# Patient Record
Sex: Female | Born: 1950 | Race: Black or African American | Hispanic: No | Marital: Single | Smoking: Former smoker
Health system: Southern US, Community
[De-identification: ages and names within clinical notes are randomized; demographics above are authoritative.]

## PROBLEM LIST (undated history)

## (undated) DIAGNOSIS — I10 Essential (primary) hypertension: Secondary | ICD-10-CM

## (undated) DIAGNOSIS — Z9221 Personal history of antineoplastic chemotherapy: Secondary | ICD-10-CM

## (undated) DIAGNOSIS — E7800 Pure hypercholesterolemia, unspecified: Secondary | ICD-10-CM

## (undated) DIAGNOSIS — F32A Depression, unspecified: Secondary | ICD-10-CM

## (undated) DIAGNOSIS — I509 Heart failure, unspecified: Secondary | ICD-10-CM

## (undated) DIAGNOSIS — I2510 Atherosclerotic heart disease of native coronary artery without angina pectoris: Secondary | ICD-10-CM

## (undated) DIAGNOSIS — E669 Obesity, unspecified: Secondary | ICD-10-CM

## (undated) DIAGNOSIS — C189 Malignant neoplasm of colon, unspecified: Secondary | ICD-10-CM

## (undated) DIAGNOSIS — F329 Major depressive disorder, single episode, unspecified: Secondary | ICD-10-CM

## (undated) DIAGNOSIS — E119 Type 2 diabetes mellitus without complications: Secondary | ICD-10-CM

## (undated) HISTORY — DX: Essential (primary) hypertension: I10

## (undated) HISTORY — DX: Malignant neoplasm of colon, unspecified: C18.9

## (undated) HISTORY — PX: ABDOMINAL HYSTERECTOMY: SHX81

## (undated) HISTORY — DX: Pure hypercholesterolemia, unspecified: E78.00

## (undated) HISTORY — DX: Heart failure, unspecified: I50.9

## (undated) HISTORY — PX: COLECTOMY: SHX59

## (undated) HISTORY — PX: UPPER GI ENDOSCOPY: SHX6162

## (undated) HISTORY — DX: Type 2 diabetes mellitus without complications: E11.9

---

## 2001-04-02 ENCOUNTER — Ambulatory Visit (HOSPITAL_COMMUNITY): Admission: RE | Admit: 2001-04-02 | Discharge: 2001-04-02 | Payer: Self-pay | Admitting: Family Medicine

## 2007-05-30 DIAGNOSIS — Z9221 Personal history of antineoplastic chemotherapy: Secondary | ICD-10-CM

## 2007-05-30 DIAGNOSIS — C189 Malignant neoplasm of colon, unspecified: Secondary | ICD-10-CM

## 2007-05-30 HISTORY — DX: Personal history of antineoplastic chemotherapy: Z92.21

## 2007-05-30 HISTORY — DX: Malignant neoplasm of colon, unspecified: C18.9

## 2007-09-27 ENCOUNTER — Ambulatory Visit: Payer: Self-pay | Admitting: Internal Medicine

## 2007-10-15 ENCOUNTER — Other Ambulatory Visit: Payer: Self-pay

## 2007-10-15 ENCOUNTER — Inpatient Hospital Stay: Payer: Self-pay | Admitting: Vascular Surgery

## 2007-10-28 ENCOUNTER — Ambulatory Visit: Payer: Self-pay | Admitting: Internal Medicine

## 2007-11-06 ENCOUNTER — Ambulatory Visit: Payer: Self-pay | Admitting: Internal Medicine

## 2007-11-18 ENCOUNTER — Ambulatory Visit: Payer: Self-pay | Admitting: Vascular Surgery

## 2007-11-27 ENCOUNTER — Ambulatory Visit: Payer: Self-pay | Admitting: Internal Medicine

## 2007-12-28 ENCOUNTER — Ambulatory Visit: Payer: Self-pay | Admitting: Internal Medicine

## 2008-01-08 ENCOUNTER — Emergency Department: Payer: Self-pay | Admitting: Emergency Medicine

## 2008-01-28 ENCOUNTER — Ambulatory Visit: Payer: Self-pay | Admitting: Internal Medicine

## 2008-02-27 ENCOUNTER — Ambulatory Visit: Payer: Self-pay | Admitting: Internal Medicine

## 2008-03-29 ENCOUNTER — Ambulatory Visit: Payer: Self-pay | Admitting: Internal Medicine

## 2008-04-28 ENCOUNTER — Ambulatory Visit: Payer: Self-pay | Admitting: Internal Medicine

## 2008-05-29 ENCOUNTER — Ambulatory Visit: Payer: Self-pay | Admitting: Internal Medicine

## 2008-06-29 ENCOUNTER — Ambulatory Visit: Payer: Self-pay | Admitting: Internal Medicine

## 2008-07-27 ENCOUNTER — Ambulatory Visit: Payer: Self-pay | Admitting: Internal Medicine

## 2008-07-28 ENCOUNTER — Ambulatory Visit: Payer: Self-pay | Admitting: Vascular Surgery

## 2008-08-04 ENCOUNTER — Inpatient Hospital Stay: Payer: Self-pay | Admitting: Vascular Surgery

## 2008-08-27 ENCOUNTER — Ambulatory Visit: Payer: Self-pay | Admitting: Internal Medicine

## 2008-09-09 ENCOUNTER — Ambulatory Visit: Payer: Self-pay | Admitting: Family Medicine

## 2008-09-21 ENCOUNTER — Ambulatory Visit: Payer: Self-pay | Admitting: Internal Medicine

## 2008-09-26 ENCOUNTER — Ambulatory Visit: Payer: Self-pay | Admitting: Family Medicine

## 2008-09-26 ENCOUNTER — Ambulatory Visit: Payer: Self-pay | Admitting: Internal Medicine

## 2008-10-27 ENCOUNTER — Ambulatory Visit: Payer: Self-pay | Admitting: Internal Medicine

## 2008-11-26 ENCOUNTER — Ambulatory Visit: Payer: Self-pay | Admitting: Internal Medicine

## 2008-12-03 ENCOUNTER — Ambulatory Visit: Payer: Self-pay | Admitting: Internal Medicine

## 2008-12-27 ENCOUNTER — Ambulatory Visit: Payer: Self-pay | Admitting: Internal Medicine

## 2009-01-27 ENCOUNTER — Ambulatory Visit: Payer: Self-pay | Admitting: Internal Medicine

## 2009-02-26 ENCOUNTER — Emergency Department: Payer: Self-pay

## 2009-02-26 ENCOUNTER — Ambulatory Visit: Payer: Self-pay | Admitting: Internal Medicine

## 2009-03-15 ENCOUNTER — Emergency Department: Payer: Self-pay | Admitting: Emergency Medicine

## 2009-03-29 ENCOUNTER — Ambulatory Visit: Payer: Self-pay | Admitting: Internal Medicine

## 2009-04-28 ENCOUNTER — Ambulatory Visit: Payer: Self-pay | Admitting: Internal Medicine

## 2009-06-01 ENCOUNTER — Ambulatory Visit: Payer: Self-pay | Admitting: Internal Medicine

## 2009-06-23 ENCOUNTER — Ambulatory Visit: Payer: Self-pay | Admitting: Gastroenterology

## 2009-06-29 ENCOUNTER — Ambulatory Visit: Payer: Self-pay | Admitting: Internal Medicine

## 2009-07-13 ENCOUNTER — Ambulatory Visit: Payer: Self-pay

## 2009-07-27 ENCOUNTER — Ambulatory Visit: Payer: Self-pay | Admitting: Internal Medicine

## 2009-08-27 ENCOUNTER — Ambulatory Visit: Payer: Self-pay | Admitting: Internal Medicine

## 2009-09-26 ENCOUNTER — Ambulatory Visit: Payer: Self-pay | Admitting: Internal Medicine

## 2009-10-27 ENCOUNTER — Ambulatory Visit: Payer: Self-pay | Admitting: Internal Medicine

## 2009-11-26 ENCOUNTER — Ambulatory Visit: Payer: Self-pay | Admitting: Internal Medicine

## 2009-12-27 ENCOUNTER — Ambulatory Visit: Payer: Self-pay | Admitting: Internal Medicine

## 2010-01-19 LAB — CEA: CEA: 6.8 ng/mL — ABNORMAL HIGH (ref 0.0–4.7)

## 2010-01-27 ENCOUNTER — Ambulatory Visit: Payer: Self-pay | Admitting: Internal Medicine

## 2010-02-21 ENCOUNTER — Ambulatory Visit: Payer: Self-pay | Admitting: Internal Medicine

## 2010-02-23 LAB — CEA: CEA: 6.3 ng/mL — ABNORMAL HIGH (ref 0.0–4.7)

## 2010-02-26 ENCOUNTER — Ambulatory Visit: Payer: Self-pay | Admitting: Internal Medicine

## 2010-03-23 LAB — CEA: CEA: 7.2 ng/mL — ABNORMAL HIGH (ref 0.0–4.7)

## 2010-03-29 ENCOUNTER — Ambulatory Visit: Payer: Self-pay | Admitting: Internal Medicine

## 2010-04-28 ENCOUNTER — Ambulatory Visit: Payer: Self-pay | Admitting: Internal Medicine

## 2010-05-15 LAB — CEA: CEA: 7.2 ng/mL — ABNORMAL HIGH (ref 0.0–4.7)

## 2010-05-29 ENCOUNTER — Ambulatory Visit: Payer: Self-pay | Admitting: Internal Medicine

## 2010-06-29 ENCOUNTER — Ambulatory Visit: Payer: Self-pay | Admitting: Internal Medicine

## 2010-07-15 ENCOUNTER — Ambulatory Visit: Payer: Self-pay | Admitting: Internal Medicine

## 2010-07-16 LAB — CEA: CEA: 6.6 ng/mL — ABNORMAL HIGH (ref 0.0–4.7)

## 2010-07-18 ENCOUNTER — Ambulatory Visit: Payer: Self-pay | Admitting: Family Medicine

## 2010-07-28 ENCOUNTER — Ambulatory Visit: Payer: Self-pay | Admitting: Internal Medicine

## 2010-08-28 ENCOUNTER — Ambulatory Visit: Payer: Self-pay | Admitting: Internal Medicine

## 2010-09-11 LAB — CEA: CEA: 7.8 ng/mL — ABNORMAL HIGH (ref 0.0–4.7)

## 2010-09-27 ENCOUNTER — Ambulatory Visit: Payer: Self-pay | Admitting: Internal Medicine

## 2010-10-28 ENCOUNTER — Ambulatory Visit: Payer: Self-pay | Admitting: Internal Medicine

## 2010-10-31 ENCOUNTER — Ambulatory Visit: Payer: Self-pay | Admitting: Gastroenterology

## 2010-11-01 ENCOUNTER — Ambulatory Visit: Payer: Self-pay | Admitting: Internal Medicine

## 2010-11-27 ENCOUNTER — Ambulatory Visit: Payer: Self-pay | Admitting: Internal Medicine

## 2010-12-14 ENCOUNTER — Inpatient Hospital Stay: Payer: Self-pay | Admitting: Internal Medicine

## 2010-12-17 ENCOUNTER — Ambulatory Visit: Payer: Self-pay | Admitting: Internal Medicine

## 2010-12-28 ENCOUNTER — Ambulatory Visit: Payer: Self-pay | Admitting: Internal Medicine

## 2010-12-29 LAB — CEA: CEA: 7.9 ng/mL — ABNORMAL HIGH (ref 0.0–4.7)

## 2011-01-28 ENCOUNTER — Ambulatory Visit: Payer: Self-pay | Admitting: Internal Medicine

## 2011-02-09 LAB — CEA: CEA: 7.2 ng/mL — ABNORMAL HIGH (ref 0.0–4.7)

## 2011-02-27 ENCOUNTER — Ambulatory Visit: Payer: Self-pay | Admitting: Internal Medicine

## 2011-03-30 ENCOUNTER — Ambulatory Visit: Payer: Self-pay | Admitting: Internal Medicine

## 2011-04-14 LAB — CEA: CEA: 5.7 ng/mL — ABNORMAL HIGH (ref 0.0–4.7)

## 2011-04-29 ENCOUNTER — Ambulatory Visit: Payer: Self-pay | Admitting: Internal Medicine

## 2011-05-30 ENCOUNTER — Ambulatory Visit: Payer: Self-pay | Admitting: Internal Medicine

## 2011-06-12 LAB — POTASSIUM: Potassium: 3.5 mmol/L (ref 3.5–5.1)

## 2011-06-13 LAB — CEA: CEA: 5.8 ng/mL — ABNORMAL HIGH (ref 0.0–4.7)

## 2011-06-30 ENCOUNTER — Ambulatory Visit: Payer: Self-pay | Admitting: Internal Medicine

## 2011-07-17 LAB — CREATININE, SERUM
EGFR (African American): >60
EGFR (Non-African Amer.): >60

## 2011-07-17 LAB — HEPATIC FUNCTION PANEL A (ARMC)
Albumin: 3.6 g/dL (ref 3.4–5.0)
Bilirubin, Direct: <0.1 mg/dL (ref 0.00–0.20)
Bilirubin,Total: 0.4 mg/dL (ref 0.2–1.0)
SGOT(AST): 43 U/L — ABNORMAL HIGH (ref 15–37)
SGPT (ALT): 68 U/L

## 2011-07-28 ENCOUNTER — Ambulatory Visit: Payer: Self-pay | Admitting: Internal Medicine

## 2011-08-14 LAB — POTASSIUM: Potassium: 3.5 mmol/L (ref 3.5–5.1)

## 2011-08-25 ENCOUNTER — Inpatient Hospital Stay: Payer: Self-pay | Admitting: Specialist

## 2011-08-25 LAB — CBC
HCT: 33.1 % — ABNORMAL LOW (ref 35.0–47.0)
HGB: 11 g/dL — ABNORMAL LOW (ref 12.0–16.0)
MCH: 25.2 pg — ABNORMAL LOW (ref 26.0–34.0)
MCHC: 33.7 g/dL (ref 32.0–36.0)
MCHC: 33.2 g/dL (ref 32.0–36.0)
Platelet: 187 10*3/uL (ref 150–440)
RBC: 4.95 10*6/uL (ref 3.80–5.20)
RDW: 15.3 % — ABNORMAL HIGH (ref 11.5–14.5)
WBC: 5.4 10*3/uL (ref 3.6–11.0)

## 2011-08-25 LAB — COMPREHENSIVE METABOLIC PANEL
Albumin: 3.9 g/dL (ref 3.4–5.0)
Alkaline Phosphatase: 69 U/L (ref 50–136)
Anion Gap: 13 (ref 7–16)
Chloride: 105 mmol/L (ref 98–107)
Creatinine: 0.98 mg/dL (ref 0.60–1.30)
EGFR (African American): >60
EGFR (Non-African Amer.): >60
Glucose: 224 mg/dL — ABNORMAL HIGH (ref 65–99)
SGOT(AST): 50 U/L — ABNORMAL HIGH (ref 15–37)
Sodium: 143 mmol/L (ref 136–145)

## 2011-08-25 LAB — PROTIME-INR
INR: 0.8
Prothrombin Time: 11.6 secs (ref 11.5–14.7)

## 2011-08-26 LAB — CBC WITH DIFFERENTIAL/PLATELET
Eosinophil %: 0.4 %
HCT: 33.1 % — ABNORMAL LOW (ref 35.0–47.0)
Lymphocyte %: 27 %
MCH: 25.8 pg — ABNORMAL LOW (ref 26.0–34.0)
MCV: 79 fL — ABNORMAL LOW (ref 80–100)
Monocyte %: 6 %
Neutrophil #: 4.2 10*3/uL (ref 1.4–6.5)
Platelet: 158 10*3/uL (ref 150–440)
RBC: 4.2 10*6/uL (ref 3.80–5.20)
RDW: 16.8 % — ABNORMAL HIGH (ref 11.5–14.5)

## 2011-08-26 LAB — COMPREHENSIVE METABOLIC PANEL
Albumin: 3 g/dL — ABNORMAL LOW (ref 3.4–5.0)
Chloride: 108 mmol/L — ABNORMAL HIGH (ref 98–107)
Creatinine: 0.94 mg/dL (ref 0.60–1.30)
Glucose: 192 mg/dL — ABNORMAL HIGH (ref 65–99)
Osmolality: 294 (ref 275–301)
Potassium: 3.5 mmol/L (ref 3.5–5.1)
SGPT (ALT): 60 U/L
Sodium: 145 mmol/L (ref 136–145)

## 2011-08-26 LAB — CK TOTAL AND CKMB (NOT AT ARMC)
CK, Total: 85 U/L (ref 21–215)
CK-MB: 0.8 ng/mL (ref 0.5–3.6)

## 2011-08-26 LAB — MAGNESIUM: Magnesium: 1.5 mg/dL — ABNORMAL LOW

## 2011-08-26 LAB — TROPONIN I
Troponin-I: 0.02 ng/mL
Troponin-I: 0.02 ng/mL

## 2011-08-26 LAB — HEMOGLOBIN
HGB: 10.3 g/dL — ABNORMAL LOW (ref 12.0–16.0)
HGB: 10.6 g/dL — ABNORMAL LOW (ref 12.0–16.0)

## 2011-08-27 LAB — CBC WITH DIFFERENTIAL/PLATELET
Basophil %: 0.4 %
HCT: 34.3 % — ABNORMAL LOW (ref 35.0–47.0)
HGB: 11.4 g/dL — ABNORMAL LOW (ref 12.0–16.0)
MCV: 78 fL — ABNORMAL LOW (ref 80–100)
RBC: 4.38 10*6/uL (ref 3.80–5.20)
WBC: 6.6 10*3/uL (ref 3.6–11.0)

## 2011-08-28 ENCOUNTER — Ambulatory Visit: Payer: Self-pay | Admitting: Internal Medicine

## 2011-09-11 LAB — POTASSIUM: Potassium: 3.4 mmol/L — ABNORMAL LOW (ref 3.5–5.1)

## 2011-09-27 ENCOUNTER — Ambulatory Visit: Payer: Self-pay | Admitting: Internal Medicine

## 2011-09-29 LAB — IRON AND TIBC
Iron Bind.Cap.(Total): 381 ug/dL (ref 250–450)
Iron Saturation: 14 %
Iron: 52 ug/dL (ref 50–170)
Unbound Iron-Bind.Cap.: 329 ug/dL

## 2011-09-29 LAB — FERRITIN: Ferritin (ARMC): 73 ng/mL (ref 8–388)

## 2011-09-29 LAB — CREATININE, SERUM
EGFR (African American): >60
EGFR (Non-African Amer.): >60

## 2011-10-04 ENCOUNTER — Ambulatory Visit: Payer: Self-pay | Admitting: Family Medicine

## 2011-10-28 ENCOUNTER — Ambulatory Visit: Payer: Self-pay | Admitting: Internal Medicine

## 2011-11-09 ENCOUNTER — Ambulatory Visit: Payer: Self-pay | Admitting: Internal Medicine

## 2011-11-11 LAB — CEA: CEA: 10.6 ng/mL — ABNORMAL HIGH

## 2011-11-23 ENCOUNTER — Ambulatory Visit: Payer: Self-pay | Admitting: Internal Medicine

## 2011-11-27 ENCOUNTER — Ambulatory Visit: Payer: Self-pay | Admitting: Internal Medicine

## 2011-12-08 LAB — POTASSIUM: Potassium: 3.3 mmol/L — ABNORMAL LOW (ref 3.5–5.1)

## 2011-12-08 LAB — IRON AND TIBC
Iron Bind.Cap.(Total): 410 ug/dL (ref 250–450)
Iron Saturation: 11 %
Iron: 45 ug/dL — ABNORMAL LOW (ref 50–170)
Unbound Iron-Bind.Cap.: 365 ug/dL

## 2011-12-08 LAB — FERRITIN: Ferritin (ARMC): 57 ng/mL (ref 8–388)

## 2011-12-08 LAB — CANCER CENTER HEMOGLOBIN: HGB: 12.2 g/dL (ref 12.0–16.0)

## 2011-12-08 LAB — MAGNESIUM: Magnesium: 1.7 mg/dL — ABNORMAL LOW

## 2011-12-08 LAB — CREATININE, SERUM: EGFR (African American): >60

## 2011-12-13 LAB — OCCULT BLOOD X 1 CARD TO LAB, STOOL
Occult Blood, Feces: NEGATIVE
Occult Blood, Feces: NEGATIVE

## 2011-12-28 ENCOUNTER — Ambulatory Visit: Payer: Self-pay | Admitting: Internal Medicine

## 2012-01-28 ENCOUNTER — Ambulatory Visit: Payer: Self-pay | Admitting: Internal Medicine

## 2012-02-22 LAB — CBC CANCER CENTER
Basophil %: 0.7 %
Eosinophil #: 0 x10 3/mm (ref 0.0–0.7)
Eosinophil %: 0.9 %
HCT: 36.6 % (ref 35.0–47.0)
HGB: 11.5 g/dL — ABNORMAL LOW (ref 12.0–16.0)
Lymphocyte %: 31.6 %
MCHC: 31.5 g/dL — ABNORMAL LOW (ref 32.0–36.0)
MCV: 75 fL — ABNORMAL LOW (ref 80–100)
Monocyte #: 0.3 x10 3/mm (ref 0.2–0.9)
Monocyte %: 6.9 %
Neutrophil #: 2.3 x10 3/mm (ref 1.4–6.5)
Neutrophil %: 59.9 %
Platelet: 151 x10 3/mm (ref 150–440)
RBC: 4.89 10*6/uL (ref 3.80–5.20)

## 2012-02-23 LAB — CEA: CEA: 5.9 ng/mL — ABNORMAL HIGH (ref 0.0–4.7)

## 2012-02-27 ENCOUNTER — Ambulatory Visit: Payer: Self-pay | Admitting: Internal Medicine

## 2012-03-21 LAB — CANCER CENTER HEMOGLOBIN: HGB: 11.8 g/dL — ABNORMAL LOW (ref 12.0–16.0)

## 2012-03-21 LAB — OCCULT BLOOD X 1 CARD TO LAB, STOOL
Occult Blood, Feces: NEGATIVE
Occult Blood, Feces: NEGATIVE
Occult Blood, Feces: NEGATIVE

## 2012-03-29 ENCOUNTER — Ambulatory Visit: Payer: Self-pay | Admitting: Internal Medicine

## 2012-04-18 LAB — BASIC METABOLIC PANEL
Anion Gap: 15 (ref 7–16)
Calcium, Total: 8.9 mg/dL (ref 8.5–10.1)
EGFR (Non-African Amer.): 49 — ABNORMAL LOW
Osmolality: 294 (ref 275–301)
Sodium: 144 mmol/L (ref 136–145)

## 2012-04-18 LAB — CANCER CENTER HEMOGLOBIN: HGB: 12 g/dL (ref 12.0–16.0)

## 2012-04-18 LAB — MAGNESIUM: Magnesium: 1.9 mg/dL

## 2012-04-19 LAB — CEA: CEA: 6.2 ng/mL — ABNORMAL HIGH (ref 0.0–4.7)

## 2012-04-22 LAB — CA 125: CA 125: 5.6 U/mL (ref 0.0–34.0)

## 2012-04-28 ENCOUNTER — Ambulatory Visit: Payer: Self-pay | Admitting: Internal Medicine

## 2012-05-29 ENCOUNTER — Ambulatory Visit: Payer: Self-pay | Admitting: Internal Medicine

## 2012-06-14 LAB — CEA: CEA: 7 ng/mL — ABNORMAL HIGH (ref 0.0–4.7)

## 2012-06-17 ENCOUNTER — Ambulatory Visit: Payer: Self-pay | Admitting: Internal Medicine

## 2012-06-29 ENCOUNTER — Ambulatory Visit: Payer: Self-pay | Admitting: Internal Medicine

## 2012-07-25 LAB — CBC CANCER CENTER
Basophil #: 0 x10 3/mm (ref 0.0–0.1)
Basophil %: 0.9 %
Eosinophil #: 0.1 x10 3/mm (ref 0.0–0.7)
Lymphocyte %: 28.4 %
MCV: 76 fL — ABNORMAL LOW (ref 80–100)
Neutrophil #: 3.4 x10 3/mm (ref 1.4–6.5)
Neutrophil %: 63.3 %
Platelet: 155 x10 3/mm (ref 150–440)

## 2012-07-25 LAB — CREATININE, SERUM
Creatinine: 1.36 mg/dL — ABNORMAL HIGH (ref 0.60–1.30)
EGFR (African American): 49 — ABNORMAL LOW
EGFR (Non-African Amer.): 42 — ABNORMAL LOW

## 2012-07-25 LAB — FERRITIN: Ferritin (ARMC): 67 ng/mL (ref 8–388)

## 2012-07-25 LAB — IRON AND TIBC: Iron Saturation: 12 %

## 2012-07-26 LAB — CA 125: CA 125: 5.5 U/mL (ref 0.0–34.0)

## 2012-07-27 ENCOUNTER — Ambulatory Visit: Payer: Self-pay | Admitting: Internal Medicine

## 2012-08-22 ENCOUNTER — Ambulatory Visit: Payer: Self-pay | Admitting: Gastroenterology

## 2012-08-23 LAB — PATHOLOGY REPORT

## 2012-08-27 ENCOUNTER — Ambulatory Visit: Payer: Self-pay | Admitting: Internal Medicine

## 2012-09-26 ENCOUNTER — Ambulatory Visit: Payer: Self-pay | Admitting: Internal Medicine

## 2012-10-04 ENCOUNTER — Ambulatory Visit: Payer: Self-pay | Admitting: Internal Medicine

## 2012-10-27 ENCOUNTER — Ambulatory Visit: Payer: Self-pay | Admitting: Internal Medicine

## 2012-11-25 LAB — CA 125: CA 125: 4.9 U/mL (ref 0.0–34.0)

## 2012-11-26 ENCOUNTER — Ambulatory Visit: Payer: Self-pay | Admitting: Internal Medicine

## 2013-01-03 ENCOUNTER — Ambulatory Visit: Payer: Self-pay | Admitting: Internal Medicine

## 2013-01-06 LAB — CANCER CENTER HEMOGLOBIN: HGB: 11.7 g/dL — ABNORMAL LOW (ref 12.0–16.0)

## 2013-01-07 LAB — CEA: CEA: 5.3 ng/mL — ABNORMAL HIGH (ref 0.0–4.7)

## 2013-01-27 ENCOUNTER — Ambulatory Visit: Payer: Self-pay | Admitting: Internal Medicine

## 2013-02-04 LAB — CBC
HCT: 38.4 % (ref 35.0–47.0)
HGB: 12.5 g/dL (ref 12.0–16.0)
MCH: 24.1 pg — ABNORMAL LOW (ref 26.0–34.0)
MCHC: 32.5 g/dL (ref 32.0–36.0)
MCV: 74 fL — ABNORMAL LOW (ref 80–100)
Platelet: 176 10*3/uL (ref 150–440)
WBC: 6.1 10*3/uL (ref 3.6–11.0)

## 2013-02-04 LAB — COMPREHENSIVE METABOLIC PANEL
Albumin: 3.5 g/dL (ref 3.4–5.0)
Alkaline Phosphatase: 73 U/L (ref 50–136)
Anion Gap: 8 (ref 7–16)
BUN: 15 mg/dL (ref 7–18)
Calcium, Total: 9.3 mg/dL (ref 8.5–10.1)
Chloride: 106 mmol/L (ref 98–107)
Co2: 26 mmol/L (ref 21–32)
Creatinine: 0.95 mg/dL (ref 0.60–1.30)
Glucose: 157 mg/dL — ABNORMAL HIGH (ref 65–99)
Osmolality: 283 (ref 275–301)
SGOT(AST): 63 U/L — ABNORMAL HIGH (ref 15–37)
SGPT (ALT): 89 U/L — ABNORMAL HIGH (ref 12–78)
Total Protein: 7.4 g/dL (ref 6.4–8.2)

## 2013-02-05 ENCOUNTER — Inpatient Hospital Stay: Payer: Self-pay | Admitting: Internal Medicine

## 2013-02-05 LAB — CK-MB
CK-MB: 1.1 ng/mL (ref 0.5–3.6)
CK-MB: 1.2 ng/mL (ref 0.5–3.6)
CK-MB: 1.2 ng/mL (ref 0.5–3.6)

## 2013-02-05 LAB — TROPONIN I: Troponin-I: <0.02 ng/mL

## 2013-02-06 LAB — COMPREHENSIVE METABOLIC PANEL
Albumin: 3.2 g/dL — ABNORMAL LOW (ref 3.4–5.0)
Alkaline Phosphatase: 65 U/L (ref 50–136)
Anion Gap: 8 (ref 7–16)
Bilirubin,Total: 0.4 mg/dL (ref 0.2–1.0)
Calcium, Total: 8.8 mg/dL (ref 8.5–10.1)
Co2: 29 mmol/L (ref 21–32)
Creatinine: 1.12 mg/dL (ref 0.60–1.30)
EGFR (African American): >60
EGFR (Non-African Amer.): 53 — ABNORMAL LOW
Glucose: 151 mg/dL — ABNORMAL HIGH (ref 65–99)
Osmolality: 284 (ref 275–301)
Potassium: 3.2 mmol/L — ABNORMAL LOW (ref 3.5–5.1)
SGOT(AST): 56 U/L — ABNORMAL HIGH (ref 15–37)
SGPT (ALT): 82 U/L — ABNORMAL HIGH (ref 12–78)
Sodium: 140 mmol/L (ref 136–145)
Total Protein: 7.3 g/dL (ref 6.4–8.2)

## 2013-02-06 LAB — PROTIME-INR
INR: 1
Prothrombin Time: 12.9 secs (ref 11.5–14.7)

## 2013-02-06 LAB — CBC WITH DIFFERENTIAL/PLATELET
Basophil %: 0.4 %
Eosinophil #: 0 10*3/uL (ref 0.0–0.7)
Eosinophil %: 1.2 %
HGB: 11.9 g/dL — ABNORMAL LOW (ref 12.0–16.0)
Lymphocyte %: 30.5 %
MCH: 24.2 pg — ABNORMAL LOW (ref 26.0–34.0)
MCHC: 32.9 g/dL (ref 32.0–36.0)
MCV: 74 fL — ABNORMAL LOW (ref 80–100)
Monocyte #: 0.3 x10 3/mm (ref 0.2–0.9)
Neutrophil #: 2.3 10*3/uL (ref 1.4–6.5)
Neutrophil %: 60.8 %
Platelet: 161 10*3/uL (ref 150–440)
RBC: 4.94 10*6/uL (ref 3.80–5.20)
WBC: 3.9 10*3/uL (ref 3.6–11.0)

## 2013-02-06 LAB — LIPID PANEL
Cholesterol: 178 mg/dL (ref 0–200)
HDL Cholesterol: 47 mg/dL (ref 40–60)
Triglycerides: 200 mg/dL (ref 0–200)

## 2013-02-07 LAB — CBC WITH DIFFERENTIAL/PLATELET
Basophil %: 0.5 %
Lymphocyte #: 1.4 10*3/uL (ref 1.0–3.6)
MCHC: 33 g/dL (ref 32.0–36.0)
Neutrophil #: 2.4 10*3/uL (ref 1.4–6.5)
Neutrophil %: 58.1 %
Platelet: 194 10*3/uL (ref 150–440)
RBC: 5.15 10*6/uL (ref 3.80–5.20)
RDW: 16.4 % — ABNORMAL HIGH (ref 11.5–14.5)
WBC: 4.1 10*3/uL (ref 3.6–11.0)

## 2013-02-07 LAB — BASIC METABOLIC PANEL
Chloride: 102 mmol/L (ref 98–107)
EGFR (African American): >60
Glucose: 145 mg/dL — ABNORMAL HIGH (ref 65–99)
Potassium: 3.4 mmol/L — ABNORMAL LOW (ref 3.5–5.1)

## 2013-02-26 ENCOUNTER — Ambulatory Visit: Payer: Self-pay | Admitting: Internal Medicine

## 2013-03-19 ENCOUNTER — Ambulatory Visit: Payer: Self-pay | Admitting: Cardiology

## 2013-03-31 ENCOUNTER — Ambulatory Visit: Payer: Self-pay | Admitting: Internal Medicine

## 2013-03-31 LAB — HEPATIC FUNCTION PANEL A (ARMC)
Albumin: 3.3 g/dL — ABNORMAL LOW (ref 3.4–5.0)
Bilirubin,Total: 0.3 mg/dL (ref 0.2–1.0)
SGOT(AST): 30 U/L (ref 15–37)
SGPT (ALT): 56 U/L (ref 12–78)
Total Protein: 7.2 g/dL (ref 6.4–8.2)

## 2013-03-31 LAB — CANCER CENTER HEMOGLOBIN: HGB: 11.5 g/dL — ABNORMAL LOW (ref 12.0–16.0)

## 2013-04-28 ENCOUNTER — Ambulatory Visit: Payer: Self-pay | Admitting: Internal Medicine

## 2013-05-12 LAB — HEPATIC FUNCTION PANEL A (ARMC)
Alkaline Phosphatase: 57 U/L
Bilirubin, Direct: 0.1 mg/dL (ref 0.00–0.20)
Bilirubin,Total: 0.2 mg/dL (ref 0.2–1.0)
SGOT(AST): 54 U/L — ABNORMAL HIGH (ref 15–37)
SGPT (ALT): 89 U/L — ABNORMAL HIGH (ref 12–78)

## 2013-05-12 LAB — CBC CANCER CENTER
Basophil %: 0.3 %
Eosinophil #: 0 x10 3/mm (ref 0.0–0.7)
HCT: 37 % (ref 35.0–47.0)
Lymphocyte #: 1.3 x10 3/mm (ref 1.0–3.6)
Lymphocyte %: 30.7 %
MCHC: 31.6 g/dL — ABNORMAL LOW (ref 32.0–36.0)
MCV: 76 fL — ABNORMAL LOW (ref 80–100)
Monocyte #: 0.2 x10 3/mm (ref 0.2–0.9)
Monocyte %: 5 %
Neutrophil %: 63.3 %
RBC: 4.88 10*6/uL (ref 3.80–5.20)
RDW: 16.7 % — ABNORMAL HIGH (ref 11.5–14.5)
WBC: 4.1 x10 3/mm (ref 3.6–11.0)

## 2013-05-13 LAB — CA 125: CA 125: 4.9 U/mL (ref 0.0–34.0)

## 2013-05-26 ENCOUNTER — Inpatient Hospital Stay: Payer: Self-pay | Admitting: Internal Medicine

## 2013-05-26 LAB — URINALYSIS, COMPLETE
Bilirubin,UR: NEGATIVE
Glucose,UR: 150 mg/dL (ref 0–75)
Ketone: NEGATIVE
Leukocyte Esterase: NEGATIVE
Ph: 8 (ref 4.5–8.0)
Specific Gravity: 1.011 (ref 1.003–1.030)
WBC UR: 4 /HPF (ref 0–5)

## 2013-05-26 LAB — COMPREHENSIVE METABOLIC PANEL
Albumin: 3.8 g/dL (ref 3.4–5.0)
Alkaline Phosphatase: 76 U/L
BUN: 15 mg/dL (ref 7–18)
Bilirubin,Total: 0.4 mg/dL (ref 0.2–1.0)
Chloride: 103 mmol/L (ref 98–107)
Co2: 29 mmol/L (ref 21–32)
Creatinine: 1.04 mg/dL (ref 0.60–1.30)
EGFR (African American): >60
Osmolality: 279 (ref 275–301)
SGOT(AST): 68 U/L — ABNORMAL HIGH (ref 15–37)
SGPT (ALT): 101 U/L — ABNORMAL HIGH (ref 12–78)
Sodium: 138 mmol/L (ref 136–145)

## 2013-05-26 LAB — MAGNESIUM
Magnesium: 1.4 mg/dL — ABNORMAL LOW
Magnesium: 2 mg/dL

## 2013-05-26 LAB — TROPONIN I
Troponin-I: <0.02 ng/mL
Troponin-I: <0.02 ng/mL
Troponin-I: <0.02 ng/mL
Troponin-I: 0.02 ng/mL

## 2013-05-26 LAB — CBC
HCT: 43.4 % (ref 35.0–47.0)
HGB: 14.2 g/dL (ref 12.0–16.0)
MCH: 24.4 pg — ABNORMAL LOW (ref 26.0–34.0)
MCHC: 32.6 g/dL (ref 32.0–36.0)
Platelet: 196 10*3/uL (ref 150–440)
RDW: 16.2 % — ABNORMAL HIGH (ref 11.5–14.5)
WBC: 7.2 10*3/uL (ref 3.6–11.0)

## 2013-05-26 LAB — CK-MB
CK-MB: 1.8 ng/mL (ref 0.5–3.6)
CK-MB: 1.9 ng/mL (ref 0.5–3.6)

## 2013-05-26 LAB — CK TOTAL AND CKMB (NOT AT ARMC): CK-MB: 2.5 ng/mL (ref 0.5–3.6)

## 2013-05-26 LAB — POTASSIUM: Potassium: 3.4 mmol/L — ABNORMAL LOW (ref 3.5–5.1)

## 2013-05-26 LAB — LIPASE, BLOOD: Lipase: 237 U/L (ref 73–393)

## 2013-05-27 LAB — CBC WITH DIFFERENTIAL/PLATELET
Basophil #: 0 10*3/uL (ref 0.0–0.1)
Basophil %: 0.4 %
Eosinophil #: 0.1 10*3/uL (ref 0.0–0.7)
Eosinophil %: 0.8 %
HCT: 42.4 % (ref 35.0–47.0)
HGB: 13.8 g/dL (ref 12.0–16.0)
Lymphocyte #: 1.5 10*3/uL (ref 1.0–3.6)
MCH: 24.4 pg — ABNORMAL LOW (ref 26.0–34.0)
MCV: 75 fL — ABNORMAL LOW (ref 80–100)
Monocyte #: 0.4 x10 3/mm (ref 0.2–0.9)
Monocyte %: 6.5 %
Neutrophil %: 67.3 %
Platelet: 189 10*3/uL (ref 150–440)
RBC: 5.66 10*6/uL — ABNORMAL HIGH (ref 3.80–5.20)
RDW: 16.4 % — ABNORMAL HIGH (ref 11.5–14.5)

## 2013-05-27 LAB — BASIC METABOLIC PANEL
BUN: 15 mg/dL (ref 7–18)
Chloride: 100 mmol/L (ref 98–107)
Co2: 27 mmol/L (ref 21–32)
Creatinine: 1.01 mg/dL (ref 0.60–1.30)
EGFR (Non-African Amer.): >60
Potassium: 3.6 mmol/L (ref 3.5–5.1)
Sodium: 134 mmol/L — ABNORMAL LOW (ref 136–145)

## 2013-05-27 LAB — HEMOGLOBIN A1C: Hemoglobin A1C: 7.4 % — ABNORMAL HIGH (ref 4.2–6.3)

## 2013-05-28 LAB — BASIC METABOLIC PANEL
Anion Gap: 10 (ref 7–16)
BUN: 30 mg/dL — ABNORMAL HIGH (ref 7–18)
Chloride: 99 mmol/L (ref 98–107)
Co2: 25 mmol/L (ref 21–32)
Creatinine: 1.4 mg/dL — ABNORMAL HIGH (ref 0.60–1.30)
Glucose: 222 mg/dL — ABNORMAL HIGH (ref 65–99)
Osmolality: 281 (ref 275–301)
Potassium: 3.5 mmol/L (ref 3.5–5.1)
Sodium: 134 mmol/L — ABNORMAL LOW (ref 136–145)

## 2013-05-28 LAB — CBC WITH DIFFERENTIAL/PLATELET
Eosinophil %: 1.4 %
HCT: 43.1 % (ref 35.0–47.0)
HGB: 14 g/dL (ref 12.0–16.0)
Lymphocyte #: 1.7 10*3/uL (ref 1.0–3.6)
Lymphocyte %: 33.5 %
MCH: 24.2 pg — ABNORMAL LOW (ref 26.0–34.0)
Monocyte %: 6.4 %
Neutrophil #: 3 10*3/uL (ref 1.4–6.5)
Neutrophil %: 57.9 %
Platelet: 208 10*3/uL (ref 150–440)
WBC: 5.1 10*3/uL (ref 3.6–11.0)

## 2013-05-28 LAB — HEPATIC FUNCTION PANEL A (ARMC)
SGOT(AST): 107 U/L — ABNORMAL HIGH (ref 15–37)
Total Protein: 7.8 g/dL (ref 6.4–8.2)

## 2013-05-29 ENCOUNTER — Ambulatory Visit: Payer: Self-pay | Admitting: Internal Medicine

## 2013-06-23 LAB — HEPATIC FUNCTION PANEL A (ARMC)
AST: 39 U/L — AB (ref 15–37)
Albumin: 3.4 g/dL (ref 3.4–5.0)
Alkaline Phosphatase: 65 U/L
Bilirubin, Direct: 0.1 mg/dL (ref 0.00–0.20)
Bilirubin,Total: 0.2 mg/dL (ref 0.2–1.0)
SGPT (ALT): 83 U/L — ABNORMAL HIGH (ref 12–78)
Total Protein: 7.2 g/dL (ref 6.4–8.2)

## 2013-06-29 ENCOUNTER — Ambulatory Visit: Payer: Self-pay | Admitting: Internal Medicine

## 2013-08-01 ENCOUNTER — Ambulatory Visit: Payer: Self-pay | Admitting: Internal Medicine

## 2013-08-04 LAB — CANCER CENTER HEMOGLOBIN: HGB: 12 g/dL (ref 12.0–16.0)

## 2013-08-05 LAB — CA 125: CA 125: 3.9 U/mL (ref 0.0–34.0)

## 2013-08-05 LAB — CEA: CEA: 5.5 ng/mL — ABNORMAL HIGH (ref 0.0–4.7)

## 2013-08-18 ENCOUNTER — Other Ambulatory Visit (HOSPITAL_COMMUNITY): Payer: Self-pay | Admitting: Internal Medicine

## 2013-08-27 ENCOUNTER — Ambulatory Visit: Payer: Self-pay | Admitting: Internal Medicine

## 2013-09-26 ENCOUNTER — Ambulatory Visit: Payer: Self-pay | Admitting: Internal Medicine

## 2013-10-06 ENCOUNTER — Ambulatory Visit: Payer: Self-pay | Admitting: Internal Medicine

## 2013-10-27 ENCOUNTER — Ambulatory Visit: Payer: Self-pay | Admitting: Internal Medicine

## 2013-10-28 LAB — CEA: CEA: 5.6 ng/mL — AB (ref 0.0–4.7)

## 2013-10-28 LAB — CA 125: CA 125: 4.1 U/mL (ref 0.0–34.0)

## 2013-11-26 ENCOUNTER — Ambulatory Visit: Payer: Self-pay | Admitting: Internal Medicine

## 2013-12-08 LAB — CBC CANCER CENTER
BASOS PCT: 0.5 %
Basophil #: 0 x10 3/mm (ref 0.0–0.1)
Eosinophil #: 0 x10 3/mm (ref 0.0–0.7)
Eosinophil %: 0.7 %
HCT: 39.2 % (ref 35.0–47.0)
HGB: 12.4 g/dL (ref 12.0–16.0)
Lymphocyte #: 1.2 x10 3/mm (ref 1.0–3.6)
Lymphocyte %: 23.9 %
MCH: 24.5 pg — ABNORMAL LOW (ref 26.0–34.0)
MCHC: 31.5 g/dL — AB (ref 32.0–36.0)
MCV: 78 fL — AB (ref 80–100)
MONOS PCT: 6 %
Monocyte #: 0.3 x10 3/mm (ref 0.2–0.9)
NEUTROS PCT: 68.9 %
Neutrophil #: 3.4 x10 3/mm (ref 1.4–6.5)
Platelet: 169 x10 3/mm (ref 150–440)
RBC: 5.04 10*6/uL (ref 3.80–5.20)
RDW: 16.6 % — ABNORMAL HIGH (ref 11.5–14.5)
WBC: 4.9 x10 3/mm (ref 3.6–11.0)

## 2013-12-08 LAB — IRON AND TIBC
IRON: 40 ug/dL — AB (ref 50–170)
Iron Bind.Cap.(Total): 352 ug/dL (ref 250–450)
Iron Saturation: 11 %
Unbound Iron-Bind.Cap.: 312 ug/dL

## 2013-12-08 LAB — FERRITIN: FERRITIN (ARMC): 101 ng/mL (ref 8–388)

## 2013-12-10 DIAGNOSIS — E669 Obesity, unspecified: Secondary | ICD-10-CM | POA: Insufficient documentation

## 2013-12-10 DIAGNOSIS — E119 Type 2 diabetes mellitus without complications: Secondary | ICD-10-CM | POA: Insufficient documentation

## 2013-12-27 ENCOUNTER — Other Ambulatory Visit (HOSPITAL_COMMUNITY): Payer: Self-pay | Admitting: Internal Medicine

## 2013-12-27 ENCOUNTER — Ambulatory Visit: Payer: Self-pay | Admitting: Internal Medicine

## 2013-12-31 ENCOUNTER — Ambulatory Visit: Payer: Self-pay | Admitting: Cardiovascular Disease

## 2014-01-20 LAB — CEA: CEA: 5.5 ng/mL — ABNORMAL HIGH (ref 0.0–4.7)

## 2014-01-20 LAB — CA 125: CA 125: 4.2 U/mL (ref 0.0–34.0)

## 2014-01-26 LAB — OCCULT BLOOD X 1 CARD TO LAB, STOOL
OCCULT BLOOD, FECES: NEGATIVE
Occult Blood, Feces: NEGATIVE
Occult Blood, Feces: NEGATIVE

## 2014-01-27 ENCOUNTER — Ambulatory Visit: Payer: Self-pay | Admitting: Internal Medicine

## 2014-03-02 ENCOUNTER — Ambulatory Visit: Payer: Self-pay | Admitting: Internal Medicine

## 2014-03-29 ENCOUNTER — Ambulatory Visit: Payer: Self-pay | Admitting: Internal Medicine

## 2014-04-13 LAB — CANCER CENTER HEMOGLOBIN: HGB: 11.6 g/dL — ABNORMAL LOW (ref 12.0–16.0)

## 2014-04-15 LAB — CEA: CEA: 5.3 ng/mL — ABNORMAL HIGH (ref 0.0–4.7)

## 2014-04-15 LAB — CA 125: CA 125: 5.5 U/mL (ref 0.0–34.0)

## 2014-04-28 ENCOUNTER — Ambulatory Visit: Payer: Self-pay | Admitting: Internal Medicine

## 2014-05-29 ENCOUNTER — Ambulatory Visit: Payer: Self-pay | Admitting: Internal Medicine

## 2014-07-06 ENCOUNTER — Ambulatory Visit: Payer: Self-pay | Admitting: Internal Medicine

## 2014-07-28 ENCOUNTER — Ambulatory Visit
Admit: 2014-07-28 | Disposition: A | Discharge status: Home / Self Care | Payer: Self-pay | Attending: Internal Medicine | Admitting: Internal Medicine

## 2014-08-28 ENCOUNTER — Ambulatory Visit
Admit: 2014-08-28 | Disposition: A | Discharge status: Home / Self Care | Payer: Self-pay | Attending: Internal Medicine | Admitting: Internal Medicine

## 2014-09-06 IMAGING — CT CT ABD-PELV W/O CM
1 of 2 series · 15 of 32 positions shown, 19 images · non-contrast
Comparison: none

REASON FOR EXAM: adnexal  masses  FU   ORAL CONTRAST ONLY
COMMENTS:

[Series 2: 3mm soft tissue · axial · 0.74mm/px · z∈[-950,-518]mm · 15 of 158 slices shown, 19 images]
[im 7/158  soft-tissue]
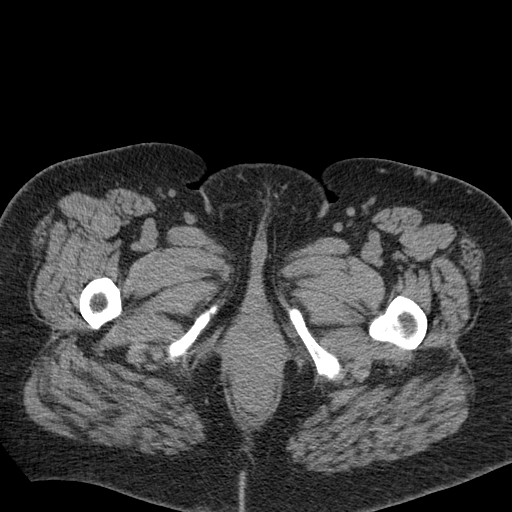
[im 7/158  bone]
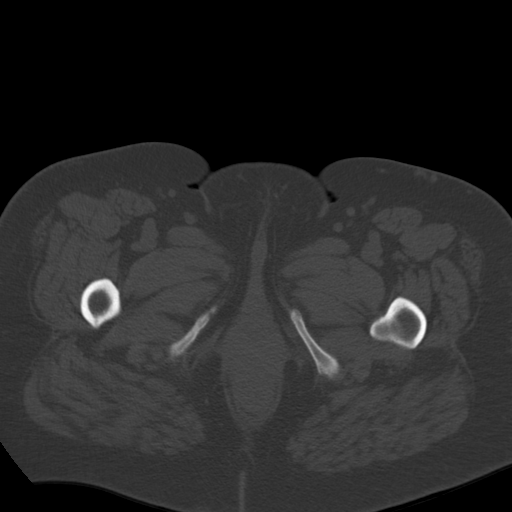
[im 21/158  soft-tissue]
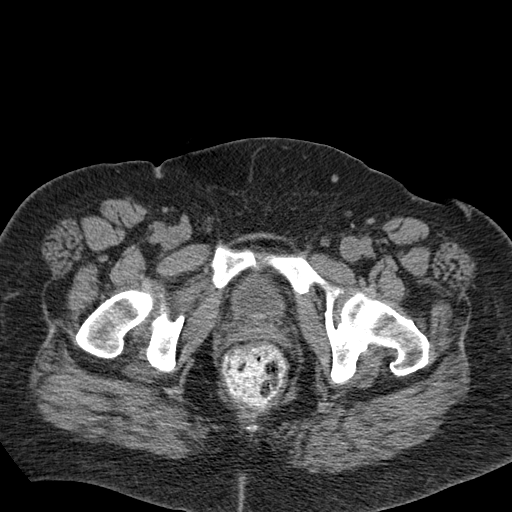
[im 35/158  soft-tissue]
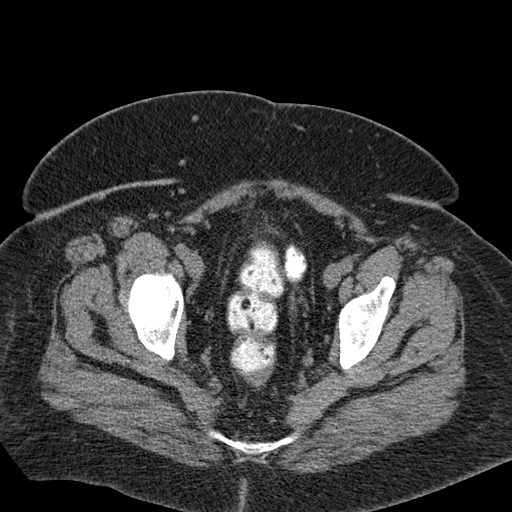
[im 41/158  soft-tissue]
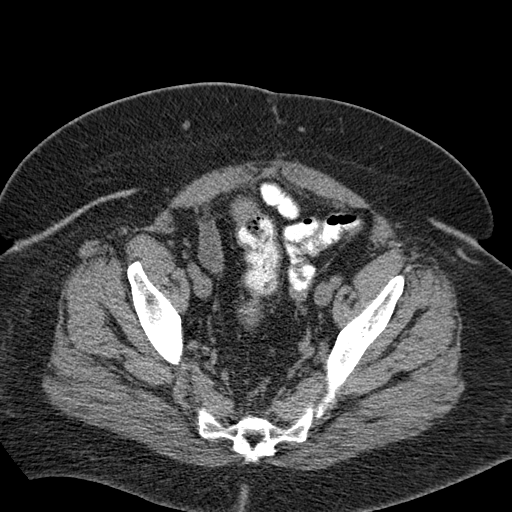
[im 55/158  soft-tissue]
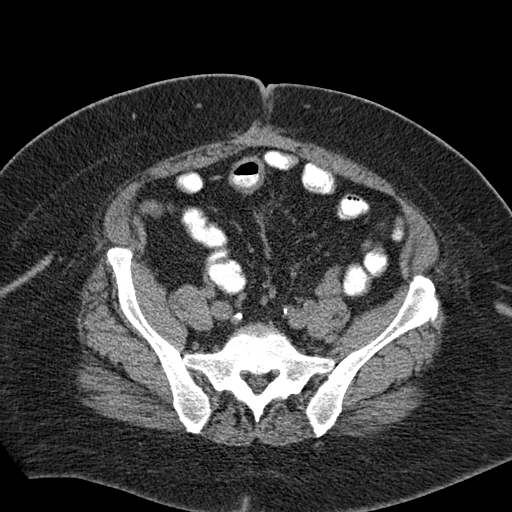
[im 69/158  soft-tissue]
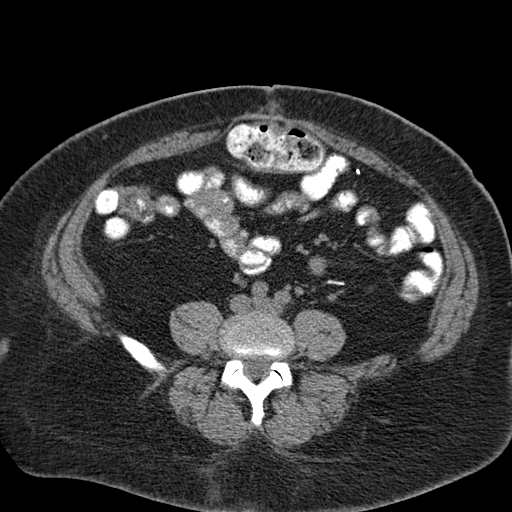
[im 82/158  soft-tissue]
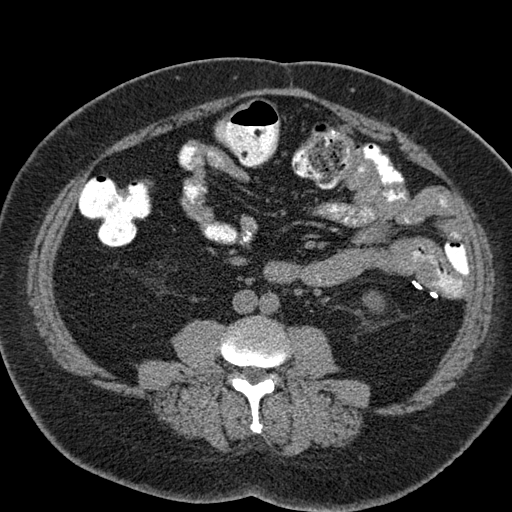
[im 89/158  soft-tissue]
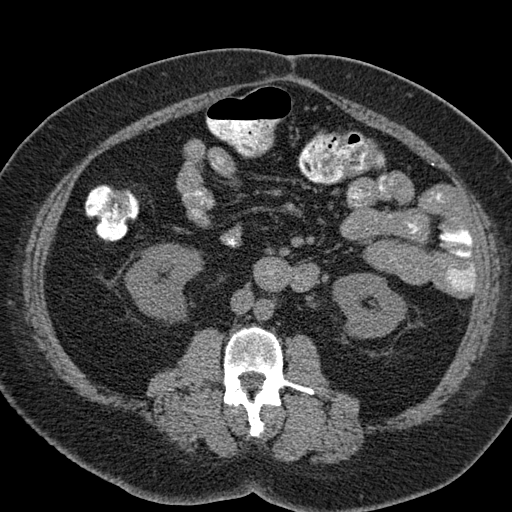
[im 103/158  soft-tissue]
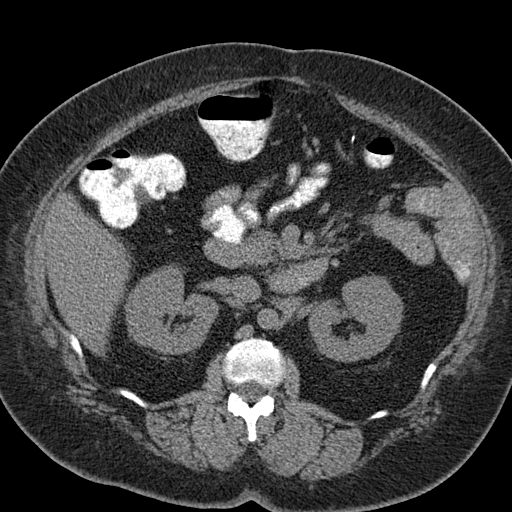
[im 103/158  bone]
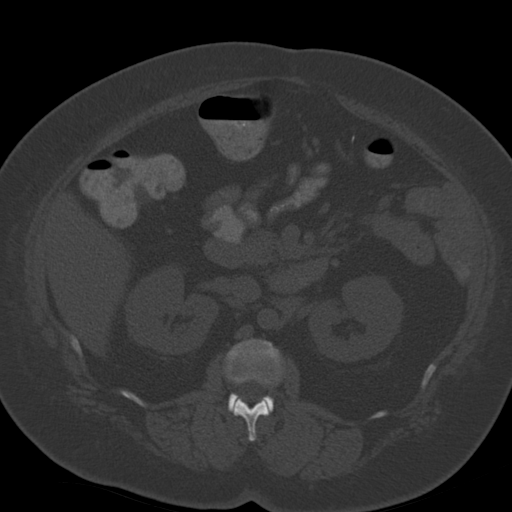
[im 117/158  soft-tissue]
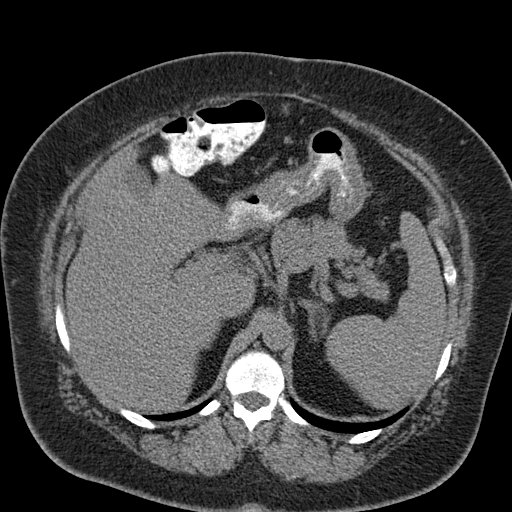
[im 123/158  soft-tissue]
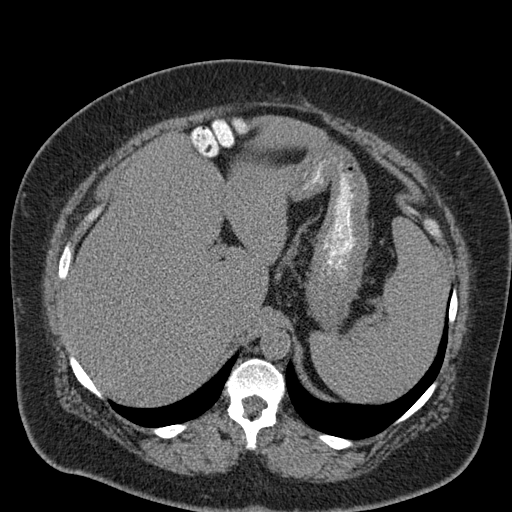
[im 130/158  lung]
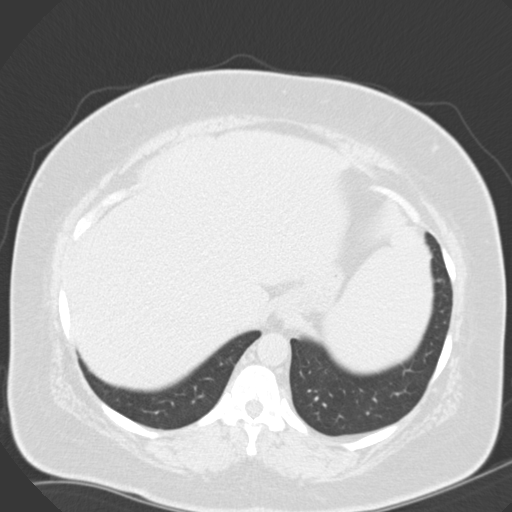
[im 137/158  soft-tissue]
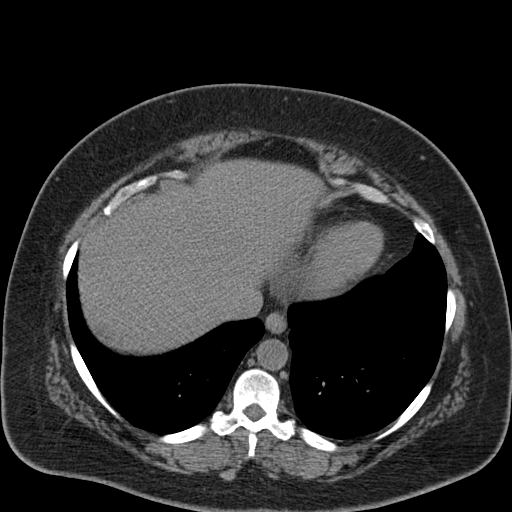
[im 137/158  lung]
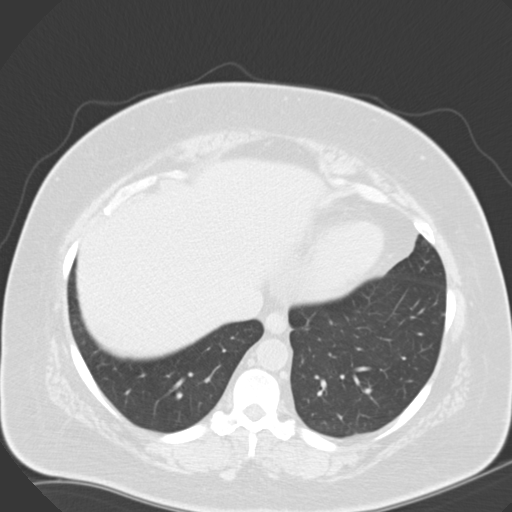
[im 144/158  lung]
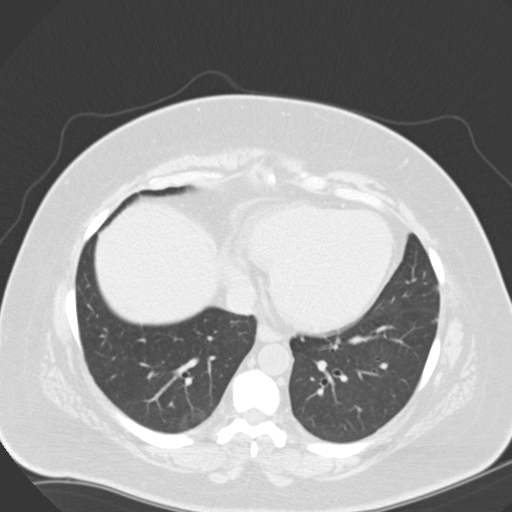
[im 151/158  soft-tissue]
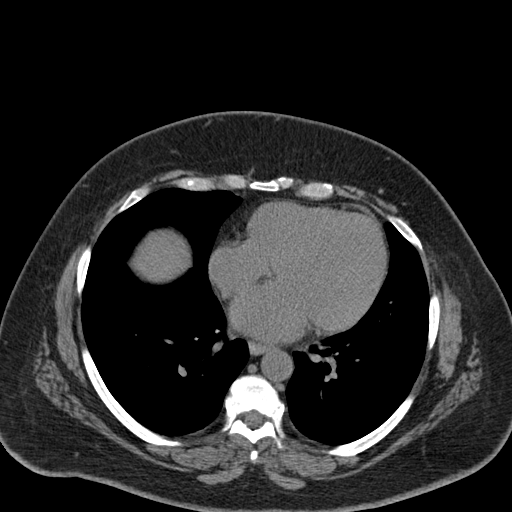
[im 151/158  lung]
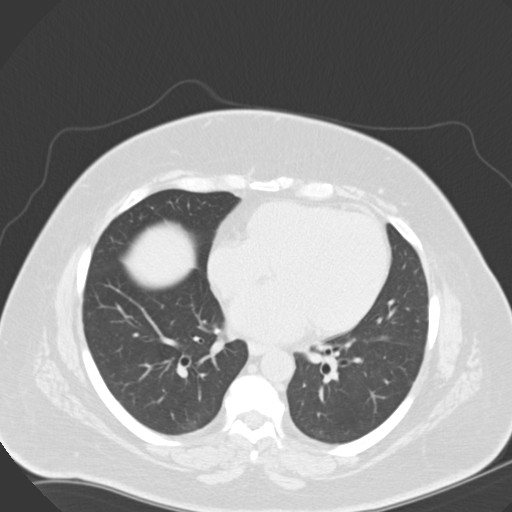

[15 of 32 positions shown; findings below may reference images not displayed]

PROCEDURE:     CT  - CT ABDOMEN AND PELVIS W[DATE] [DATE]

RESULT:     Noncontrast CT of the abdomen and pelvis is performed with oral
contrast only. Images are reconstructed at 3 mm slice thickness in the axial
plane and compared to the previous examination performed on 06 February, 2012.

There is a tiny low-attenuation area anteriorly in the left lobe of the
liver measuring up to approximately 10 mm which appears to be unchanged
compared previous exam. The liver is otherwise unremarkable. The spleen is
normal in size and attenuation. The adrenal glands, pancreas, gallbladder,
abdominal aorta and kidneys appear unremarkable. There is some perinephric
stranding without obstruction or nephrolithiasis. Stomach is nondistended.
The pancreas shows no mass or ductal dilation. The abdominal wall appears
intact. The urinary bladder is nondistended. The patient appears to be
status post hysterectomy. Soft tissue density in the right pelvic region is
unchanged and appears largest on images 15 and 16 with a length of
approximately 4.1 cm in width of 2.0 cm. No new masses are evident. Surgical
clips are seen in the left pelvic region. Bony structures appear
unremarkable. The lung bases are clear.
IMPRESSION: 1. Stable soft tissue density in the right adnexa anteriorly. As the patient
undergone cervical removal of both ovaries? No other significant abnormality.

[REDACTED]

## 2014-09-11 ENCOUNTER — Other Ambulatory Visit: Payer: Self-pay | Admitting: Internal Medicine

## 2014-09-11 DIAGNOSIS — Z139 Encounter for screening, unspecified: Secondary | ICD-10-CM

## 2014-09-18 NOTE — H&P (Signed)
PATIENT NAME:  Priscilla Villanueva, Priscilla Villanueva MR#:  [id number] DATE OF BIRTH:  November 22, 1950  DATE OF ADMISSION:  02/05/2013  PRIMARY CARE PHYSICIAN:  Dr. Tracie Harrier.   REFERRING PHYSICIAN:  Dr. Thomasene Lot.   CHIEF COMPLAINT:  Shortness of breath.   HISTORY OF PRESENT ILLNESS:  The patient is a 64 year old African American female with a past medical history of hypertension, diabetes mellitus, hyperlipidemia, is presenting to the ER with a chief complaint of a one day history of shortness of breath.  She is reporting that she was in her usual state of health until yesterday and today morning she started having shortness of breath which was acute in onset associated with chest tightness and swelling in her lower extremities.  Denies any dizziness or loss of consciousness.  No diaphoresis.  The patient comes to the ER.  She was hypoxic, but with 4 liters of oxygen she was sating at 95%.  Chest x-ray has revealed pulmonary edema.  The patient has received Lasix and as she was feeling better Dr. Thomasene Lot, the ER physician, tried to discharge her and wean her oxygen.  On room air the patient was hypoxic again, because of which he has to put her on 4 liters of oxygen to bring her sats up to 95%.  As the patient is still hypoxemic, hospitalist team is called to admit the patient with a diagnosis of congestive heart failure.  During my examination, the patient's chest tightness significantly improved and after diuresing her with 20 mg of IV Lasix the patient started feeling better.  Her boyfriend is at bedside.   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   PAST SURGICAL HISTORY:  Colon cancer status post excision and Hartmann procedure with colostomy and reversal, hysterectomy, Port-A-Cath, currently not on  any chemotherapy.    ALLERGIES:  AMLODIPINE WHICH CAUSES SWELLING.   PSYCHOSOCIAL HISTORY:  Lives alone, quit smoking 30 years ago.  She smoked only for two years.  No alcohol or other illicit drug usage.   FAMILY HISTORY:  Mother had history of colon cancer.  Brother had colon cancer and coronary artery disease.  Son has heart disease and deceased.   HOME MEDICATIONS:  Vitamin B12 500 mcg once daily, Protonix 40 mg once a day, pravastatin 20 mg once daily, Novolin 70/30 32 units subcutaneously twice a day, metoprolol succinate 100 mg once a day, metformin 1000 mg twice a day, magnesium oxide 400 mg twice a day, hydralazine 50 mg 1-1/2 tablets 3 times a day, furosemide 40 mg once daily, clonidine 0.2 mg 3 times a day, Benicar 40 mg once daily.    REVIEW OF SYSTEMS:  CONSTITUTIONAL:  Denies any fever, complaining of fatigue.  EYES:  Denies blurry vision, glaucoma.  EARS, NOSE, THROAT:  No epistaxis, discharge.  RESPIRATION:  Denies cough, but dyspneic.  No history of COPD.  CARDIOVASCULAR:  Chest tightness.  Denies any palpitations or syncope.  GASTROINTESTINAL:  Denies nausea, vomiting, diarrhea.  GENITOURINARY:  No dysuria or hematuria.  GYNECOLOGIC AND BREAST:  Denies breast mass or vaginal discharge.  Status post hysterectomy.  ENDOCRINE:  Denies polyuria, nocturia, thyroid problems.  HEMATOLOGIC AND LYMPHATIC:  No anemia, easy bruising.  INTEGUMENTARY:  No acne, rash, lesions.  MUSCULOSKELETAL:  No joint pain in the neck and back.  NEUROLOGIC:  No vertigo or ataxia.  PSYCHIATRIC:  No ADD, OCD.   PHYSICAL EXAMINATION: VITAL SIGNS:  Temperature 98.2, pulse 70, respirations 18, blood pressure 185/85, pulse ox 100% on 4 liters.  GENERAL APPEARANCE:  Not under acute distress.  Obese.  Moderately built. HEENT:  Normocephalic, atraumatic.  Pupils are equally reacting to light and accommodation.  No scleral icterus.  No conjunctival injection.  No sinus tenderness.  No postnasal drip.   NECK:  Supple.  No JVD.  No thyromegaly.  LUNGS:  Moderate air entry.  Positive rales and rhonchi at the bases.  CARDIAC:  S1 and S2 normal.  Regular rate and rhythm.  GASTROINTESTINAL:  Soft, obese.  Bowel sounds are positive in all four quadrants.  Nontender, nondistended.  No hepatosplenomegaly.  NEUROLOGIC:  Awake, alert, oriented x 3.  Motor and sensory grossly intact.  Reflexes are 2+.  EXTREMITIES:  1+ pitting edema is present.  No cyanosis.  No clubbing.  SKIN:  Warm to touch.  Normal liver turgor.  No rashes.  No lesions.  PSYCHIATRIC:  Normal mood and affect.   LABORATORY AND IMAGING STUDIES:  Glucose 157, BUN 15, creatinine 0.95, sodium 140, potassium 3.2, chloride 106, CO2 26, GFR greater than 60, serum osmolality 283.  Anion gap 8, calcium 9.3.  LFTs, AST and ALT are elevated at 63 and 89.  The rest of the LFTs are normal.  Troponin less than 0.02.  WBC 6.1, hemoglobin 12.5, hematocrit 38.4, platelets 176.  Chest x-ray, positive pulmonary edema.  A 12-lead EKG, normal sinus rhythm, normal EKG, normal PR and QRS interval.  No ST-T wave changes.   ASSESSMENT AND PLAN:  A 64 year old African American female presenting to the ER with a chief complaint of shortness of breath will be admitted with the following assessment and plan.  1.  Acute congestive heart failure with hypoxia.  It looks like this is new onset.  Admit her to telemetry.  We will provide her Lasix 40 mg IV q. 12 hours.  We will resume her aspirin, beta-blocker and statin.  We will obtain echocardiogram, cycle cardiac biomarkers.  We will check daily weights and strict ins and outs will be monitored.   2.  Chest tightness, rule out acute myocardial infarction.  Cycle cardiac biomarkers.  Cardiology consult is placed.  3.  Malignant hypertension.  Resume home medications and provide IV Lopressor as needed basis.  4.  Hyperlipidemia.  Check fasting lipid panel and continue statin.  5.  Insulin-dependent diabetes mellitus.  We  will resume home medication and the patient will be on insulin sliding scale for hyperglycemia.  6.  We will provide her GI and DVT prophylaxis.  7.  CODE STATUS:  She is a FULL CODE.  Boyfriend is her medical power of attorney.   Total time spent on admission is 50 minutes.    Diagnosis and plan of care was discussed in detail with the patient.  She is aware of the plan.  The patient will be transferred to Dr. Ginette Pitman in a.m.     ____________________________ Nicholes Mango, MD ag:ea D: 02/05/2013 02:03:41 ET T: 02/05/2013 06:01:33 ET JOB#: [id number]  cc: Nicholes Mango, MD, <Dictator> Tracie Harrier, MD Nicholes Mango MD ELECTRONICALLY SIGNED 02/15/2013 6:58

## 2014-09-18 NOTE — Discharge Summary (Signed)
PATIENT NAME:  Priscilla Villanueva, Priscilla Villanueva MR#:  [id number] DATE OF BIRTH:  1951-01-06  DATE OF ADMISSION:  02/05/2013 DATE OF DISCHARGE:  02/07/2013  DIAGNOSES AT TIME OF DISCHARGE: 1.  Acute on chronic congestive heart failure with hypoxemia.  2.  Malignant hypertension.  3.  Hyperlipidemia.  4.  Insulin-requiring diabetes.  5.  Obesity.   CHIEF COMPLAINT: Shortness of breath.   HISTORY OF PRESENT ILLNESS: Priscilla Villanueva is a 64 year old female with a history of hypertension, diabetes, hyperlipidemia, presented to the ER complaining of shortness of breath. The patient states that she also had some chest tightness associated swelling of lower extremities. She was noted to be hypoxemic but this has improved with 4 liters nasal cannula and O2 sats  were 95% on O2. Chest x-ray revealed evidence of pulmonary edema. She received intravenous Lasix and improved significantly.   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   PHYSICAL EXAMINATION: VITAL SIGNS: Temperature 98.2, pulse 70, respirations 18, blood pressure 185/85, O2 sat 100% on 4 liters nasal cannula.  GENERAL: She was not in distress. She was obese.  HEENT: [place]/AT.  NECK: Supple. No JVD.  LUNGS: Bilateral crackles in both bases with rhonchi.  HEART: S1, S2.  ABDOMEN: Soft, nontender.  EXTREMITIES: 1+ edema.  NEUROLOGIC: Nonfocal.   The patient was admitted to the CCU and received intravenous Lasix. During her stay in the hospital, she diuresed well. She was noted to be hypokalemic and potassium was replaced. She was also seen in consultation by Dr. Ubaldo Glassing, cardiologist, and underwent an echocardiogram. Echo showed LVEF of 60% to 65%, normal global LV systolic function, mild concentric LVH, mildly increased LV septal thickness,  mildly dilated left atrium, mildly dilated right atrium and  tricuspid regurgitation. The patient symptomatically improved, and she was ambulated, O2 was discontinued, her sats were well maintained at 96% on room air, and she was discharged home in stable condition on the following medications: Aspirin 81 mg a day, pravastatin 20 mg at bedtime, furosemide 40 mg b.i.d., Klor-Con 10 mEq b.i.d., Novolin 70/30, 32 units subQ  b.i.d., metoprolol succinate 150 mg a day, Mag-Ox 400 mg b.i.d., clonidine 0.2 mg t.i.d., ferrous fumarate 1 capsule once a day, Protonix 40 mg once a day, Benicar 40 mg once a day, metformin 1000 b.i.d. The patient is advised a low sodium diet and to follow up in the clinic with me, Dr. Ginette Pitman, in 1 to 2 weeks' time, and also advised followup with Dr. Ubaldo Glassing soon.  She has been advised to call back if there are any questions or concerns. The patient was stable at the time of discharge.   TOTAL TIME SPENT IN DISCHARGE OF THIS PATIENT: 35 minutes.   ____________________________ Tracie Harrier, MD vh:dmm D: 02/07/2013 13:08:11 ET T: 02/07/2013 22:39:50 ET JOB#: [id number]  cc: Tracie Harrier, MD, <Dictator> Tracie Harrier MD ELECTRONICALLY SIGNED 02/13/2013 18:24

## 2014-09-18 NOTE — Consult Note (Signed)
   Present Illness 64 yo female with history of hypertension, diabetes mellitus who was admitted with progressive shortness of breath and noted to have evidence of chf on cxr. SHe reports compliance with her medicaitons. She was admitted and agressively diuresed. She has ruled out for an mi. She has been treated with hydralazine, bendicar, metoprolol succinate and clonidine. Echo reveals preserved lv funciton with ef greater than 60% with lvh. Suggest acute on chornic diastollic dysfunction.   Physical Exam:  GEN obese   HEENT PERRL, hearing intact to voice   NECK supple  No masses   RESP normal resp effort  clear BS  no use of accessory muscles   CARD Regular rate and rhythm  No murmur   ABD normal BS   LYMPH negative neck   EXTR positive edema   SKIN normal to palpation   NEURO cranial nerves intact, motor/sensory function intact   PSYCH A+O to time, place, person   Review of Systems:  Subjective/Chief Complaint shortness of breath   General: Fatigue  Weakness   Skin: No Complaints   ENT: No Complaints   Eyes: No Complaints   Neck: No Complaints   Respiratory: Short of breath   Cardiovascular: Dyspnea  Edema   Gastrointestinal: No Complaints   Genitourinary: No Complaints   Vascular: No Complaints   Musculoskeletal: No Complaints   Neurologic: No Complaints   Hematologic: No Complaints   Endocrine: No Complaints   Psychiatric: No Complaints   Review of Systems: All other systems were reviewed and found to be negative   Medications/Allergies Reviewed Medications/Allergies reviewed   EKG:  EKG NSR   Abnormal NSSTTW changes    Amlodipine: Swelling   Impression 64 yo female with history of hypertension, diabetes  and sleep apnea who was admitted with progressive shortness of breath. She does not do daily weights so is not clear if she gained weight. She had gradual worseneing in her symtpoms over the past few days. Echo reveals preserved lv  function with evidence of diastollic chf. Appears to have acute on chronic chf. Would continue current outpatient medication regimen as it appears to be adequate with hydralazine, clonidine and metoprolol and benicar.   Plan 1. Continue diuesis and follow cxr and symptoms. 2Continue benicar, hydralzine, clonidine and metoprolol 3. Transfer to telemetry and ambulate. 4. Further recs pending course but would ocnsider discharge in am if stable with close outpatient follow up   Electronic Signatures: Teodoro Spray (MD)  (Signed 10-Sep-14 21:00)  Authored: General Aspect/Present Illness, History and Physical Exam, Review of System, EKG , Allergies, Impression/Plan   Last Updated: 10-Sep-14 21:00 by Teodoro Spray (MD)

## 2014-09-19 NOTE — H&P (Signed)
PATIENT NAME:  Priscilla Villanueva, Priscilla Villanueva MR#:  [id number] DATE OF BIRTH:  1951-04-11  DATE OF ADMISSION:  05/26/2013  PRIMARY CARE PHYSICIAN: Tracie Harrier, MD  REFERRING EMERGENCY ROOM PHYSICIAN: Dr. Karma Greaser  CHIEF COMPLAINT: Elevated blood pressure, chest pain, shortness of breath, and weight gain.   HISTORY OF PRESENT ILLNESS: The patient is a 64 year old African American female with past medical history of hypertension, congestive heart failure, diabetes mellitus, and multiple other medical problems who is presenting to the ER with the chief complaint of elevated blood pressure for the past 3 or 4 days. This is associated with chest pain, shortness of breath, and headache. Denies any blurry vision. Denies any dizziness or loss of consciousness. The patient reports that she stopped taking clonidine 0.2 mg 3 times a day as she ran out of it. She tried to get an appointment with primary care physician, but she was unable to because of the holiday season. As the blood pressure is getting worse, she comes into the ER. In the ER, the patient's blood pressure was very high at 208/104 and subsequently it went up to 219/98. CAT scan of the head was done, which was negative. The patient was given clonidine 0.2 mg once and labetalol 20 mg IV push. As there is no significant improvement, the patient eventually was started on nicardipine drip. During my examination, her blood pressure was at 152/78. The patient reports that she gained weight, face became puffy, and the weight gain was approximately 4 pounds in the past 3 to 4 days. She has some chest discomfort which was improved druing my exam. Denies any other complaints.   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  PAST SURGICAL HISTORY: Colon cancer status post excision and Hartmann procedure with  colostomy and reversal, hysterectomy, Port-A-Cath, currently not on any chemotherapy.  ALLERGIES: AMLODIPINE, WHICH CAUSES SWELLING.   PSYCHOSOCIAL HISTORY: Lives alone. Quit smoking. No alcohol or illicit drug usage.   FAMILY HISTORY: Mother had history of colon cancer. Brother had colon cancer and coronary artery disease and heart disease and is deceased.  HOME MEDICATIONS: Sucralfate 1 gram p.o. 4 times a day, Protonix 40 mg 2 times a day, metoprolol succinate 100 mg/1.5 mg once daily, metformin 1000 mg 2 times a day, magnesium oxide 400 mg 2 times a day, Klor-Con 10 mEq p.o. 2 times a day, hydralazine 50 mg 1-1/2 tablets 3 times a day, furosemide 40 mg 2 times a day, clonidine 0.2 mg 3 times a day, colchicine 0.6 mg as needed, allopurinol 100 mg once daily.   PSYCHOSOCIAL HISTORY: Lives alone. Quit smoking 30 years ago. Smoked for 2 years. No alcohol or illicit drug usage.  REVIEW OF SYSTEMS:  CONSTITUTIONAL: Denies any fever. Complaining of weakness.  EYES: Denies blurry vision, double vision.  ENT: Denies epistaxis, discharge. RESPIRATORY: Denies cough, COPD. CARDIOVASCULAR: Complaining of chest discomfort and shortness of breath.  GASTROINTESTINAL: Denies nausea, vomiting, diarrhea, or abdominal pain. No hematemesis.  GENITOURINARY: No dysuria, hematuria. GYNECOLOGIC AND BREASTS: Denies breast mass or vaginal discharge.  ENDOCRINE: Denies polyuria, nocturia. Has diabetes mellitus, insulin-dependent. HEMATOLOGIC AND LYMPHATIC: No anemia, easy bruising, or bleeding.  INTEGUMENTARY: No acne, rash, lesions.  MUSCULOSKELETAL: No joint pain in the neck and back. Denies any shoulder pain.  NEUROLOGIC: Denies vertigo, ataxia.  PSYCHIATRIC: No ADD, OCD.   PHYSICAL EXAMINATION: VITAL SIGNS: Temperature 98.4, pulse 90, respirations 20, blood pressure 152/78,  and pulse ox 97%.  GENERAL APPEARANCE: No in acute distress. The patient looks puffy with facial edema.  HEENT: Normocephalic,  atraumatic. Pupils are equally reacting to light and accommodation. No scleral icterus. No conjunctival injection. No sinus tenderness. Positive facial edema.  NECK: Supple. No JVD. No thyromegaly. Range of motion is intact.  LUNGS: Minimal rales are present. Moderate air entry.  CARDIAC: S1, S2 normal. Regular rate and rhythm. No murmur.  GASTROINTESTINAL: Soft, obese. Bowel sounds are positive in all 4 quadrants. Nontender, nondistended. No hepatosplenomegaly. No masses felt.  NEUROLOGIC: Awake, alert, and oriented x3. Motor and sensory grossly intact. Reflexes are 2+.  EXTREMITIES: Trace edema is present. No cyanosis. No clubbing.  PSYCHIATRIC: Normal mood and affect.  MUSCULOSKELETAL: No joint effusion, tenderness, or erythema.  LABORATORY AND DIAGNOSTICS: Chest x-ray:  No acute cardiopulmonary disease.   CT of the head without contrast: No acute intracranial abnormalities.   Urinalysis: Protein greater than 500, nitrite negative, leukocyte esterase negative. Glucose 150, WBC 7.2, hemoglobin 14.2, hematocrit 43.4, platelets 196. Troponin less than 0.02. LFTs: AST and ALT are elevated at 68 and 101. Total protein 8.4. Glucose 114, BUN 15, creatinine 1.04, sodium 138, potassium 3.2, chloride 103, CO2 29. GFR greater than 60. Anion gap 6. Serum osmolality 279, calcium 9.5. Magnesium 1.4. Lipase 237.   ASSESSMENT AND PLAN: A 64 year old African American female presenting to the ER with the chief complaint of elevated blood pressure associated with chest discomfort, headache, and shortness of breath and will be admitted with following assessment and plan.  1.  Malignant hypertension associated with headache and chest pain. Will admit the patient to CCU. Continue nicardipine drip. Resume home medications including metoprolol, hydralazine, and Lasix. Will hold off on the clonidine. Continue hydralazine. We will up titrate medications on as needed basis. We will hold on the clonidine as it could be  causing rebound hypertension if the patient is not taking clonidine regularly.  2.  Chest pain associated with shortness of breath. Probably from malignant hypertension. Will cycle cardiac biomarkers. Will provide her nebulizer treatments if needed.  3.  Hypokalemia. Repleted with potassium supplements in the ER.  4.  Hypomagnesemia. The patient has received 2 grams of magnesium sulfate IV in the ER.  5.  Mild congestive heart failure. The patient will be provided with Lasix IV. We will strictly monitor ins and outs. The patient will be on aspirin and statin.  6.  Diabetes mellitus and hyperlipidemia. Resume home medications.  7.  We will provide gastrointestinal and deep vein thrombosis prophylaxis with Protonix and subcutaneous Lovenox.  She is FULL code. Husband is the medical power of attorney. Diagnosis and plan of care was discussed in detail with the patient. She is aware of the plan.   The patient will be transferred to Dr. Linton Ham service.  TOTAL CRITICAL CARE TIME SPENT:  50 minutes.   ____________________________ Nicholes Mango, MD ag:sb D: 05/26/2013 07:23:45 ET T: 05/26/2013 08:51:38 ET JOB#: [id number]  cc: Nicholes Mango, MD, <Dictator> Nicholes Mango MD ELECTRONICALLY SIGNED 06/06/2013 7:08

## 2014-09-19 NOTE — Discharge Summary (Signed)
PATIENT NAME:  Priscilla, Villanueva MR#:  [id number] DATE OF BIRTH:  19-Aug-1950  DATE OF ADMISSION:  05/26/2013 DATE OF DISCHARGE:  05/28/2013   DIAGNOSES AT TIME OF DISCHARGE:  1. Malignant hypertension.  2. Diastolic congestive heart failure.  3. Chest pain, myocardial infarction ruled out.  4. Hypomagnesemia.  5. Type 2 diabetes.  6. Hyperlipidemia.   CHIEF COMPLAINT: Elevated blood pressure, chest pain, shortness of breath and weight gain.   HISTORY OF PRESENT ILLNESS: Priscilla Villanueva is a 64 year old African-American female with a past medical history significant for hypertension, congestive heart failure, diabetes mellitus, obesity, obstructive sleep apnea syndrome, who presented to the Emergency Room with elevated blood pressure. The patient states that she had run out of her clonidine and had not been taking it for the last few days. In the ER, blood pressure was noted to be 219/98. A CT of the head was negative. She was given clonidine 0.2 mg one-time dose, labetalol 20 mg IV push and subsequently started on nicardipine drip, and her blood pressure came down to 152/78.   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   PHYSICAL EXAMINATION:  VITAL SIGNS: Temperature 98.4, pulse was 90, respirations 20, blood pressure 152/78, pulse oximetry 97% on room air.  GENERAL: She was not in distress. She was somewhat edematous with facial puffiness.  HEART: S1, S2. No JVD.  LUNGS: Clear to auscultation.  ABDOMEN: Soft, nontender.  EXTREMITIES: Trace edema.  NEUROLOGIC: Nonfocal.   LABORATORY DATA: WBC 7.2, hemoglobin 14.2, hematocrit 43.4, platelets 196. Troponin less than 0.02. AST was elevated at 60, ALT 101, total protein 8.4. Glucose 114, BUN 15, creatinine 1.04, sodium 138, potassium 3.2, chloride 103, CO2 29, EGFR  greater than 60, anion gap of 6, magnesium 1.4, lipase 237. Urinalysis showed protein greater than 500, nitrites negative, leukocyte esterase was negative.   HOSPITAL COURSE: The patient was admitted initially to the CCU on nicardipine drip, and this was gradually weaned off. She also was restarted back on her clonidine as at home, and blood pressure gradually started to trend down. She did diurese well with IV Lasix and subsequently switched to p.o. Lasix, and her serum creatinine did go up to 1.4, that was felt as a result of diuretics. She was, however, stable, and blood pressure was down to 126/84 at the time of discharge. She was ambulated and maintained good O2 saturations at 95% on room air, and she was discharged in stable condition on the follow medications.  DISCHARGE MEDICATIONS:  1. Metformin 1000 mg b.i.d. 2. Benicar 40 mg once a day.  3. Ferrous fumarate 1 capsule once a day.  4. Vitamin B complex 100 mg once a day.  5. Clonidine 0.2 mg t.i.d. 6. Mag-Ox 400 mg p.o. b.i.d. 7. Novolin 70/30 at 30 units subcutaneous b.i.d.  8. Klor-Con 10 mEq b.i.d. 9. Furosemide 80 mg once a day.  10. Pravastatin 20 mg once a day.  11. Hydralazine 75 mg t.i.d. 12. Metoprolol succinate 150 mg once a day.  13. Protonix 40 mg once a day.  14. Allopurinol 100 mg once a day. 15. Sucralfate 1 gram q.i.d. and at bedtime.  16. Vitamin B12 500 mcg once a day. 17. Aspirin 325 mg once a day.   DISCHARGE INSTRUCTIONS: The patient has been advised low sodium diet and also low carb diet and advised to be  compliant with her medications and call back if there are any questions. She has been advised to follow up with me, Dr. Ginette Pitman, in 1 to 2 weeks' time.   TOTAL TIME SPENT ON THE DISCHARGE OF THIS PATIENT: 35 minutes.   ____________________________ Tracie Harrier, MD vh:lb D: 05/28/2013 13:03:38 ET T: 05/28/2013 13:18:56 ET JOB#: [id number]  cc: Tracie Harrier, MD, <Dictator> Tracie Harrier  MD ELECTRONICALLY SIGNED 05/30/2013 18:00

## 2014-09-20 NOTE — H&P (Signed)
PATIENT NAME:  Priscilla Villanueva, SANANGELO MR#:  [id number] DATE OF BIRTH:  1951/04/25  DATE OF ADMISSION:  08/26/2011  REFERRING PHYSICIAN: Dr. Jasmine December    PRIMARY CARE PHYSICIAN: Dr. Tomasita Morrow   PRIMARY ONCOLOGIST: Dr. Inez Pilgrim    PRESENTING COMPLAINT: High blood pressure and epistaxis.   HISTORY OF PRESENT ILLNESS: Priscilla Villanueva is a pleasant 64 year old woman with history of hypertension with labile blood pressures, diabetes, colon cancer status post excision and Hartmann procedure, hyperlipidemia, and iron deficiency anemia who presents with reports of developing elevated blood pressure followed by profuse epistaxis. The patient took an additional dose of clonidine but did not improve her blood pressure. Prior to developing epistaxis she had right-sided neck pain and had palpitations. She was seen by ENT, Dr. Richardson Landry, and had cauterization of her right nare artery as well as packing. She received clonidine and labetalol in the ED. After packing procedure, the patient had drop in her blood pressure from initial arrival pressure of 219/104. Her pressure went down to as low as 78/50. The patient received 2 liters IV fluids and a dose of IV phenylephrine 5 mg x1 with still persistent hypotension. The patient is being admitted for monitoring.   PAST MEDICAL HISTORY:  1. Hypertension with labile blood pressure on multiple medications.  2. Diabetes.  3. Metastatic sigmoid colon cancer status post excision and Hartmann procedure.  4. Hyperlipidemia.  5. Iron deficiency anemia.  6. Neuropathy.  7. Admitted July 2012 for acute renal failure in the setting of diuresis.   PAST SURGICAL HISTORY:  1. Colon cancer status post excision and Hartmann procedure with colostomy and reversal.  2. Hysterectomy.  3. Port-A-Cath.   ALLERGIES: Amlodipine causes swelling.   MEDICATIONS:  1. Multivitamin with mineral 1 tablet daily.  2. Potassium chloride 10 mEq b.i.d.  3. Pravastatin 20 mg at bedtime.  4. Clonidine 0.2  mg which she has actually been taking t.i.d. due to her uncontrolled pressures.  5. Lisinopril 40 mg daily.  6. Toprol-XL 100 mg daily.  7. Metformin 1000 mg b.i.d.  8. Novolin 70/30 22 units b.i.d.  9. Indomethacin 50 mg t.i.d. as needed.  10. Magnesium oxide 400 mg b.i.d.  11. Lasix 40 mg daily.  12. Amlodipine 5 mg daily   FAMILY HISTORY: Mother had colon cancer. Brother had colon cancer and coronary artery disease and MI. Her son has heart disease.   SOCIAL HISTORY: The patient lives in [place] alone. She quit tobacco 30 years ago. No alcohol or drug use.   REVIEW OF SYSTEMS: CONSTITUTIONAL: No fevers, nausea, or vomiting. EYES: No glaucoma. ENT: As per history of present illness. RESPIRATORY: No cough or wheezing. CARDIOVASCULAR: No chest pain or orthopnea. She has chronic ankle edema, unchanged. No syncope. She had palpitations prior to her epistaxis. GI: No nausea, vomiting, diarrhea, abdominal pain, hematemesis, or melena. GU: No dysuria or hematuria. ENDOCRINE: No polyuria or polydipsia. HEME: No bruising. MUSCULOSKELETAL: She has right side face pain and temporal pain status post packing. NEUROLOGIC: No history of strokes, seizures. PSYCH: Denies any depression or suicidal ideation.   PHYSICAL EXAMINATION:   VITAL SIGNS: Pulse 107, respiratory rate 18, blood pressure initially 219/104, blood pressure dropped to 70's over 50's, repeat pressure 103/42.   GENERAL: Lying in bed in discomfort.   HEENT: Normocephalic, atraumatic. Pupils equal and symmetric. Nonicteric sclerae. Pale conjunctivae. Nares with right nare packed and residual bleed. Oropharynx with blood from epistaxis. Moist mucous membrane.   NECK: Soft and supple. No adenopathy.  CARDIOVASCULAR: Slightly tachy. No murmurs, rubs, or gallops.   LUNGS: Clear to auscultation bilaterally.   ABDOMEN: Soft. Positive bowel sounds. No mass appreciated.   EXTREMITIES: Trace pedal and ankle edema. Dorsal pedis pulses  intact.   MUSCULOSKELETAL: No joint effusion.   SKIN: No ulcers.   NEUROLOGIC: Symmetrical strength. No focal deficits.   PSYCH: She is alert and oriented. The patient is cooperative.   PERTINENT LABS AND STUDIES: WBC 4.9, hemoglobin 11, hematocrit 33.1, platelets 187, MCV 76, WBC 5.4, hemoglobin 12.6, hematocrit 37.5, platelets 192, MCV 76, glucose 224, BUN 19, creatinine 0.98, sodium 143, potassium 3.6, chloride 105, carbon dioxide 25, calcium 8.7, total bilirubin 0.2, alkaline phosphatase 69, ALT 73, AST 50. INR 0.8. EKG with sinus rate of 99. No ST elevation or depression. There is P wave inversion.   ASSESSMENT AND PLAN: Priscilla Villanueva is a 64 year old woman with history of hypertension and labile blood pressures, metastatic colon cancer, diabetes, hyperlipidemia, and iron deficiency anemia presenting with elevated blood pressures and large volume epistaxis.  1. Hypertension, accelerated, with resultant right nare epistaxis, seen by Dr. Richardson Landry in the ED. She is status post cauterization and packing. Her blood pressure continues to be low. Will continue with IV fluids. Continue to cycle her hemoglobin every six hours. Concerning for significant blood loss contributing to her persistent low pressures plus/minus the effects of medication and nasal packing. Will transfuse 1 unit for now and continue to follow closely. Will hold all her blood pressure medicines and Lasix as well. Start her on clindamycin and will need this upon discharge per recommendations. She can have her packing removed on Wednesday.  2. Diabetes. Hold metformin. Restart 70/30 insulin and also start sliding scale insulin.  3. Hyperlipidemia. Restart pravastatin.  4. Iron deficiency anemia. Management as above.  5. Prophylaxis. On TEDs, SCDs. Encourage p.o. intake.   TIME SPENT: Approximately 45 minutes spent on patient care.   ____________________________ Rita Ohara, MD ap:drc D: 08/26/2011 00:13:46  ET T: 08/26/2011 09:12:01 ET JOB#: [id number]  cc: Brien Few Alesandra Smart, MD, <Dictator> Myrle Sheng. Jimmye Norman, MD Rita Ohara MD ELECTRONICALLY SIGNED 08/30/2011 22:15

## 2014-09-20 NOTE — Consult Note (Signed)
PATIENT NAME:  Priscilla Villanueva, Priscilla Villanueva MR#:  [id number] DATE OF BIRTH:  [birth year]  DATE OF CONSULTATION:  08/25/2011  REFERRING PHYSICIAN:  Emergency Room  CONSULTING PHYSICIAN:  Sammuel Hines. Richardson Landry, MD  REASON FOR CONSULTATION: Epistaxis.   HISTORY OF PRESENT ILLNESS: 64 year old female presented to Emergency Room with significant hypertensive crisis with a systolic blood pressure on admission of well over 200 and acute epistaxis. She had never had any problems with nosebleeds but says she just blew her nose and it began bleeding profusely from both sides although the right side seemed to be more significant. The Emergency Room packed both nostrils but she was still having bleeding around the pack. Her blood pressure at that point was still with a systolic of 0000000. They had gotten the blood pressure down further and the bleeding has subsided somewhat.   PAST MEDICAL HISTORY:  1. Hypertension.  2. Diabetes. 3. History of colon cancer.  4. History of iron deficiency anemia.  5. Neuropathy.  PAST SURGICAL HISTORY:  1. Partial hysterectomy. 2. History of colon resection with colostomy.   MEDICATIONS:  1. Clonidine 0.2 mg p.o. b.i.d.  2. Norvasc 5 mg p.o. daily.  3. Magnesium oxide 400 mg p.o. b.i.d.  4. Multivitamin 1 tablet daily.  5. Potassium chloride ER 10 mEq p.o. b.i.d.  6. Pravastatin 20 mg p.o. at bedtime. 7. Insulin Novolin 70/30, 22 units subcutaneous b.i.d.  8. Metoprolol 100 mg p.o. daily. 9. Metformin 1000 mg p.o. b.i.d.  10. Lisinopril 40 mg p.o. daily.  11. Indomethacin 50 mg p.o. t.i.d.  12. Furosemide 40 mg p.o. daily.   ALLERGIES: None.   SOCIAL HISTORY: Patient is a nonsmoker. Denies alcohol use   REVIEW OF SYSTEMS: Patient has not had any nausea, vomiting, diarrhea, cough. No history of previous bleeding. She denies history of heart disease or any problems with chest pain. Has not had any purulent nasal discharge or chronic nasal congestion.   PHYSICAL EXAMINATION:   VITAL SIGNS: Pulse 85, blood pressure 158/90, respirations 20.   GENERAL: Obese female, no acute distress with no active bleeding.  HEAD AND FACE: Head is normocephalic, atraumatic. No facial skin lesions. Facial strength is normal and symmetric.   EARS: External ear canals, tympanic membranes are clear bilaterally. There is no middle ear effusion or infection.   NOSE: External nose unremarkable, but both nostrils were packed with blood soaked Merocel packs. There is no active bleeding around the pack.   ORAL CAVITY AND OROPHARYNX: Teeth, lips, and gums unremarkable. There is blood staining in the pharynx but no active bleeding down the back of the throat. No mucosal lesions are seen.   NECK: Neck is supple without adenopathy or masses. No thyromegaly. Salivary glands are soft and nontender without masses.   NEUROLOGIC EXAM: Cranial nerves II through XII are grossly intact.   LABORATORY, DIAGNOSTIC AND RADIOLOGICAL DATA: Hematocrit 37.5, platelet count 192.    PROCEDURE NOTE: I discussed the situation with the patient. She has had some bleeding around this pack although she is not bleeding currently. I recommended removal of the packs to try to identify the source of the bleeding, see if I might be able to cauterize it although I informed her that I would certainly end up repacking the nose. She agreed.   DIAGNOSIS: Epistaxis.   PROCEDURE: Complex cautery and packing for control of epistaxis.   SURGEON: Sammuel Hines. Richardson Landry, MD  ANESTHESIA: 4% lidocaine topical.   DESCRIPTION OF PROCEDURE: After discussing the procedure with the patient,  the right pack was removed. Nose was suctioned. I immediately saw an arterial bleed from about 3 cm back on the right nasal septum. Blood was suctioned away and this area was cauterized repeatedly with silver nitrate. I was finally able to get the nosebleed to slow. I placed a temporary gauze pack for a few minutes and then looked again. The bleeding was  stopped. I re-cauterized the area further and then placed 8 cm Pope Merocel pack and expanded it with Neo-Synephrine spray. The left pack was subsequently removed from the nose and that side inspected and there was no bleeding whatsoever. The pharynx was inspected and there was no bleeding down the back of the throat and all the bleeding was felt to be well controlled. Patient tolerated the procedure well.   ASSESSMENT: Patient with hypertensive crisis with acute epistaxis.   PLAN: I was able to cauterize and pack the nose with good control of the bleeding from the right side. There was felt to be no further need for packing on the left side. The patient will be admitted to the medicine service for control of her hypertension. I would leave the pack in until this coming Wednesday. It would be ideal if she goes off her indomethacin which could be affecting platelet function. She will need antibiotics until the packs are removed with antistaphylococcal coverage during her admission and after discharge unless she ends up staying in the hospital until Wednesday, she can basically follow up with me as an outpatient to have the pack removed next Wednesday. Please reconsult if there is any further bleeding around the pack.   ____________________________ Sammuel Hines. Richardson Landry, MD psb:cms D: 08/26/2011 09:02:00 ET T: 08/26/2011 12:48:32 ET  JOB#: [id number] Riley Nearing MD ELECTRONICALLY SIGNED 08/29/2011 9:37

## 2014-09-20 NOTE — Discharge Summary (Signed)
PATIENT NAME:  Priscilla Villanueva, Priscilla Villanueva MR#:  [id number] DATE OF BIRTH:  [birth year]  DATE OF ADMISSION:  08/25/2011 DATE OF DISCHARGE:  08/27/2011  For a detailed note, please see the History and Physical done by Dr. Inez Catalina on admission.  DIAGNOSES AT DISCHARGE:  1. Epistaxis status post nasal packing, now improved.  2. Malignant hypertension, much improved.  3. Anemia secondary to epistaxis, also stable and improved.  4. Diabetes.  5. Hyperlipidemia.  6. Hypertension.   DIET: The patient is being discharged on a low-sodium, American Diabetic Association 1800 calorie, low fat diet.   ACTIVITY: As tolerated.   FOLLOWUP:  1. Follow up with Dr. Clyde Canterbury from ENT on 04/03 for removal of the nasal packing.    2. Follow up with primary care physician in the next 1 to 2 weeks.    DISCHARGE MEDICATIONS:  1. Potassium 10 mEq b.i.d.  2. Multivitamin daily.  3. Pravachol 20 mg daily.  4. Toprol 100 mg daily.  5. Lasix 40 mg daily.  6. Indomethacin 50 mg t.i.d. as needed.  7. Lisinopril 40 mg daily.  8. Metformin 1000 mg b.i.d.  9. Novolin 70/30 20 units b.i.d. 10. Amlodipine 5 mg daily.  11. Magnesium chloride 400 mg b.i.d.  12. Clonidine 0.2 mg t.i.d.  13. Clindamycin 300 mg p.o. q. 8 hours times four days.   CONSULTANTS DURING HOSPITAL COURSE:  Dr. Clyde Canterbury from ear, nose, and throat.   PERTINENT STUDIES: Hemoglobin on admission at 12.6, to go down as low as 10.3.   HOSPITAL COURSE: This is a 64 year old female with medical problems as mentioned above who presented to the hospital on 03/29 secondary to epistaxis and also malignant hypertension.  1. Malignant hypertension: The patient presented to the hospital with severely elevated blood pressures complicated with underlying epistaxis. The patient was started on her home medications including the clonidine, Norvasc, metoprolol, and lisinopril along with some p.r.n. hydralazine. Over the past 24 to 48 hours the patient's blood  pressure has significantly improved, and since her blood pressure has improved so has her epistaxis. She currently is being discharged on the regimen as mentioned above with close followup with her primary care physician as an outpatient. She did not have any evidence of sequelae of malignant hypertension while she was in the hospital.  2. Epistaxis: This is likely a result of her significantly elevated blood pressures. She was seen by ENT in the Emergency Room. Her left naris was packed. She was empirically started on clindamycin. She has had no further episodes of epistaxis. She was transfused 1 unit of packed red blood cells. Hemoglobin has remained stable with no evidence of any further bleeding. Her nasal packing is to be removed by ENT coming up this Wednesday, and she will follow up with Dr. Richardson Landry as an outpatient. She will continue empiric clindamycin until she sees Dr. Richardson Landry as an outpatient.  3. Diabetes: The patient was maintained on her sliding scale insulin and insulin 70/30, but she will resume her metformin along with her 70/30 upon discharge.  4. Hyperlipidemia: The patient was maintained on her Pravachol and she will continue that.   CODE STATUS: The patient is a FULL CODE.   TIME SPENT ON DISCHARGE: 40 minutes.   ____________________________ Belia Heman. Verdell Carmine, MD vjs:bjt D:  08/27/2011 14:38:25 ET         T: 08/29/2011 11:11:00 ET        JOB#: [id number]  cc: Sammuel Hines. Richardson Landry, MD Myrle Sheng Jimmye Norman, MD  Henreitta Leber MD ELECTRONICALLY SIGNED 08/29/2011 14:59

## 2014-09-28 ENCOUNTER — Other Ambulatory Visit: Payer: Self-pay

## 2014-09-28 ENCOUNTER — Ambulatory Visit: Payer: Self-pay | Admitting: Internal Medicine

## 2014-09-28 ENCOUNTER — Other Ambulatory Visit
Admission: RE | Admit: 2014-09-28 | Discharge: 2014-09-28 | Disposition: A | Discharge status: Home / Self Care | Payer: Medicare Other | Source: Ambulatory Visit | Attending: Internal Medicine | Admitting: Internal Medicine

## 2014-09-28 DIAGNOSIS — C189 Malignant neoplasm of colon, unspecified: Secondary | ICD-10-CM | POA: Insufficient documentation

## 2014-09-30 LAB — OCCULT BLOOD X 1 CARD TO LAB, STOOL
FECAL OCCULT BLD: NEGATIVE
OCCULT BLOOD, FECES: NEGATIVE
Occult Blood, Feces: NEGATIVE

## 2014-10-01 ENCOUNTER — Inpatient Hospital Stay (HOSPITAL_BASED_OUTPATIENT_CLINIC_OR_DEPARTMENT_OTHER): Payer: Medicare Other | Admitting: Internal Medicine

## 2014-10-01 ENCOUNTER — Encounter: Payer: Self-pay | Admitting: *Deleted

## 2014-10-01 ENCOUNTER — Inpatient Hospital Stay: Payer: Medicare Other | Attending: Internal Medicine | Admitting: Internal Medicine

## 2014-10-01 VITALS — BP 186/99 | HR 80 | Temp 97.5°F | Wt 250.2 lb

## 2014-10-01 DIAGNOSIS — Z79899 Other long term (current) drug therapy: Secondary | ICD-10-CM | POA: Insufficient documentation

## 2014-10-01 DIAGNOSIS — E1159 Type 2 diabetes mellitus with other circulatory complications: Secondary | ICD-10-CM | POA: Insufficient documentation

## 2014-10-01 DIAGNOSIS — I5032 Chronic diastolic (congestive) heart failure: Secondary | ICD-10-CM | POA: Insufficient documentation

## 2014-10-01 DIAGNOSIS — I2510 Atherosclerotic heart disease of native coronary artery without angina pectoris: Secondary | ICD-10-CM | POA: Insufficient documentation

## 2014-10-01 DIAGNOSIS — I509 Heart failure, unspecified: Secondary | ICD-10-CM | POA: Insufficient documentation

## 2014-10-01 DIAGNOSIS — E119 Type 2 diabetes mellitus without complications: Secondary | ICD-10-CM

## 2014-10-01 DIAGNOSIS — Z9221 Personal history of antineoplastic chemotherapy: Secondary | ICD-10-CM | POA: Diagnosis not present

## 2014-10-01 DIAGNOSIS — Z794 Long term (current) use of insulin: Secondary | ICD-10-CM | POA: Insufficient documentation

## 2014-10-01 DIAGNOSIS — I10 Essential (primary) hypertension: Secondary | ICD-10-CM | POA: Insufficient documentation

## 2014-10-01 DIAGNOSIS — C786 Secondary malignant neoplasm of retroperitoneum and peritoneum: Secondary | ICD-10-CM | POA: Insufficient documentation

## 2014-10-01 DIAGNOSIS — C187 Malignant neoplasm of sigmoid colon: Secondary | ICD-10-CM

## 2014-10-01 DIAGNOSIS — Z800 Family history of malignant neoplasm of digestive organs: Secondary | ICD-10-CM

## 2014-10-01 DIAGNOSIS — C189 Malignant neoplasm of colon, unspecified: Secondary | ICD-10-CM

## 2014-10-01 DIAGNOSIS — E1129 Type 2 diabetes mellitus with other diabetic kidney complication: Secondary | ICD-10-CM | POA: Insufficient documentation

## 2014-10-01 LAB — HEMOGLOBIN: Hemoglobin: 11.4 g/dL — ABNORMAL LOW (ref 12.0–16.0)

## 2014-10-01 MED ORDER — HEPARIN SOD (PORK) LOCK FLUSH 100 UNIT/ML IV SOLN
INTRAVENOUS | Status: AC
  Filled 2014-10-01: qty 5

## 2014-10-01 MED ORDER — HEPARIN SOD (PORK) LOCK FLUSH 100 UNIT/ML IV SOLN
500.0000 [IU] | Freq: Once | INTRAVENOUS | Status: DC
Start: 2014-10-01 — End: 2014-10-01
  Filled 2014-10-01: qty 5

## 2014-10-01 MED ORDER — SODIUM CHLORIDE 0.9 % IJ SOLN
10.0000 mL | INTRAMUSCULAR | Status: DC | PRN
  Administered 2014-10-01: 10 mL via INTRAVENOUS
  Filled 2014-10-01: qty 10

## 2014-10-01 MED ORDER — SODIUM CHLORIDE 0.9 % IJ SOLN
10.0000 mL | INTRAMUSCULAR | Status: DC | PRN
  Filled 2014-10-01: qty 10

## 2014-10-01 MED ORDER — HEPARIN SOD (PORK) LOCK FLUSH 100 UNIT/ML IV SOLN
500.0000 [IU] | Freq: Once | INTRAVENOUS | Status: AC
  Administered 2014-10-01: 500 [IU] via INTRAVENOUS

## 2014-10-02 ENCOUNTER — Ambulatory Visit: Payer: Self-pay | Admitting: Internal Medicine

## 2014-10-02 ENCOUNTER — Other Ambulatory Visit: Payer: Self-pay

## 2014-10-02 LAB — CEA: CEA: 6.7 ng/mL — ABNORMAL HIGH (ref 0.0–4.7)

## 2014-10-02 NOTE — Progress Notes (Signed)
Follow Up Note  this is 10/01/14 note  Reason for Visit: This 64 year old Female patient presents to the clinic for f/o colon cancer.   Referred by DR [facility]  DR [place]  DR Ginette Pitman , DR PARACHOS, DR BENNETT.  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 wild type K-ras,  neg braf mutation. Chemotx given, completed 05/2008  2.Family history colorectal cancer and young age her mid 51s appropriate for genetic counseling, advised   3.Absence of measurable or evaluable disease a CT was negative for metastatic disease CEA was initially low, 0.5 by bayer assay, there are tiny lesions in liver determined on review to be cysts, f/u CT after 8 cycles, fatty liver, borderline enlarged subcarinal nodes, abnormal area scar vs metastatic/persistent disease,   CEA elevated since 07/2008, some waxing and waning, was 5.9 07/2008, 6.1 08/2009,  7.2 02/2010,  7.8 IN 4/12, 10.6 6/13,  6.4 12/08/11 . CT stable abdomen and pelvis 02/2009, then  scan 08/2010 and 08/2011. No recurrence.  Residual ovarian tissue. Reviewed with Dr Burt Knack in radiology, no change 08/2010 to 08/2011, best  on CT with oral contrast  STABLE 01/2012, and 07/2012, HAD ADVISED TO CHECK STABILITY FOR 2 YRS NOW DONE   4.  colonoscopy normal 05/2009 and  10/2010.,and 07/2012, ALSO EGD 07/2012  5.Status post anemia due to iron deficiency blood loss treated with transfusion during hospitalization /09 s/p reversal of colostomy 08/05/08,,,,Note epistaxis 3/13 with drop hgb to 10.3 from 12.6, then residual low iron sat 17%  09/29/11, 12/08/11 iron sat 11% and ferritin slightly lower. stools guiac x 3 negative 7/13 and 10/13, and 12/2013 . Also, patient has chronically low MCV, apparently baseline  hemoglobin is 12.2 MCV 76 in July 2012                                                                                                                                           5. Mild elevation LFT, wax and wane, stable since 08/2009 6. labile blood pressure followed by primary care  Current Treatment Plan:  Treatment Plan folfox plus vectibix cycle 1 day 1 12/13/07, cycle 2 was delayed in August 11 with one level reduction of FU and Vectibix reduced by 50%, cycle 3 August 25 with 5-FU reduced to another dose level, cycle 4 49f titrated up to 1920/m2, cycle 5 oxalaplatin 85/m2. fu bolus 260/m2, fu infusion 1640/m2, vectibix up to 4.5/kg, cycle 6 reduced due to stomatitis, see 9/29 and 03/03/08 note, to oxala 65/m2, fu bolus 230/m2, fu infuse 1400/m2, same dose cycle 7 , 8., 9, 10, cycle 11 drop fu bolus,  hold cycle 12, d/c tx  HISTORY OF PRESENT ILLNESS:  HPI   Patient. S/P 11 cycles tx as above, .S/P reversal of colostomy 08/04/08,..finding extensive adhesions, also ovary with cyst was removed on the left, could not bx the abnormal area seen on CT, and wound left to close by secondary intention,. Followed with issues as in problem list, with persistent but stable elevations of CEA.Marland Kitchen and LFT wax and wane was normal 03/2011 . as prior noted had CT renal artery done result normal, as prior noted last CT abdo and pelvis 07/2012, stable GI, also stable adnexal densities x 2 yrs , as in chief complaints above. .. had ECHO. HAS HAD PRIOR ELEVATION OF SGOT AND SGPT . TODAY NO ACUTE ISSUES, CURRENTLY NO ACUTE COMPLAINTS, ..since last visit 3 stools neg in April, has not had mammogram ,is due, has seen pmd did not have breast exam,   PREVIOUS TUMOR STAGING: Tumor Staging:   Tumor T1   Node N0   Metastasis M1   Stage IV    PFSH:  Family History positive, mother with some type of tumor in the vulva liver unknown primary brother died at a young age colon cancer   Comments no changes   Social History  negative alcohol, negative tobacco   Additional Past Medical and Surgical History hypertension Status post partial hysterectomy   ALLERGIES:   Amlodipine: Swelling  HOME MEDICATIONS: Medication Instructions Last Modified Date/Time  Klor-Con 10 mEq oral tablet, extended release 1 tab(s) orally 2 times a day 08-Feb-16 15:36  furosemide 40 mg oral tablet 1  tab orally 2 times a day 08-Feb-16 15:36  pravastatin 20 mg oral tablet 1 tab(s) orally once a day (at bedtime) 08-Feb-16 15:36  metformin 1000 mg oral tablet 1 tab(s) orally 2 times a day 08-Feb-16 15:36  Vitamin B Complex 100 1 tab(s) orally once a day 08-Feb-16 15:36  magnesium oxide 250 mg oral tablet 2 tab(s) orally once a day 08-Feb-16 15:36  Benicar 40 mg oral tablet 1 tab(s) orally once a day 08-Feb-16 15:36  metoprolol 200 mg oral tablet, extended release 1 tab(s) orally once a day 08-Feb-16 15:36  aspirin 81 mg oral tablet 1 tab(s) orally once a day 08-Feb-16 15:36  HumaLOG 100 units/mL subcutaneous solution 32 unit(s) subcutaneous 2 times a day 08-Feb-16 15:36  cloNIDine 0.2 mg oral tablet 1 tab(s) orally 3 times a day 08-Feb-16 15:36  hydrALAZINE 50 mg oral tablet 1.5 tab(s) orally 3 times a day 08-Feb-16 15:36  allopurinol 100 mg oral tablet 1 tab(s) orally once a day 08-Feb-16 15:36   Additional Medications:  1. flu shot 09   2. declined FLU SHOT subsequetly   4. i   ROS:  Review of Systems    no chills no sweats  no edema no bone pain ,, ,   NOTE PRIOR HX PALPITATIONS, CARDIOLOGY EVAL, NORMAL ECHO. NOTE PRIOR ER EVALUATION FOR DEPRESSION, no other new issues,   Nursing Vital Signs and Chemo Flowsheets: Chemo FlowSheet:   08-Feb-16 14:51  Vascular Access Device Port-a-Cath  Length of Huber 1  IV Device Insertion Attempts Number of Insertion Attempts 1  Vascular Access Site Right; Chest  Vascular Access Assessment Good blood return  Vascular Access Deaccessed Yes; Site without signs and symptoms    15:30    Influenza Risk Assessment for Vaccine  Patient Refused; Vaccine INDICATED.  9 years or older; LAST INFLUENZA IN 2009-PT REFUSES FURTHER VACCINATION  *CC Vital Signs Flowsheet:   08-Feb-16 15:30  Temp Temperature 97.7  Pulse Pulse 91  Respirations Respirations 16  SBP SBP 176  DBP DBP 64  Pain Scale (0-10)  0  Pulse Oxi  100  Current Weight (kg) (kg) 112.5  Height (cm) centimeters 165.1  BSA (m2) 2.1   Physical Exam:  Physical Exam Alert and cooperative no acute distress,   Ent no thrush, , sclera clear,  Lymph nodes negative neck, supraclavicular submandibular, axilla,  Lungs clear no wheezing or rales, rhonchi  Heart regular  Abdomen nontender ,  no palpable mass or organomegaly Neuro grossly nonfocal Cranial nerves intact                                                                                                               Psych alert, cooperative, , not anxious,  extremities, some trace soft tissue thickness ankles pretibial symmetric no edema   Laboratory Results: Oncology:  08-Feb-16 14:48   Cancer Antigen (CA) 125 5.2 [facility] methodology            [facility]            No: [id number]           [address], [place], [place] [address]           Lindon Romp, MD         [phone] Result(s) reported on 07 Jul 2014 at 01:49PM.)  Carcinoembryonic Antigen (CEA)  6.3 [facility] methodology       Nonsmokers  <3.9                                                     Smokers     <5.6            [facility]            No: [id number]           [address] [place], [place] [address]           Lindon Romp, MD         [id number] Result(s) reported on 07 Jul 2014 at 01:49PM.)  Carcinoembryonic Antigen ========== TEST NAME ==========  ========= RESULTS =========  = REFERENCE RANGE =  CARCINOEMBRYONIC AB    RELEVANT RESULTS:  Relevant Scans and Labs CT ABDO/PELVIS 5/09 AND 7/09 , POSSIBLE LESIONS/CYST LIVER                                                                           CT CHEST, ABDO, PELVIS 11/09, BORDERLINE ENLARGED NODES SUBCARINAL, POSSIBLE CHANGE IN LESIONS LIVER,  CT CHEST, ABDO, PELVIS, 06/17/08  ABNORMAL THICKENED IN SOFT TISSUE NEAR SIGMOID COLON IS STABLE, POST OP VS TUMOR, REVIEW OF LIVER COMPARED TO ALL PRIOR SCANS SHOWS STABLE SMALL CYSTS         CT 11/2008 AND 02/2009 RESOLVED PRIOR AREAS OF SCAR/ THICKENING,  CT 2O12 AND 2013   ASSESSMENT AND PLAN:  Impression   T4 N0 M1 by virtue of implants along the small bowel . appear to be direct extension, as per personal communication with Dr. Ronalee Belts there was also unresectable disease in retroperitoneum. K-Ras wild type, BRAF no mutation Hypertension,   Family history suggested  screen for  lynch syndrome, met with genetic councellor Chrystal Fogelman, explored possible funding for testing.  Prior Ida                                                                                                                                                                                     SEE 05/25/08 EVAL, re dose reduced then d/c for toxicity,      08/24/08,  s/p surgery could not bx area in question on prior CT. CT AS PREVIOUSLY NOTED REVIEWED IN TUMOR CONFERENCE, AREA OF THICKENING WORRISOME FOR PERSISTANT TUMOR,      CEA  08/05/08 5.9 ROACHE/2.4 BAYER , THEN 4.5 IN APRI/10L , VS OLD BASELINE 0.5 BAYER TECHNIQUE .  CXR ELEVATED HEMIDIAPHRAGM , SINCE LAST SURGERY.   CEA 6.3 to 7.8 and one reading over 10 since 2011. CT 02/26/09 ABDO AND PELVIS NEGATIVE, AND STABLE FROM JULY     PERSISTENT, WAX AND WANE INCREASED  LFT, SEE COMPARISON TO 08/2009,  .. NEUROPATHY NEARLY COMPLETELY RESOLVED,  08/2010 ABNORMAL CT WITH NARROW SEGMENT VS PERISTALSIS, COLONOSCOPY OK JUNE/12, .Marland Kitchen ACTIVE ISSUES TO FOLLOW, LOW IRON,  GI W/U DONE 07/2012, ,OVARIAN TISSUE ON CT, STABLE 07/2012, COMPLETES 2 YRS OF PLANNED F/U FROM 08/2010 . CEA UP/ DOWN,  5.9 in 2010, has been  6.4    BP WAX  AND WANE, CHRONIC HIGH,  FOLLOWED BY PMD,   LFT WAX AND WANE SINCE 2011,  9/14 ABNORMAL CXR REPORT POSSIBLE RIGHT HILAR ADENOPATHY, DISCUSSED WITH RADIOLOGY, LOOKS LIKE VASCULAR PROMINENCE, REPEAT FILM  05/12/13 ALSO DISCUUSSED WITH RADIOLOGY , READING BENIGN. . TODAY NO NEW COMPLAINTS, EXAM STABLE, CEA STABLE 6.3,  CA 125 NORML. AS PRIOR NOTED, LAST IRON STUDIES  LOW, MCV CHRONICALLY LOW, DATES BACK TO WHEN GI W/U DONE, AND NEGATIVE, AND SERIAL STOOL NEG.    Plan   1.  CONTINUE q 6 weeks  PORT FLUSH, , 2.. . REPEAT CA 125 AND CEA  Q 12 WEEKS, ,, 3  CLINICAL EXAM Q 3 MO  4.  CHECK STOOL GUIACS SERIALLY .5. CHECK TODYS CEA .  6. PATIENT ADVISD BUT DECLINED CLINICAL BREAST EXAM TODAY, STATES DOES SELF EXAM, AND MAMMOGRAM DUE IN MAY IS SCHEDULED ...7.Marland Kitchen RECHECK WITH GI IF COLONOSCOPY INDICATED 32016. Marland KitchenSEEE 8 WKS, MAY WANT PORT OUT...addendum. Mammogram done 5/12 normal  Physicians To Recieve Fax: Tracie Harrier, MD - [phone].  Electronic Signatures: Dallas Schimke (MD)  (Signed 14-Feb-16 14:19)  Authored: FOLLOW UP, DIAGNOSIS, CURRENT TREATMENT PLAN, HISTORY OF PRESENT ILLNESS, PREVIOUS TUMOR STAGING, PFSH, ALLERGIES, OUTPATIENT MEDS, MEDICATIONS Additional Comments, ROS, NURSING NOTES, PE, LABORATORY RESULTS, RELEVANT RESULTS, ASSESSMENT AND PLAN, Fax to Physician   Last Updated: 14-Feb-16 14:19 by Dallas Schimke (MD)

## 2014-10-08 ENCOUNTER — Ambulatory Visit
Admission: RE | Admit: 2014-10-08 | Discharge: 2014-10-08 | Disposition: A | Discharge status: Home / Self Care | Payer: Medicare Other | Source: Ambulatory Visit | Attending: Internal Medicine | Admitting: Internal Medicine

## 2014-10-08 DIAGNOSIS — Z1231 Encounter for screening mammogram for malignant neoplasm of breast: Secondary | ICD-10-CM | POA: Insufficient documentation

## 2014-10-08 DIAGNOSIS — Z139 Encounter for screening, unspecified: Secondary | ICD-10-CM

## 2014-10-20 ENCOUNTER — Telehealth: Payer: Self-pay | Admitting: *Deleted

## 2014-10-20 MED ORDER — LIDOCAINE-PRILOCAINE 2.5-2.5 % EX CREA: 1.0000 "application " | TOPICAL_CREAM | CUTANEOUS | Status: DC | PRN

## 2014-10-20 NOTE — Telephone Encounter (Signed)
Escript sent

## 2014-10-28 ENCOUNTER — Other Ambulatory Visit: Payer: Self-pay | Admitting: Internal Medicine

## 2014-11-23 ENCOUNTER — Other Ambulatory Visit: Payer: Self-pay

## 2014-11-26 ENCOUNTER — Inpatient Hospital Stay: Payer: Medicare Other | Attending: Family Medicine | Admitting: Family Medicine

## 2014-11-26 ENCOUNTER — Encounter: Payer: Self-pay | Admitting: Family Medicine

## 2014-11-26 ENCOUNTER — Inpatient Hospital Stay: Payer: Medicare Other

## 2014-11-26 VITALS — BP 203/107 | HR 85 | Temp 97.8°F | Resp 20 | Wt 242.3 lb

## 2014-11-26 DIAGNOSIS — Z85038 Personal history of other malignant neoplasm of large intestine: Secondary | ICD-10-CM | POA: Diagnosis not present

## 2014-11-26 DIAGNOSIS — C189 Malignant neoplasm of colon, unspecified: Secondary | ICD-10-CM

## 2014-11-26 LAB — HEMOGLOBIN: HEMOGLOBIN: 13 g/dL (ref 12.0–16.0)

## 2014-11-26 NOTE — Progress Notes (Signed)
[facility]  [phone]  Fax:([phone]) [place] DOB: 1951-03-20  MR#: [id number]  YYT#:[id number]  Patient Care Team: Tracie Harrier, MD as PCP - General (Internal Medicine)  CHIEF COMPLAINT:  Chief Complaint  Patient presents with  . Colon Cancer  . Follow-up    INTERVAL HISTORY:  Patient is here for continued follow-up regarding locally advanced colon cancer diagnosed in 2009. She reports feeling very well. She denies any acute complaints but has many questions about frequency of visits. She would like to decrease frequency if at all possible due to financial hardships. Her CEA has remained stable for several years now. She follows with primary care provider, Dr. Ginette Pitman. She is also requesting to have her Port-A-Cath removed, and she does not like to have to come have it flushed every 6 weeks, and insurance does not cover.  REVIEW OF SYSTEMS:   Review of Systems  Constitutional: Negative for fever, chills, weight loss, malaise/fatigue and diaphoresis.  HENT: Negative for congestion, ear discharge, ear pain, hearing loss, nosebleeds, sore throat and tinnitus.   Eyes: Negative for blurred vision, double vision, photophobia, pain, discharge and redness.  Respiratory: Negative for cough, hemoptysis, sputum production, shortness of breath, wheezing and stridor.   Cardiovascular: Negative for chest pain, palpitations, orthopnea, claudication, leg swelling and PND.  Gastrointestinal: Negative for heartburn, nausea, vomiting, abdominal pain, diarrhea, constipation, blood in stool and melena.  Genitourinary: Negative.   Musculoskeletal: Negative.   Skin: Negative.   Neurological: Negative for dizziness, tingling, focal weakness, seizures, weakness and headaches.  Endo/Heme/Allergies: Does not bruise/bleed easily.  Psychiatric/Behavioral: Negative for depression. The patient is not nervous/anxious and does not have insomnia.     As per HPI.  Otherwise, a complete review of systems is negatve.  ONCOLOGY HISTORY:  No history exists.    PAST MEDICAL HISTORY: Past Medical History  Diagnosis Date  . Hypertension   . CHF (congestive heart failure)   . Diabetes mellitus without complication   . Hypercholesteremia   . Colon cancer 2009    with chemotherapy    PAST SURGICAL HISTORY: Past Surgical History  Procedure Laterality Date  . Abdominal hysterectomy      PARTIAL  . Upper gi endoscopy    . Colectomy      FAMILY HISTORY Family History  Problem Relation Age of Onset  . Liver cancer Mother     "had some type of tumor in the vulva liver unknown primary"  . Colon cancer Brother     "died at young age"    GYNECOLOGIC HISTORY:  No LMP recorded. Patient has had a hysterectomy.     ADVANCED DIRECTIVES:    HEALTH MAINTENANCE: History  Substance Use Topics  . Smoking status: Never Smoker   . Smokeless tobacco: Never Used  . Alcohol Use: No     Colonoscopy:  PAP:  Bone density:  Lipid panel:  Allergies  Allergen Reactions  . Amlodipine Swelling    Causes pt's feet and legs to swell...    Current Outpatient Prescriptions  Medication Sig Dispense Refill  . allopurinol (ZYLOPRIM) 100 MG tablet Take 200 mg by mouth daily.     Marland Kitchen ALPRAZolam (XANAX) 0.25 MG tablet Take 0.25 mg by mouth at bedtime as needed for anxiety.    Marland Kitchen aspirin 81 MG EC tablet Take 81 mg by mouth daily. Swallow whole.    . b complex vitamins capsule Take 1 capsule by mouth daily.    . cloNIDine (  CATAPRES) 0.3 MG tablet     . dapagliflozin propanediol (FARXIGA) 5 MG TABS tablet Take 5 mg by mouth daily.    . ferrous sulfate 325 (65 FE) MG EC tablet Take 325 mg by mouth every morning.    . furosemide (LASIX) 40 MG tablet Take 40 mg by mouth 2 (two) times daily.    Marland Kitchen HUMALOG MIX 75/25 KWIKPEN (75-25) 100 UNIT/ML Kwikpen     . hydrALAZINE (APRESOLINE) 50 MG tablet Take 100 mg by mouth 3 (three) times daily.     . insulin lispro (HUMALOG)  100 UNIT/ML injection Inject 32 Units into the skin 2 (two) times daily before a meal.    . lidocaine-prilocaine (EMLA) cream Apply 1 application topically as needed. Apply at least 1 hour prior to port being accessed 30 g 1  . Magnesium 250 MG TABS Take 2 tablets by mouth 2 (two) times daily.    . metFORMIN (GLUCOPHAGE) 1000 MG tablet Take 1,000 mg by mouth 2 (two) times daily with a meal.    . metoprolol (TOPROL-XL) 200 MG 24 hr tablet Take 200 mg by mouth daily.    Marland Kitchen olmesartan (BENICAR) 40 MG tablet Take 40 mg by mouth daily.    . potassium chloride (K-DUR) 10 MEQ tablet TAKE ONE TABLET BY MOUTH TWICE DAILY 60 tablet 0  . potassium chloride (K-DUR,KLOR-CON) 10 MEQ tablet Take 10 mEq by mouth 2 (two) times daily.    . pravastatin (PRAVACHOL) 20 MG tablet Take 20 mg by mouth daily.     No current facility-administered medications for this visit.    OBJECTIVE: BP 203/107 mmHg  Pulse 85  Temp(Src) 97.8 F (36.6 C) (Tympanic)  Resp 20  Wt 242 lb 4.6 oz (109.9 kg)   Body mass index is 40.32 kg/(m^2).    ECOG FS:0 - Asymptomatic  General: Well-developed, well-nourished, no acute distress. Eyes: Pink conjunctiva, anicteric sclera. HEENT: Normocephalic, moist mucous membranes, clear oropharnyx. Lungs: Clear to auscultation bilaterally. Heart: Regular rate and rhythm. No rubs, murmurs, or gallops. Abdomen: Soft, nontender, nondistended. No organomegaly noted, normoactive bowel sounds. Musculoskeletal: No edema, cyanosis, or clubbing. Neuro: Alert, answering all questions appropriately. Cranial nerves grossly intact. Skin: No rashes or petechiae noted. Psych: Normal affect. Lymphatics: No cervical, calvicular, axillary or inguinal LAD.   LAB RESULTS:  Appointment on 11/26/2014  Component Date Value Ref Range Status  . Hemoglobin 11/26/2014 13.0  12.0 - 16.0 g/dL Final    STUDIES: No results found.  ASSESSMENT:  T4 N0 M1 colon cancer. K-ras wild type, BRAF not mutated.  PLAN:     1. Colon cancer. Patient's CEA has been stable for several years now. She is closely followed by primary care provider. At patient's request she would like to decrease frequency of visits to every 6 months due to financial burden. Patient is also requesting to have Port-A-Cath removed. We will await most recent CEA and CA 125 labs before sending referral for removal of Port-A-Cath to Dr. Marijean Heath office. We'll continue with follow-up in 6 months. Patient has been instructed that if she has any new symptoms she is to contact us immediately. She was also advised that her next colonoscopy is due this year, last performed in 2014.  Patient expressed understanding and was in agreement with this plan. She also understands that She can call clinic at any time with any questions, concerns, or complaints.   Dr. Doylene Canning was available for consultation and review of plan of care for this patient.  Evlyn Kanner, NP   11/26/2014 3:09 PM

## 2014-11-27 ENCOUNTER — Other Ambulatory Visit: Payer: Self-pay | Admitting: Family Medicine

## 2014-11-27 LAB — CEA (SERIAL MONITOR): CEA: 5.9 ng/mL — AB (ref 0.0–4.7)

## 2014-11-27 LAB — CA 125: CA 125: 6.1 U/mL (ref 0.0–38.1)

## 2014-12-03 ENCOUNTER — Inpatient Hospital Stay: Payer: Medicare Other | Attending: Family Medicine

## 2014-12-03 DIAGNOSIS — C786 Secondary malignant neoplasm of retroperitoneum and peritoneum: Secondary | ICD-10-CM | POA: Diagnosis not present

## 2014-12-03 DIAGNOSIS — C187 Malignant neoplasm of sigmoid colon: Secondary | ICD-10-CM | POA: Diagnosis present

## 2014-12-03 DIAGNOSIS — C801 Malignant (primary) neoplasm, unspecified: Secondary | ICD-10-CM

## 2014-12-03 DIAGNOSIS — Z452 Encounter for adjustment and management of vascular access device: Secondary | ICD-10-CM | POA: Diagnosis not present

## 2014-12-03 MED ORDER — SODIUM CHLORIDE 0.9 % IJ SOLN
10.0000 mL | INTRAMUSCULAR | Status: DC | PRN
  Administered 2014-12-03: 10 mL via INTRAVENOUS
  Filled 2014-12-03: qty 10

## 2014-12-03 MED ORDER — HEPARIN SOD (PORK) LOCK FLUSH 100 UNIT/ML IV SOLN
500.0000 [IU] | Freq: Once | INTRAVENOUS | Status: AC
  Administered 2014-12-03: 500 [IU] via INTRAVENOUS

## 2014-12-03 MED ORDER — HEPARIN SOD (PORK) LOCK FLUSH 100 UNIT/ML IV SOLN
INTRAVENOUS | Status: AC
  Filled 2014-12-03: qty 5

## 2014-12-22 DIAGNOSIS — R0602 Shortness of breath: Secondary | ICD-10-CM | POA: Insufficient documentation

## 2014-12-27 ENCOUNTER — Other Ambulatory Visit: Payer: Self-pay | Admitting: Family Medicine

## 2014-12-28 ENCOUNTER — Encounter: Payer: Self-pay | Admitting: Family Medicine

## 2015-01-04 ENCOUNTER — Telehealth: Payer: Self-pay | Admitting: *Deleted

## 2015-01-04 NOTE — Telephone Encounter (Signed)
I called Priscilla Villanueva and informed her that I would have to resend the referral for her port removal to Dr. Nino Parsley office; and they should contact her sometime tomorrow with an appointment..Priscilla Villanueva was agreeable to this.

## 2015-01-04 NOTE — Telephone Encounter (Signed)
Called to inquire about having port removed, asking when appt is going to be arranged

## 2015-01-05 ENCOUNTER — Telehealth: Payer: Self-pay | Admitting: *Deleted

## 2015-01-05 NOTE — Telephone Encounter (Signed)
Referral for port removal faxed to Hortencia Pilar, MD with [facility] at [phone]. This is the second resend; the first referral was sent on 11/26/14. Also faxed were patient's recent progress note and demographics/insurance.. I informed pt that I would f/u on this yesterday.Marland KitchenMarland Kitchen

## 2015-01-07 ENCOUNTER — Encounter: Payer: Self-pay | Admitting: Family Medicine

## 2015-01-26 ENCOUNTER — Encounter: Payer: Self-pay | Admitting: *Deleted

## 2015-01-26 ENCOUNTER — Ambulatory Visit
Admission: RE | Admit: 2015-01-26 | Discharge: 2015-01-26 | Disposition: A | Discharge status: Home / Self Care | Payer: Medicare Other | Source: Ambulatory Visit | Attending: Vascular Surgery | Admitting: Vascular Surgery

## 2015-01-26 ENCOUNTER — Encounter
Admission: RE | Disposition: A | Discharge status: Home / Self Care | Payer: Self-pay | Source: Ambulatory Visit | Attending: Vascular Surgery

## 2015-01-26 DIAGNOSIS — I10 Essential (primary) hypertension: Secondary | ICD-10-CM | POA: Insufficient documentation

## 2015-01-26 DIAGNOSIS — Z79899 Other long term (current) drug therapy: Secondary | ICD-10-CM | POA: Insufficient documentation

## 2015-01-26 DIAGNOSIS — Z452 Encounter for adjustment and management of vascular access device: Secondary | ICD-10-CM | POA: Insufficient documentation

## 2015-01-26 DIAGNOSIS — Z87891 Personal history of nicotine dependence: Secondary | ICD-10-CM | POA: Diagnosis not present

## 2015-01-26 DIAGNOSIS — I509 Heart failure, unspecified: Secondary | ICD-10-CM | POA: Diagnosis not present

## 2015-01-26 DIAGNOSIS — Z85038 Personal history of other malignant neoplasm of large intestine: Secondary | ICD-10-CM | POA: Insufficient documentation

## 2015-01-26 DIAGNOSIS — Z7982 Long term (current) use of aspirin: Secondary | ICD-10-CM | POA: Diagnosis not present

## 2015-01-26 DIAGNOSIS — E119 Type 2 diabetes mellitus without complications: Secondary | ICD-10-CM | POA: Insufficient documentation

## 2015-01-26 HISTORY — PX: PERIPHERAL VASCULAR CATHETERIZATION: SHX172C

## 2015-01-26 LAB — GLUCOSE, CAPILLARY: Glucose-Capillary: 121 mg/dL — ABNORMAL HIGH (ref 65–99)

## 2015-01-26 SURGERY — PORTA CATH REMOVAL
Anesthesia: Moderate Sedation

## 2015-01-26 MED ORDER — OXYCODONE HCL 5 MG PO TABS: 5.0000 mg | ORAL_TABLET | ORAL | Status: DC | PRN

## 2015-01-26 MED ORDER — HYDRALAZINE HCL 20 MG/ML IJ SOLN
INTRAMUSCULAR | Status: AC
  Administered 2015-01-26: 10 mg
  Filled 2015-01-26: qty 1

## 2015-01-26 MED ORDER — CEFAZOLIN SODIUM 1-5 GM-% IV SOLN
INTRAVENOUS | Status: AC
  Filled 2015-01-26: qty 50

## 2015-01-26 MED ORDER — MORPHINE SULFATE (PF) 4 MG/ML IV SOLN: 2.0000 mg | INTRAVENOUS | Status: DC | PRN

## 2015-01-26 MED ORDER — ACETAMINOPHEN 325 MG RE SUPP: 325.0000 mg | RECTAL | Status: DC | PRN

## 2015-01-26 MED ORDER — MIDAZOLAM HCL 5 MG/5ML IJ SOLN
INTRAMUSCULAR | Status: AC
  Filled 2015-01-26: qty 5

## 2015-01-26 MED ORDER — FENTANYL CITRATE (PF) 100 MCG/2ML IJ SOLN
INTRAMUSCULAR | Status: DC | PRN
  Administered 2015-01-26: 50 ug via INTRAVENOUS
  Administered 2015-01-26: 100 ug via INTRAVENOUS

## 2015-01-26 MED ORDER — SODIUM CHLORIDE 0.9 % IV SOLN
INTRAVENOUS | Status: DC
  Administered 2015-01-26 (×2): via INTRAVENOUS

## 2015-01-26 MED ORDER — FENTANYL CITRATE (PF) 100 MCG/2ML IJ SOLN
INTRAMUSCULAR | Status: AC
  Filled 2015-01-26: qty 2

## 2015-01-26 MED ORDER — HEPARIN (PORCINE) IN NACL 2-0.9 UNIT/ML-% IJ SOLN
INTRAMUSCULAR | Status: AC
  Filled 2015-01-26: qty 500

## 2015-01-26 MED ORDER — ACETAMINOPHEN 325 MG PO TABS: 325.0000 mg | ORAL_TABLET | ORAL | Status: DC | PRN

## 2015-01-26 MED ORDER — MIDAZOLAM HCL 2 MG/2ML IJ SOLN
INTRAMUSCULAR | Status: DC | PRN
  Administered 2015-01-26: 1 mg via INTRAVENOUS
  Administered 2015-01-26: 3 mg via INTRAVENOUS

## 2015-01-26 MED ORDER — LIDOCAINE-EPINEPHRINE (PF) 1 %-1:200000 IJ SOLN
INTRAMUSCULAR | Status: AC
  Filled 2015-01-26: qty 30

## 2015-01-26 MED ORDER — CEFAZOLIN SODIUM 1-5 GM-% IV SOLN
1.0000 g | Freq: Once | INTRAVENOUS | Status: AC
  Administered 2015-01-26: 1 g via INTRAVENOUS

## 2015-01-26 SURGICAL SUPPLY — 6 items
ADH SKN CLS APL DERMABOND .7 (GAUZE/BANDAGES/DRESSINGS) ×1
DERMABOND ADVANCED (GAUZE/BANDAGES/DRESSINGS) ×2
DERMABOND ADVANCED .7 DNX12 (GAUZE/BANDAGES/DRESSINGS) IMPLANT
PACK ANGIOGRAPHY (CUSTOM PROCEDURE TRAY) ×2 IMPLANT
PREP CHG 10.5 TEAL (MISCELLANEOUS) ×2 IMPLANT
TOWEL OR 17X26 4PK STRL BLUE (TOWEL DISPOSABLE) ×2 IMPLANT

## 2015-01-26 NOTE — H&P (Signed)
[facility] History & Physical Update  The patient was interviewed and re-examined.  The patient's previous History and Physical has been reviewed and is unchanged.  There is no change in the plan of care. We plan to proceed with the scheduled procedure.  Heitor Steinhoff, Dolores Lory, MD  01/26/2015, 12:03 PM

## 2015-01-26 NOTE — Op Note (Signed)
  OPERATIVE NOTE   PROCEDURE: Removal of Infuse-a-Port, right IJ position  PRE-OPERATIVE DIAGNOSIS: History of colon cancer  POST-OPERATIVE DIAGNOSIS: Same  SURGEON: Antaeus Karel, Dolores Lory, M.D. ASSISTANT(S): None  ANESTHESIA: IV sedation  ESTIMATED BLOOD LOSS: Minimal  FINDING(S): 1.  Intact port  SPECIMEN(S):  Port intact  INDICATIONS:   Priscilla Villanueva is a 64 y.o. y.o. female who presents with request for removal of the port. She is now 6+ years status post cancer treatment. She is not using her port and in fact insurance has stopped paying for port flushes she is therefore requested that the port be removed.   DESCRIPTION: After obtaining full informed written consent, the patient is brought to special procedures and positioned supine.  The patient received IV antibiotics.  The patient was prepped and draped in the standard fashion appropriate time out is called.    After infiltrating 1% lidocaine with epinephrine into the soft tissues and skin surrounding the port the previous incisional scar is reopened with an 11 blade scalpel.  The port is slipped from the pocket and subsequently removed without difficulty otherwise intact.  Pressure is held at the base of the neck for 5 minutes, a pocket is irrigated. Subtotally the wound is packed with saline moistened gauze and sterile dressings applied.  The patient tolerated the procedure without changes in her overall condition she remains critically ill in guarded condition.  COMPLICATIONS: None  CONDITION: Gerhard Munch, Dolores Lory, M.D. [facility] Office: [phone]   01/26/2015, 2:09 PM

## 2015-01-27 ENCOUNTER — Encounter: Payer: Self-pay | Admitting: Vascular Surgery

## 2015-01-28 ENCOUNTER — Other Ambulatory Visit: Payer: Self-pay | Admitting: Family Medicine

## 2015-02-03 ENCOUNTER — Other Ambulatory Visit: Payer: Self-pay | Admitting: *Deleted

## 2015-02-03 NOTE — Telephone Encounter (Signed)
We have not checked a potassium level on her since 05/28/14, last OV was 06/2014, next visit is 04/2015. She sees Dr Ginette Pitman and had labs drawn at [facility] 09/24/14, her K+ was 3.4 there. Do you want to refill her potassium or defer to Dr Ginette Pitman to manage

## 2015-02-03 NOTE — Telephone Encounter (Signed)
Faxed back to pharmacy to send request to Dr Ginette Pitman per Isa Rankin, AGNP-C

## 2015-05-27 ENCOUNTER — Other Ambulatory Visit: Payer: Self-pay | Admitting: *Deleted

## 2015-05-27 DIAGNOSIS — C189 Malignant neoplasm of colon, unspecified: Secondary | ICD-10-CM

## 2015-05-28 ENCOUNTER — Inpatient Hospital Stay: Payer: Medicare Other

## 2015-06-11 ENCOUNTER — Encounter: Payer: Self-pay | Admitting: Internal Medicine

## 2015-06-11 ENCOUNTER — Inpatient Hospital Stay: Payer: Medicare Other | Attending: Internal Medicine

## 2015-06-11 ENCOUNTER — Inpatient Hospital Stay (HOSPITAL_BASED_OUTPATIENT_CLINIC_OR_DEPARTMENT_OTHER): Payer: Medicare Other | Admitting: Internal Medicine

## 2015-06-11 VITALS — BP 171/91 | HR 70 | Temp 97.0°F | Resp 18 | Ht 64.0 in | Wt 247.8 lb

## 2015-06-11 DIAGNOSIS — I10 Essential (primary) hypertension: Secondary | ICD-10-CM | POA: Insufficient documentation

## 2015-06-11 DIAGNOSIS — E119 Type 2 diabetes mellitus without complications: Secondary | ICD-10-CM | POA: Diagnosis not present

## 2015-06-11 DIAGNOSIS — Z7982 Long term (current) use of aspirin: Secondary | ICD-10-CM | POA: Diagnosis not present

## 2015-06-11 DIAGNOSIS — Z7984 Long term (current) use of oral hypoglycemic drugs: Secondary | ICD-10-CM | POA: Diagnosis not present

## 2015-06-11 DIAGNOSIS — Z87891 Personal history of nicotine dependence: Secondary | ICD-10-CM | POA: Diagnosis not present

## 2015-06-11 DIAGNOSIS — Z79899 Other long term (current) drug therapy: Secondary | ICD-10-CM | POA: Insufficient documentation

## 2015-06-11 DIAGNOSIS — I509 Heart failure, unspecified: Secondary | ICD-10-CM | POA: Insufficient documentation

## 2015-06-11 DIAGNOSIS — Z85038 Personal history of other malignant neoplasm of large intestine: Secondary | ICD-10-CM

## 2015-06-11 DIAGNOSIS — Z794 Long term (current) use of insulin: Secondary | ICD-10-CM | POA: Insufficient documentation

## 2015-06-11 DIAGNOSIS — E7800 Pure hypercholesterolemia, unspecified: Secondary | ICD-10-CM | POA: Diagnosis not present

## 2015-06-11 DIAGNOSIS — C189 Malignant neoplasm of colon, unspecified: Secondary | ICD-10-CM

## 2015-06-11 DIAGNOSIS — C187 Malignant neoplasm of sigmoid colon: Secondary | ICD-10-CM

## 2015-06-11 LAB — HEMOGLOBIN: HEMOGLOBIN: 12.5 g/dL (ref 12.0–16.0)

## 2015-06-11 NOTE — Progress Notes (Signed)
Patient here for follow up colon cancer. States she has 2-3 stools a day, diarrhea. States this is her normal. Denies pain, nausea or vomiting.

## 2015-06-11 NOTE — Progress Notes (Signed)
[facility]  [phone]  [phone]     Priscilla Villanueva DOB: August 24, 1950  MR#: [id number]  UQX#:[id number]  Patient Care Team: Tracie Harrier, MD as PCP - General (Internal Medicine)  CHIEF COMPLAINT:  No chief complaint on file.   INTERVAL HISTORY:  Patient is here for continued follow-up regarding locally advanced colon cancer diagnosed in 2009.  Since her last visit Priscilla Villanueva had Port-A-Cath removed. She has had no significant health-related issues or concerns. She specifically denies abdominal pain, weight loss, bleeding from any source, chest pain, shortness of breath, jaundice. She continues to have loose bowel movements up to 4 a day 3-4 times a week, but those are chronic.  REVIEW OF SYSTEMS:   Review of Systems  Constitutional: Negative for fever, chills, weight loss, malaise/fatigue and diaphoresis.  HENT: Negative for congestion, ear discharge, ear pain, hearing loss, nosebleeds, sore throat and tinnitus.   Eyes: Negative for blurred vision, double vision, photophobia, pain, discharge and redness.  Respiratory: Negative for cough, hemoptysis, sputum production, shortness of breath, wheezing and stridor.   Cardiovascular: Negative for chest pain, palpitations, orthopnea, claudication, leg swelling and PND.  Gastrointestinal: Negative for heartburn, nausea, vomiting, abdominal pain, diarrhea, constipation, blood in stool and melena.  Genitourinary: Negative.   Musculoskeletal: Negative.   Skin: Negative.   Neurological: Negative for dizziness, tingling, focal weakness, seizures, weakness and headaches.  Endo/Heme/Allergies: Does not bruise/bleed easily.  Psychiatric/Behavioral: Negative for depression. The patient is not nervous/anxious and does not have insomnia.     As per HPI. Otherwise, a complete review of systems is negatve.  ONCOLOGY HISTORY: Oncology History   1.T4 N0 M1 tumor implants in the small bowel and direct extension to  the retroperitoneum sigmoid colon cancer status post excision/hartman procedure, drainage of abscess, patient considered but ultimately due to uncontrolled BP not eligible for protocol therapy for metastatic unresectable disease. wild type K-ras,  neg braf mutation. Chemotx given, completed 05/2008 2. Family history colorectal cancer and young age her mid 70s appropriate for genetic counseling, advised 3. Absence of measurable or evaluable disease a CT was negative for metastatic disease CEA was initially low, 0.5 by bayer assay, there are tiny lesions in liver determined on review to be cysts, f/u CT after 8 cycles, fatty liver, borderline enlarged subcarinal nodes, abnormal area scar vs metastatic/persistent disease,   CEA elevated since 07/2008, some waxing and waning, was 5.9 07/2008, 6.1 08/2009,  7.2 02/2010,  7.8 IN 4/12, 10.6 6/13,  6.4 12/08/11 . CT stable abdomen and pelvis 02/2009, then  scan 08/2010 and 08/2011. No recurrence.  Residual ovarian tissue. Reviewed with Dr Burt Knack in radiology, no change 08/2010 to 08/2011, best  on CT with oral contrast  STABLE 01/2012, and 07/2012, HAD ADVISED TO CHECK STABILITY FOR 2 YRS NOW DONE    4. Colonoscopy normal 05/2009 and  10/2010.,and 07/2012, ALSO EGD 07/2012 5. Status post anemia due to iron deficiency blood loss treated with transfusion during hospitalization /09 s/p reversal of colostomy 08/05/08,,,,Note epistaxis 3/13 with drop hgb to 10.3 from 12.6, then residual low iron sat 17%  09/29/11, 12/08/11 iron sat 11% and ferritin slightly lower. stools guiac x 3 negative 7/13 and 10/13, and 12/2013 . Also, patient has chronically low MCV, apparently baseline hemoglobin is 12.2 MCV 76 in July 2012  6. Mild elevation LFT, wax and wane, stable since 08/2009     Colon cancer ([place])   03/29/2008 Initial Diagnosis Colon cancer    PAST MEDICAL HISTORY: Past Medical History  Diagnosis Date  . Hypertension   .  CHF (congestive heart failure)   . Diabetes mellitus without complication   . Hypercholesteremia   . Colon cancer 2009    with chemotherapy  . Colon cancer 11/26/2014    PAST SURGICAL HISTORY: Past Surgical History  Procedure Laterality Date  . Abdominal hysterectomy      PARTIAL  . Upper gi endoscopy    . Colectomy    . Peripheral vascular catheterization N/A 01/26/2015    Procedure: Porta Cath Removal;  Surgeon: Katha Cabal, MD;  Location: [facility];  Service: Cardiovascular;  Laterality: N/A;    FAMILY HISTORY Family History  Problem Relation Age of Onset  . Liver cancer Mother     "had some type of tumor in the vulva liver unknown primary"  . Colon cancer Brother     "died at young age"    GYNECOLOGIC HISTORY:  No LMP recorded. Patient has had a hysterectomy.     ADVANCED DIRECTIVES:    HEALTH MAINTENANCE: Social History  Substance Use Topics  . Smoking status: Former Smoker -- 1.00 packs/day for 3 years  . Smokeless tobacco: Never Used  . Alcohol Use: No     Colonoscopy:  PAP:  Bone density:  Lipid panel:  Allergies  Allergen Reactions  . Amlodipine Swelling    Causes pt's feet and legs to swell...    Current Outpatient Prescriptions  Medication Sig Dispense Refill  . allopurinol (ZYLOPRIM) 100 MG tablet Take 100 mg by mouth 2 (two) times daily.     Marland Kitchen ALPRAZolam (XANAX) 0.25 MG tablet Take 0.25 mg by mouth at bedtime as needed for anxiety.    Marland Kitchen aspirin 81 MG EC tablet Take 81 mg by mouth daily. Swallow whole.    . b complex vitamins capsule Take 1 capsule by mouth daily.    . cloNIDine (CATAPRES) 0.3 MG tablet 0.3 mg 3 (three) times daily.     . dapagliflozin propanediol (FARXIGA) 5 MG TABS tablet Take 5 mg by mouth daily.    . ferrous sulfate 325 (65 FE) MG EC tablet Take 325 mg by mouth every morning.    . furosemide (LASIX) 40 MG tablet Take 40 mg by mouth 2 (two) times daily.    Marland Kitchen HUMALOG MIX 75/25 KWIKPEN (75-25) 100 UNIT/ML  Kwikpen     . hydrALAZINE (APRESOLINE) 50 MG tablet Take 100 mg by mouth 3 (three) times daily.     . insulin lispro (HUMALOG) 100 UNIT/ML injection Inject 32 Units into the skin 2 (two) times daily before a meal.    . lidocaine-prilocaine (EMLA) cream Apply 1 application topically as needed. Apply at least 1 hour prior to port being accessed 30 g 1  . Magnesium 250 MG TABS Take 2 tablets by mouth 2 (two) times daily.    . magnesium oxide (MAG-OX) 400 MG tablet Take 400 mg by mouth daily.    . metFORMIN (GLUCOPHAGE) 1000 MG tablet Take 1,000 mg by mouth 2 (two) times daily with a meal.    . metoprolol (TOPROL-XL) 200 MG 24 hr tablet Take 200 mg by mouth daily.    Marland Kitchen olmesartan (BENICAR) 40 MG tablet Take 40 mg by mouth daily.    . potassium chloride (K-DUR) 10 MEQ tablet TAKE ONE  TABLET BY MOUTH TWICE DAILY 60 tablet 0  . potassium chloride (K-DUR,KLOR-CON) 10 MEQ tablet Take 10 mEq by mouth daily.     . pravastatin (PRAVACHOL) 20 MG tablet Take 20 mg by mouth daily.     No current facility-administered medications for this visit.    OBJECTIVE: BP 171/91 mmHg  Pulse 70  Temp(Src) 97 F (36.1 C) (Tympanic)  Resp 18  Ht 5' 4" (1.626 m)  Wt 247 lb 12.8 oz (112.4 kg)  BMI 42.51 kg/m2  SpO2 97%   Body mass index is 42.51 kg/(m^2).    ECOG FS:0 - Asymptomatic  General: Well-developed, morbidly obese African-American female, no acute distress. Eyes: Pink conjunctiva, anicteric sclera. HEENT: Normocephalic, moist mucous membranes, clear oropharnyx. Lungs: Clear to auscultation bilaterally. Heart: Regular rate and rhythm. No rubs, murmurs, or gallops. Abdomen: Soft, nontender, nondistended. No organomegaly noted, normoactive bowel sounds. Musculoskeletal: Bilateral 2+ pitting edema, no cyanosis, or clubbing. Neuro: Alert, answering all questions appropriately. Cranial nerves grossly intact. Skin: No rashes or petechiae noted. Large postsurgical scars on abdominal surface Psych: Normal  affect. Lymphatics: No cervical, calvicular, axillary or inguinal LAD.   LAB RESULTS:  Appointment on 06/11/2015  Component Date Value Ref Range Status  . Hemoglobin 06/11/2015 12.5  12.0 - 16.0 g/dL Final    STUDIES: No results found.  ASSESSMENT:  T4 N0 M1 colon cancer. K-ras wild type, BRAF not mutated.  PLAN:   1. Colon cancer (T4 N0 M1 tumor implants in the small bowel and direct extension to the retroperitoneum sigmoid colon cancer status post excision/hartman procedure) in 2009. Adjuvant chemotherapy completed in July 2010. Patient's clinical status and CEA have been stable for more than 6 years now. She is closely followed by primary care provider. She had a porta catheter removed last year. At this point it is not necessary for Priscilla Villanueva to continue follow-up with medical oncology clinic. She should continue with annual mammograms and colonoscopies as previously planned. She should continue close follow-up with the primary care physician and she can be referred to our clinic again with any concerns or worrisome developments. She could have CEA checked once a year at primary care physician's office. Patient expressed understanding and was in agreement with this plan. She also understands that She can call clinic at any time with any questions, concerns, or complaints.      Roxana Hires, MD   06/11/2015 10:57 AM

## 2015-06-12 LAB — CEA: CEA: 5.6 ng/mL — ABNORMAL HIGH (ref 0.0–4.7)

## 2015-08-30 ENCOUNTER — Other Ambulatory Visit: Payer: Self-pay | Admitting: Internal Medicine

## 2015-08-30 DIAGNOSIS — Z1231 Encounter for screening mammogram for malignant neoplasm of breast: Secondary | ICD-10-CM

## 2015-10-08 ENCOUNTER — Ambulatory Visit
Admission: RE | Admit: 2015-10-08 | Discharge: 2015-10-08 | Disposition: A | Discharge status: Home / Self Care | Payer: Medicare Other | Source: Ambulatory Visit | Attending: Internal Medicine | Admitting: Internal Medicine

## 2015-10-08 DIAGNOSIS — Z1231 Encounter for screening mammogram for malignant neoplasm of breast: Secondary | ICD-10-CM

## 2015-10-12 ENCOUNTER — Other Ambulatory Visit: Payer: Self-pay | Admitting: Internal Medicine

## 2015-10-12 ENCOUNTER — Ambulatory Visit
Admission: RE | Admit: 2015-10-12 | Discharge: 2015-10-12 | Disposition: A | Discharge status: Home / Self Care | Payer: Medicare Other | Source: Ambulatory Visit | Attending: Internal Medicine | Admitting: Internal Medicine

## 2015-10-12 DIAGNOSIS — Z1231 Encounter for screening mammogram for malignant neoplasm of breast: Secondary | ICD-10-CM | POA: Diagnosis not present

## 2016-02-08 ENCOUNTER — Encounter: Payer: Medicare Other | Attending: Cardiology | Admitting: *Deleted

## 2016-02-08 VITALS — Ht 64.75 in | Wt 242.0 lb

## 2016-02-08 DIAGNOSIS — Z85038 Personal history of other malignant neoplasm of large intestine: Secondary | ICD-10-CM | POA: Insufficient documentation

## 2016-02-08 DIAGNOSIS — I5032 Chronic diastolic (congestive) heart failure: Secondary | ICD-10-CM | POA: Insufficient documentation

## 2016-02-08 DIAGNOSIS — Z87891 Personal history of nicotine dependence: Secondary | ICD-10-CM | POA: Insufficient documentation

## 2016-02-08 DIAGNOSIS — E7800 Pure hypercholesterolemia, unspecified: Secondary | ICD-10-CM | POA: Insufficient documentation

## 2016-02-08 DIAGNOSIS — I110 Hypertensive heart disease with heart failure: Secondary | ICD-10-CM | POA: Insufficient documentation

## 2016-02-08 DIAGNOSIS — E119 Type 2 diabetes mellitus without complications: Secondary | ICD-10-CM | POA: Diagnosis not present

## 2016-02-08 DIAGNOSIS — F419 Anxiety disorder, unspecified: Secondary | ICD-10-CM | POA: Insufficient documentation

## 2016-02-08 DIAGNOSIS — F329 Major depressive disorder, single episode, unspecified: Secondary | ICD-10-CM | POA: Diagnosis not present

## 2016-02-08 DIAGNOSIS — G4730 Sleep apnea, unspecified: Secondary | ICD-10-CM | POA: Diagnosis not present

## 2016-02-08 LAB — GLUCOSE, CAPILLARY: GLUCOSE-CAPILLARY: 81 mg/dL (ref 65–99)

## 2016-02-08 NOTE — Progress Notes (Signed)
Pulmonary Individual Treatment Plan  Patient Details  Name: Priscilla Villanueva MRN: [id number] Date of Birth: [birth year] Referring Provider:   Flowsheet Row Pulmonary Rehab from 02/08/2016 in [facility] Cardiac and Pulmonary Rehab  Referring Provider  Paraschos      Initial Encounter Date:  Flowsheet Row Pulmonary Rehab from 02/08/2016 in [facility] Cardiac and Pulmonary Rehab  Date  02/08/16  Referring Provider  Paraschos      Visit Diagnosis: Heart failure, diastolic, chronic ([place])  Patient's Home Medications on Admission:  Current Outpatient Prescriptions:    allopurinol (ZYLOPRIM) 100 MG tablet, Take 100 mg by mouth 2 (two) times daily. , Disp: , Rfl:    ALPRAZolam (XANAX) 0.25 MG tablet, Take 0.25 mg by mouth at bedtime as needed for anxiety., Disp: , Rfl:    aspirin 81 MG EC tablet, Take 81 mg by mouth daily. Swallow whole., Disp: , Rfl:    b complex vitamins capsule, Take 1 capsule by mouth daily., Disp: , Rfl:    cloNIDine (CATAPRES) 0.3 MG tablet, 0.3 mg 3 (three) times daily. , Disp: , Rfl:    colchicine 0.6 MG tablet, Take 2 tablets by mouth as needed., Disp: , Rfl:    ferrous sulfate 325 (65 FE) MG EC tablet, Take 325 mg by mouth every morning. Reported on 06/11/2015, Disp: , Rfl:    furosemide (LASIX) 40 MG tablet, Take 40 mg by mouth 2 (two) times daily., Disp: , Rfl:    hydrALAZINE (APRESOLINE) 50 MG tablet, Take 100 mg by mouth 3 (three) times daily. , Disp: , Rfl:    insulin lispro (HUMALOG) 100 UNIT/ML injection, Inject 32 Units into the skin 2 (two) times daily before a meal. Reported on 06/11/2015, Disp: , Rfl:    Magnesium 250 MG TABS, Take 2 tablets by mouth 2 (two) times daily., Disp: , Rfl:    metFORMIN (GLUCOPHAGE) 1000 MG tablet, Take 1,000 mg by mouth 2 (two) times daily with a meal., Disp: , Rfl:    metoprolol (TOPROL-XL) 200 MG 24 hr tablet, Take 200 mg by mouth daily., Disp: , Rfl:    olmesartan (BENICAR) 40 MG tablet, Take 40 mg by mouth daily., Disp: , Rfl:     potassium chloride (K-DUR) 10 MEQ tablet, TAKE ONE TABLET BY MOUTH TWICE DAILY, Disp: 60 tablet, Rfl: 0   pravastatin (PRAVACHOL) 20 MG tablet, Take 20 mg by mouth daily., Disp: , Rfl:    torsemide (DEMADEX) 20 MG tablet, Take 20 mg by mouth daily., Disp: , Rfl:    HUMALOG MIX 75/25 KWIKPEN (75-25) 100 UNIT/ML Kwikpen, , Disp: , Rfl:    sertraline (ZOLOFT) 50 MG tablet, Take 50 mg by mouth daily., Disp: , Rfl:   Past Medical History: Past Medical History:  Diagnosis Date   CHF (congestive heart failure) ([place])    Colon cancer ([place]) 2009   with chemotherapy   Colon cancer ([place]) 11/26/2014   Diabetes mellitus without complication ([place])    Hypercholesteremia    Hypertension     Tobacco Use: History  Smoking Status   Former Smoker   Packs/day: 1.00   Years: 3.00  Smokeless Tobacco   Never Used    Labs: Recent Chemical engineer    Labs for ITP Cardiac and Pulmonary Rehab Latest Ref Rng & Units 02/06/2013 05/27/2013   Cholestrol 0 - 200 mg/dL 178 -   LDLCALC 0 - 100 mg/dL 91 -   HDL 40 - 60 mg/dL 47 -   Trlycerides 0 - 200 mg/dL  200 -   Hemoglobin A1c 4.2 - 6.3 % - 7.4(H)       ADL UCSD:     Pulmonary Assessment Scores    Row Name 02/08/16 1221         ADL UCSD   SOB Score total 20     Rest 0     Walk 1     Stairs 3     Bath 0     Dress 1     Shop 1        Pulmonary Function Assessment:     Pulmonary Function Assessment - 02/08/16 1218      Initial Spirometry Results   FVC% 78 %   FEV1% 92 %   FEV1/FVC Ratio 93.24     Breath   Shortness of Breath Fear of Shortness of Breath;Limiting activity  Fear 2/5 rated      Exercise Target Goals: Date: 02/08/16  Exercise Program Goal: Individual exercise prescription set with THRR, safety & activity barriers. Participant demonstrates ability to understand and report RPE using BORG scale, to self-measure pulse accurately, and to acknowledge the importance of the exercise  prescription.  Exercise Prescription Goal: Starting with aerobic activity 30 plus minutes a day, 3 days per week for initial exercise prescription. Provide home exercise prescription and guidelines that participant acknowledges understanding prior to discharge.  Activity Barriers & Risk Stratification:     Activity Barriers & Cardiac Risk Stratification - 02/08/16 1217      Activity Barriers & Cardiac Risk Stratification   Activity Barriers Arthritis;Shortness of Breath;Deconditioning      6 Minute Walk:     6 Minute Walk    Row Name 02/08/16 1319         6 Minute Walk   Distance 1088 feet     Walk Time 6 minutes     MPH 2.06     METS 2.48     RPE 13     Perceived Dyspnea  3     VO2 Peak 8.7        Initial Exercise Prescription:     Initial Exercise Prescription - 02/08/16 1300      Date of Initial Exercise RX and Referring Provider   Date 02/08/16   Referring Provider Paraschos     Treadmill   MPH 1.9   Grade 0   Minutes 15   METs 2.45     NuStep   Level 2   Minutes 15   METs 2.5     REL-XR   Level 1   Minutes 15   METs 2.5     Prescription Details   Frequency (times per week) 3   Duration Progress to 45 minutes of aerobic exercise without signs/symptoms of physical distress     Intensity   THRR 40-80% of Max Heartrate 110-141   Ratings of Perceived Exertion 11-13   Perceived Dyspnea 0-4     Progression   Progression Continue to progress workloads to maintain intensity without signs/symptoms of physical distress.     Resistance Training   Training Prescription Yes   Weight 2   Reps 10-15      Perform Capillary Blood Glucose checks as needed.  Exercise Prescription Changes:   Exercise Comments:   Discharge Exercise Prescription (Final Exercise Prescription Changes):    Nutrition:  Target Goals: Understanding of nutrition guidelines, daily intake of sodium 1500mg , cholesterol 200mg , calories 30% from fat and 7% or less from  saturated fats, daily to have 5  or more servings of fruits and vegetables.  Biometrics:     Pre Biometrics - 02/08/16 1319      Pre Biometrics   Height 5' 4.75" (1.645 m)   Weight 242 lb (109.8 kg)   Waist Circumference 45.75 inches   Hip Circumference 52.5 inches   Waist to Hip Ratio 0.87 %   BMI (Calculated) 40.7       Nutrition Therapy Plan and Nutrition Goals:     Nutrition Therapy & Goals - 02/08/16 1210      Intervention Plan   Intervention Prescribe, educate and counsel regarding individualized specific dietary modifications aiming towards targeted core components such as weight, hypertension, lipid management, diabetes, heart failure and other comorbidities.   Expected Outcomes Short Term Goal: Understand basic principles of dietary content, such as calories, fat, sodium, cholesterol and nutrients.;Short Term Goal: A plan has been developed with personal nutrition goals set during dietitian appointment.;Long Term Goal: Adherence to prescribed nutrition plan.      Nutrition Discharge: Rate Your Plate Scores:   Psychosocial: Target Goals: Acknowledge presence or absence of depression, maximize coping skills, provide positive support system. Participant is able to verbalize types and ability to use techniques and skills needed for reducing stress and depression.  Initial Review & Psychosocial Screening:     Initial Psych Review & Screening - 02/08/16 1218      Initial Review   Current issues with Current Psychotropic Meds;History of Depression  Zoloft prescribed, not taking because of side effects      Quality of Life Scores:     Quality of Life - 02/08/16 1215      Quality of Life Scores   Health/Function Pre 20.44 %   Socioeconomic Pre 19.43 %   Psych/Spiritual Pre 20.29 %   Family Pre 20.2 %   GLOBAL Pre 20.17 %      PHQ-9: Recent Review Flowsheet Data    Depression screen [facility] 2/9 02/08/2016   Decreased Interest 1   Down, Depressed, Hopeless 1    PHQ - 2 Score 2   Altered sleeping 3    Tired, decreased energy 2   Change in appetite 2   Feeling bad or failure about yourself  0   Trouble concentrating 1   Moving slowly or fidgety/restless 0   Suicidal thoughts 0   PHQ-9 Score 10   Difficult doing work/chores Somewhat difficult      Psychosocial Evaluation and Intervention:   Psychosocial Re-Evaluation:  Education: Education Goals: Education classes will be provided on a weekly basis, covering required topics. Participant will state understanding/return demonstration of topics presented.  Learning Barriers/Preferences:     Learning Barriers/Preferences - 02/08/16 1217      Learning Barriers/Preferences   Learning Barriers None   Learning Preferences None      Education Topics: Initial Evaluation Education: - Verbal, written and demonstration of respiratory meds, RPE/PD scales, oximetry and breathing techniques. Instruction on use of nebulizers and MDIs: cleaning and proper use, rinsing mouth with steroid doses and importance of monitoring MDI activations.   General Nutrition Guidelines/Fats and Fiber: -Group instruction provided by verbal, written material, models and posters to present the general guidelines for heart healthy nutrition. Gives an explanation and review of dietary fats and fiber.   Controlling Sodium/Reading Food Labels: -Group verbal and written material supporting the discussion of sodium use in heart healthy nutrition. Review and explanation with models, verbal and written materials for utilization of the food label.   Exercise Physiology & Risk  Factors: - Group verbal and written instruction with models to review the exercise physiology of the cardiovascular system and associated critical values. Details cardiovascular disease risk factors and the goals associated with each risk factor.   Aerobic Exercise & Resistance Training: - Gives group verbal and written discussion on the health impact  of inactivity. On the components of aerobic and resistive training programs and the benefits of this training and how to safely progress through these programs.   Flexibility, Balance, General Exercise Guidelines: - Provides group verbal and written instruction on the benefits of flexibility and balance training programs. Provides general exercise guidelines with specific guidelines to those with heart or lung disease. Demonstration and skill practice provided.   Stress Management: - Provides group verbal and written instruction about the health risks of elevated stress, cause of high stress, and healthy ways to reduce stress.   Depression: - Provides group verbal and written instruction on the correlation between heart/lung disease and depressed mood, treatment options, and the stigmas associated with seeking treatment.   Exercise & Equipment Safety: - Individual verbal instruction and demonstration of equipment use and safety with use of the equipment. Flowsheet Row Pulmonary Rehab from 02/08/2016 in [facility] Cardiac and Pulmonary Rehab  Date  02/08/16  Educator  sB  Instruction Review Code  2- meets goals/outcomes      Infection Prevention: - Provides verbal and written material to individual with discussion of infection control including proper hand washing and proper equipment cleaning during exercise session. Flowsheet Row Pulmonary Rehab from 02/08/2016 in [facility] Cardiac and Pulmonary Rehab  Date  02/08/16  Educator  SB  Instruction Review Code  2- meets goals/outcomes      Falls Prevention: - Provides verbal and written material to individual with discussion of falls prevention and safety. Flowsheet Row Pulmonary Rehab from 02/08/2016 in [facility] Cardiac and Pulmonary Rehab  Date  02/08/16  Educator  SB  Instruction Review Code  2- meets goals/outcomes      Diabetes: - Individual verbal and written instruction to review signs/symptoms of diabetes, desired ranges of glucose level  fasting, after meals and with exercise. Advice that pre and post exercise glucose checks will be done for 3 sessions at entry of program. Flowsheet Row Pulmonary Rehab from 02/08/2016 in [facility] Cardiac and Pulmonary Rehab  Date  02/08/16  Educator  SB  Instruction Review Code  2- meets goals/outcomes      Chronic Lung Diseases: - Group verbal and written instruction to review new updates, new respiratory medications, new advancements in procedures and treatments. Provide informative websites and "800" numbers of self-education.   Lung Procedures: - Group verbal and written instruction to describe testing methods done to diagnose lung disease. Review the outcome of test results. Describe the treatment choices: Pulmonary Function Tests, ABGs and oximetry.   Energy Conservation: - Provide group verbal and written instruction for methods to conserve energy, plan and organize activities. Instruct on pacing techniques, use of adaptive equipment and posture/positioning to relieve shortness of breath.   Triggers: - Group verbal and written instruction to review types of environmental controls: home humidity, furnaces, filters, dust mite/pet prevention, HEPA vacuums. To discuss weather changes, air quality and the benefits of nasal washing.   Exacerbations: - Group verbal and written instruction to provide: warning signs, infection symptoms, calling MD promptly, preventive modes, and value of vaccinations. Review: effective airway clearance, coughing and/or vibration techniques. Create an Sports administrator.   Oxygen: - Individual and group verbal and written instruction on  oxygen therapy. Includes supplement oxygen, available portable oxygen systems, continuous and intermittent flow rates, oxygen safety, concentrators, and Medicare reimbursement for oxygen.   Respiratory Medications: - Group verbal and written instruction to review medications for lung disease. Drug class, frequency, complications,  importance of spacers, rinsing mouth after steroid MDI's, and proper cleaning methods for nebulizers.   AED/CPR: - Group verbal and written instruction with the use of models to demonstrate the basic use of the AED with the basic ABC's of resuscitation.   Breathing Retraining: - Provides individuals verbal and written instruction on purpose, frequency, and proper technique of diaphragmatic breathing and pursed-lipped breathing. Applies individual practice skills.   Anatomy and Physiology of the Lungs: - Group verbal and written instruction with the use of models to provide basic lung anatomy and physiology related to function, structure and complications of lung disease.   Heart Failure: - Group verbal and written instruction on the basics of heart failure: signs/symptoms, treatments, explanation of ejection fraction, enlarged heart and cardiomyopathy.   Sleep Apnea: - Individual verbal and written instruction to review Obstructive Sleep Apnea. Review of risk factors, methods for diagnosing and types of masks and machines for OSA.   Anxiety: - Provides group, verbal and written instruction on the correlation between heart/lung disease and anxiety, treatment options, and management of anxiety.   Relaxation: - Provides group, verbal and written instruction about the benefits of relaxation for patients with heart/lung disease. Also provides patients with examples of relaxation techniques.   Knowledge Questionnaire Score:     Knowledge Questionnaire Score - 02/08/16 1218      Knowledge Questionnaire Score   Pre Score 5/10       Core Components/Risk Factors/Patient Goals at Admission:     Personal Goals and Risk Factors at Admission - 02/08/16 1210      Core Components/Risk Factors/Patient Goals on Admission    Weight Management Obesity;Weight Maintenance;Yes   Intervention Weight Management: Develop a combined nutrition and exercise program designed to reach desired  caloric intake, while maintaining appropriate intake of nutrient and fiber, sodium and fats, and appropriate energy expenditure required for the weight goal.;Weight Management: Provide education and appropriate resources to help participant work on and attain dietary goals.;Obesity: Provide education and appropriate resources to help participant work on and attain dietary goals.   Admit Weight 236 lb (107 kg)   Goal Weight: Short Term 230 lb (104.3 kg)   Goal Weight: Long Term 180 lb (81.6 kg)   Expected Outcomes Short Term: Continue to assess and modify interventions until short term weight is achieved   Sedentary Yes   Intervention Provide advice, education, support and counseling about physical activity/exercise needs.;Develop an individualized exercise prescription for aerobic and resistive training based on initial evaluation findings, risk stratification, comorbidities and participant's personal goals.   Expected Outcomes Achievement of increased cardiorespiratory fitness and enhanced flexibility, muscular endurance and strength shown through measurements of functional capacity and personal statement of participant.   Increase Strength and Stamina Yes   Intervention Provide advice, education, support and counseling about physical activity/exercise needs.;Develop an individualized exercise prescription for aerobic and resistive training based on initial evaluation findings, risk stratification, comorbidities and participant's personal goals.   Expected Outcomes Achievement of increased cardiorespiratory fitness and enhanced flexibility, muscular endurance and strength shown through measurements of functional capacity and personal statement of participant.   Develop more efficient breathing techniques such as purse lipped breathing and diaphragmatic breathing; and practicing self-pacing with activity Yes   Intervention Provide education,  demonstration and support about specific breathing techniuqes  utilized for more efficient breathing. Include techniques such as pursed lipped breathing, diaphragmatic breathing and self-pacing activity.   Expected Outcomes Short Term: Participant will be able to demonstrate and use breathing techniques as needed throughout daily activities.   Increase knowledge of respiratory medications and ability to use respiratory devices properly  Yes   Intervention Provide education and demonstration as needed of appropriate use of medications, inhalers, and oxygen therapy.   Expected Outcomes Short Term: Achieves understanding of medications use. Understands that oxygen is a medication prescribed by physician. Demonstrates appropriate use of inhaler and oxygen therapy.   Diabetes Yes   Intervention Provide education about signs/symptoms and action to take for hypo/hyperglycemia.;Provide education about proper nutrition, including hydration, and aerobic/resistive exercise prescription along with prescribed medications to achieve blood glucose in normal ranges: Fasting glucose 65-99 mg/dL   Expected Outcomes Short Term: Participant verbalizes understanding of the signs/symptoms and immediate care of hyper/hypoglycemia, proper foot care and importance of medication, aerobic/resistive exercise and nutrition plan for blood glucose control.;Long Term: Attainment of HbA1C < 7%.   Heart Failure Yes   Intervention Provide a combined exercise and nutrition program that is supplemented with education, support and counseling about heart failure. Directed toward relieving symptoms such as shortness of breath, decreased exercise tolerance, and extremity edema.   Expected Outcomes Improve functional capacity of life   Hypertension Yes   Intervention Provide education on lifestyle modifcations including regular physical activity/exercise, weight management, moderate sodium restriction and increased consumption of fresh fruit, vegetables, and low fat dairy, alcohol moderation, and smoking  cessation.;Monitor prescription use compliance.   Expected Outcomes Short Term: Continued assessment and intervention until BP is < 140/39mm HG in hypertensive participants. < 130/58mm HG in hypertensive participants with diabetes, heart failure or chronic kidney disease.;Long Term: Maintenance of blood pressure at goal levels.   Lipids Yes   Intervention Provide education and support for participant on nutrition & aerobic/resistive exercise along with prescribed medications to achieve LDL 70mg , HDL >40mg .   Expected Outcomes Short Term: Participant states understanding of desired cholesterol values and is compliant with medications prescribed. Participant is following exercise prescription and nutrition guidelines.;Long Term: Cholesterol controlled with medications as prescribed, with individualized exercise RX and with personalized nutrition plan. Value goals: LDL < 70mg , HDL > 40 mg.      Core Components/Risk Factors/Patient Goals Review:    Core Components/Risk Factors/Patient Goals at Discharge (Final Review):    ITP Comments:     ITP Comments    Row Name 02/08/16 1203           ITP Comments ITP created during medical review . Diagnosis documentation found in [place] Visit 11/01/2015 CARE EVERYWHERE          Comments: Ms Lambson plans to start [place] on 02/11/16 and attend 3 days/week.

## 2016-02-08 NOTE — Patient Instructions (Signed)
Patient Instructions  Patient Details  Name: Priscilla Villanueva MRN: [id number] Date of Birth: [birth year] Referring Provider:  Isaias Cowman, MD  Below are the personal goals you chose as well as exercise and nutrition goals. Our goal is to help you keep on track towards obtaining and maintaining your goals. We will be discussing your progress on these goals with you throughout the program.  Initial Exercise Prescription:     Initial Exercise Prescription - 02/08/16 1300      Date of Initial Exercise RX and Referring Provider   Date 02/08/16   Referring Provider Paraschos     Treadmill   MPH 1.9   Grade 0   Minutes 15   METs 2.45     NuStep   Level 2   Minutes 15   METs 2.5     REL-XR   Level 1   Minutes 15   METs 2.5     Prescription Details   Frequency (times per week) 3   Duration Progress to 45 minutes of aerobic exercise without signs/symptoms of physical distress     Intensity   THRR 40-80% of Max Heartrate 110-141   Ratings of Perceived Exertion 11-13   Perceived Dyspnea 0-4     Progression   Progression Continue to progress workloads to maintain intensity without signs/symptoms of physical distress.     Resistance Training   Training Prescription Yes   Weight 2   Reps 10-15      Exercise Goals: Frequency: Be able to perform aerobic exercise three times per week working toward 3-5 days per week.  Intensity: Work with a perceived exertion of 11 (fairly light) - 15 (hard) as tolerated. Follow your new exercise prescription and watch for changes in prescription as you progress with the program. Changes will be reviewed with you when they are made.  Duration: You should be able to do 30 minutes of continuous aerobic exercise in addition to a 5 minute warm-up and a 5 minute cool-down routine.  Nutrition Goals: Your personal nutrition goals will be established when you do your nutrition analysis with the dietician.  The following are nutrition  guidelines to follow: Cholesterol < 200mg /day Sodium < 1500mg /day Fiber: Women over 50 yrs - 21 grams per day  Personal Goals:     Personal Goals and Risk Factors at Admission - 02/08/16 1210      Core Components/Risk Factors/Patient Goals on Admission    Weight Management Obesity;Weight Maintenance;Yes   Intervention Weight Management: Develop a combined nutrition and exercise program designed to reach desired caloric intake, while maintaining appropriate intake of nutrient and fiber, sodium and fats, and appropriate energy expenditure required for the weight goal.;Weight Management: Provide education and appropriate resources to help participant work on and attain dietary goals.;Obesity: Provide education and appropriate resources to help participant work on and attain dietary goals.   Admit Weight 236 lb (107 kg)   Goal Weight: Short Term 230 lb (104.3 kg)   Goal Weight: Long Term 180 lb (81.6 kg)   Expected Outcomes Short Term: Continue to assess and modify interventions until short term weight is achieved   Sedentary Yes   Intervention Provide advice, education, support and counseling about physical activity/exercise needs.;Develop an individualized exercise prescription for aerobic and resistive training based on initial evaluation findings, risk stratification, comorbidities and participant's personal goals.   Expected Outcomes Achievement of increased cardiorespiratory fitness and enhanced flexibility, muscular endurance and strength shown through measurements of functional capacity and personal statement of participant.  Increase Strength and Stamina Yes   Intervention Provide advice, education, support and counseling about physical activity/exercise needs.;Develop an individualized exercise prescription for aerobic and resistive training based on initial evaluation findings, risk stratification, comorbidities and participant's personal goals.   Expected Outcomes Achievement of  increased cardiorespiratory fitness and enhanced flexibility, muscular endurance and strength shown through measurements of functional capacity and personal statement of participant.   Develop more efficient breathing techniques such as purse lipped breathing and diaphragmatic breathing; and practicing self-pacing with activity Yes   Intervention Provide education, demonstration and support about specific breathing techniuqes utilized for more efficient breathing. Include techniques such as pursed lipped breathing, diaphragmatic breathing and self-pacing activity.   Expected Outcomes Short Term: Participant will be able to demonstrate and use breathing techniques as needed throughout daily activities.   Increase knowledge of respiratory medications and ability to use respiratory devices properly  Yes   Intervention Provide education and demonstration as needed of appropriate use of medications, inhalers, and oxygen therapy.   Expected Outcomes Short Term: Achieves understanding of medications use. Understands that oxygen is a medication prescribed by physician. Demonstrates appropriate use of inhaler and oxygen therapy.   Diabetes Yes   Intervention Provide education about signs/symptoms and action to take for hypo/hyperglycemia.;Provide education about proper nutrition, including hydration, and aerobic/resistive exercise prescription along with prescribed medications to achieve blood glucose in normal ranges: Fasting glucose 65-99 mg/dL   Expected Outcomes Short Term: Participant verbalizes understanding of the signs/symptoms and immediate care of hyper/hypoglycemia, proper foot care and importance of medication, aerobic/resistive exercise and nutrition plan for blood glucose control.;Long Term: Attainment of HbA1C < 7%.   Heart Failure Yes   Intervention Provide a combined exercise and nutrition program that is supplemented with education, support and counseling about heart failure. Directed toward  relieving symptoms such as shortness of breath, decreased exercise tolerance, and extremity edema.   Expected Outcomes Improve functional capacity of life   Hypertension Yes   Intervention Provide education on lifestyle modifcations including regular physical activity/exercise, weight management, moderate sodium restriction and increased consumption of fresh fruit, vegetables, and low fat dairy, alcohol moderation, and smoking cessation.;Monitor prescription use compliance.   Expected Outcomes Short Term: Continued assessment and intervention until BP is < 140/73mm HG in hypertensive participants. < 130/52mm HG in hypertensive participants with diabetes, heart failure or chronic kidney disease.;Long Term: Maintenance of blood pressure at goal levels.   Lipids Yes   Intervention Provide education and support for participant on nutrition & aerobic/resistive exercise along with prescribed medications to achieve LDL 70mg , HDL >40mg .   Expected Outcomes Short Term: Participant states understanding of desired cholesterol values and is compliant with medications prescribed. Participant is following exercise prescription and nutrition guidelines.;Long Term: Cholesterol controlled with medications as prescribed, with individualized exercise RX and with personalized nutrition plan. Value goals: LDL < 70mg , HDL > 40 mg.      Tobacco Use Initial Evaluation: History  Smoking Status   Former Smoker   Packs/day: 1.00   Years: 3.00  Smokeless Tobacco   Never Used    Copy of goals given to participant.

## 2016-02-11 ENCOUNTER — Encounter: Payer: Medicare Other | Admitting: *Deleted

## 2016-02-11 DIAGNOSIS — I110 Hypertensive heart disease with heart failure: Secondary | ICD-10-CM | POA: Diagnosis not present

## 2016-02-11 DIAGNOSIS — I5032 Chronic diastolic (congestive) heart failure: Secondary | ICD-10-CM

## 2016-02-11 LAB — GLUCOSE, CAPILLARY
GLUCOSE-CAPILLARY: 124 mg/dL — AB (ref 65–99)
Glucose-Capillary: 209 mg/dL — ABNORMAL HIGH (ref 65–99)

## 2016-02-11 NOTE — Progress Notes (Signed)
Daily Session Note  Patient Details  Name: Priscilla Villanueva MRN: [id number] Date of Birth: Oct 29, 1950 Referring Provider:   Flowsheet Row Pulmonary Rehab from 02/08/2016 in [facility] Cardiac and Pulmonary Rehab  Referring Provider  Paraschos      Encounter Date: 02/11/2016  Check In:     Session Check In - 02/11/16 1155      Check-In   Location [facility]-Cardiac & Pulmonary Rehab   Staff Present Heath Lark, RN, BSN, CCRP;Carroll Enterkin, RN, Michaela Corner, RRT, RCP, Respiratory Therapist   Supervising physician immediately available to respond to emergencies [facility] immediately available ER MD   Physician(s) Drs. Quentin Cornwall and [place]   Medication changes reported     No   Fall or balance concerns reported    No   Warm-up and Cool-down Performed as group-led Location manager Performed Yes   VAD Patient? No     Pain Assessment   Currently in Pain? No/denies   Multiple Pain Sites No         Goals Met:  Proper associated with RPD/PD & O2 Sat Exercise tolerated well Personal goals reviewed Queuing for purse lip breathing Strength training completed today  Goals Unmet:  Not Applicable  Comments: First full day of exercise!  Patient was oriented to gym and equipment including functions, settings, policies, and procedures.  Patient's individual exercise prescription and treatment plan were reviewed.  All starting workloads were established based on the results of the 6 minute walk test done at initial orientation visit.  The plan for exercise progression was also introduced and progression will be customized based on patient's performance and goals.    Dr. Emily Filbert is Medical Director for [place] and [facility].

## 2016-02-14 ENCOUNTER — Encounter: Payer: Medicare Other | Admitting: *Deleted

## 2016-02-14 DIAGNOSIS — I5032 Chronic diastolic (congestive) heart failure: Secondary | ICD-10-CM

## 2016-02-14 DIAGNOSIS — I110 Hypertensive heart disease with heart failure: Secondary | ICD-10-CM | POA: Diagnosis not present

## 2016-02-14 LAB — GLUCOSE, CAPILLARY
Glucose-Capillary: 132 mg/dL — ABNORMAL HIGH (ref 65–99)
Glucose-Capillary: 140 mg/dL — ABNORMAL HIGH (ref 65–99)

## 2016-02-14 NOTE — Progress Notes (Signed)
Daily Session Note  Patient Details  Name: Priscilla Villanueva MRN: [id number] Date of Birth: 1950/12/13 Referring Provider:   Flowsheet Row Pulmonary Rehab from 02/08/2016 in [facility] Cardiac and Pulmonary Rehab  Referring Provider  Paraschos      Encounter Date: 02/14/2016  Check In:     Session Check In - 02/14/16 1340      Check-In   Location [facility]-Cardiac & Pulmonary Rehab   Staff Present Gerlene Burdock, RN, Moises Blood, BS, ACSM CEP, Exercise Physiologist;Amanda Oletta Darter, [place], ACSM CEP, Exercise Physiologist   Supervising physician immediately available to respond to emergencies [facility] immediately available ER MD   Physician(s) Alfred Levins and [place]   Medication changes reported     No   Fall or balance concerns reported    No   Warm-up and Cool-down Performed on first and last piece of equipment   Resistance Training Performed Yes   VAD Patient? No     Pain Assessment   Currently in Pain? No/denies   Multiple Pain Sites No         Goals Met:  Proper associated with RPD/PD & O2 Sat Independence with exercise equipment Exercise tolerated well Strength training completed today  Goals Unmet:  Not Applicable  Comments: Pt able to follow exercise prescription today without complaint.  Will continue to monitor for progression.    Dr. Emily Filbert is Medical Director for [place] and [facility].

## 2016-02-18 ENCOUNTER — Encounter: Payer: Medicare Other | Admitting: *Deleted

## 2016-02-18 DIAGNOSIS — I5032 Chronic diastolic (congestive) heart failure: Secondary | ICD-10-CM

## 2016-02-18 DIAGNOSIS — I110 Hypertensive heart disease with heart failure: Secondary | ICD-10-CM | POA: Diagnosis not present

## 2016-02-18 LAB — GLUCOSE, CAPILLARY
Glucose-Capillary: 267 mg/dL — ABNORMAL HIGH (ref 65–99)
Glucose-Capillary: 181 mg/dL — ABNORMAL HIGH (ref 65–99)

## 2016-02-18 NOTE — Progress Notes (Signed)
Daily Session Note  Patient Details  Name: Priscilla Villanueva MRN: [id number] Date of Birth: [birth year] Referring Provider:   Flowsheet Row Pulmonary Rehab from 02/08/2016 in [facility] : [facility] Cardiac and Pulmonary Rehab  Referring Provider  Paraschos      Encounter Date: 02/18/2016  Check In:     Session Check In - 02/18/16 1100      Check-In   Location [facility]-Cardiac & Pulmonary Rehab   Staff Present Heath Lark, RN, BSN, CCRP;Danie Hannig, RN, Michaela Corner, RRT, RCP, Respiratory Therapist   Supervising physician immediately available to respond to emergencies [facility] immediately available ER MD   Physician(s) Dr. Mariea Clonts and Dr. Clearnce Hasten   Medication changes reported     No   Fall or balance concerns reported    No   Warm-up and Cool-down Performed as group-led instruction   Resistance Training Performed Yes   VAD Patient? No     Pain Assessment   Currently in Pain? No/denies         Goals Met:  Proper associated with RPD/PD & O2 Sat Exercise tolerated well  Goals Unmet:  Not Applicable  Comments:     Dr. Emily Filbert is Medical Director for [place] and [facility].

## 2016-02-21 ENCOUNTER — Encounter: Payer: Medicare Other | Admitting: *Deleted

## 2016-02-21 DIAGNOSIS — I5032 Chronic diastolic (congestive) heart failure: Secondary | ICD-10-CM

## 2016-02-21 NOTE — Progress Notes (Signed)
Daily Session Note  Patient Details  Name: Priscilla Villanueva MRN: [id number] Date of Birth: Aug 21, 1950 Referring Provider:   Flowsheet Row Pulmonary Rehab from 02/08/2016 in [facility] Cardiac and Pulmonary Rehab  Referring Provider  Paraschos      Encounter Date: 02/21/2016  Check In:     Session Check In - 02/21/16 1411      Check-In   Location [facility]-Cardiac & Pulmonary Rehab   Staff Present Carson Myrtle, BS, RRT, Respiratory Therapist;Harrietta Incorvaia Amedeo Plenty, BS, ACSM CEP, Exercise Physiologist;Amanda Oletta Darter, BA, ACSM CEP, Exercise Physiologist   Supervising physician immediately available to respond to emergencies [facility] immediately available ER MD   Physician(s) Mariea Clonts and Clearnce Hasten   Medication changes reported     No   Fall or balance concerns reported    No   Warm-up and Cool-down Performed on first and last piece of equipment   Resistance Training Performed Yes   VAD Patient? No     Pain Assessment   Currently in Pain? No/denies   Multiple Pain Sites No         Goals Met:    Goals Unmet:    Comments: Patient arrived with elevated blood pressure of 190/110 and stated she was feeling palpitations. She rested and then blood pressure was taken again which was 200/98 and she stated the palpitations were gone. Patient participated in resistive and cardio warm up exercises and BP was taken again and read 190/110. Patient did not want to be seen by an ER doctor. She was sent home and advised to rest and call her doctor.   Dr. Emily Filbert is Medical Director for [place] and [facility].

## 2016-02-23 DIAGNOSIS — I5032 Chronic diastolic (congestive) heart failure: Secondary | ICD-10-CM

## 2016-02-23 DIAGNOSIS — I110 Hypertensive heart disease with heart failure: Secondary | ICD-10-CM | POA: Diagnosis not present

## 2016-02-23 NOTE — Progress Notes (Signed)
Daily Session Note  Patient Details  Name: Priscilla Villanueva MRN: [id number] Date of Birth: 1950/08/04 Referring Provider:   Flowsheet Row Pulmonary Rehab from 02/08/2016 in [facility] Cardiac and Pulmonary Rehab  Referring Provider  Paraschos      Encounter Date: 02/23/2016  Check In:     Session Check In - 02/23/16 1210      Check-In   Location [facility]-Cardiac & Pulmonary Rehab   Staff Present Heath Lark, RN, BSN, CCRP;Laureen Owens Shark, BS, RRT, Respiratory Dareen Piano, BA, ACSM CEP, Exercise Physiologist   Supervising physician immediately available to respond to emergencies [facility] immediately available ER MD   Physician(s) Alfred Levins and Joni Fears   Medication changes reported     No   Fall or balance concerns reported    No   Warm-up and Cool-down Performed as group-led Location manager Performed Yes   VAD Patient? No     Pain Assessment   Currently in Pain? No/denies   Multiple Pain Sites No         Goals Met:  Proper associated with RPD/PD & O2 Sat Independence with exercise equipment Exercise tolerated well Strength training completed today  Goals Unmet:  Not Applicable  Comments: Pt able to follow exercise prescription today without complaint.  Will continue to monitor for progression.    Dr. Emily Filbert is Medical Director for [place] and [facility].

## 2016-02-28 ENCOUNTER — Encounter: Payer: Self-pay | Admitting: Respiratory Therapy

## 2016-02-28 ENCOUNTER — Encounter: Payer: Medicare Other | Attending: Cardiology

## 2016-02-28 DIAGNOSIS — F419 Anxiety disorder, unspecified: Secondary | ICD-10-CM | POA: Insufficient documentation

## 2016-02-28 DIAGNOSIS — E119 Type 2 diabetes mellitus without complications: Secondary | ICD-10-CM | POA: Insufficient documentation

## 2016-02-28 DIAGNOSIS — Z87891 Personal history of nicotine dependence: Secondary | ICD-10-CM | POA: Insufficient documentation

## 2016-02-28 DIAGNOSIS — I5032 Chronic diastolic (congestive) heart failure: Secondary | ICD-10-CM

## 2016-02-28 DIAGNOSIS — I110 Hypertensive heart disease with heart failure: Secondary | ICD-10-CM | POA: Insufficient documentation

## 2016-02-28 DIAGNOSIS — E7800 Pure hypercholesterolemia, unspecified: Secondary | ICD-10-CM | POA: Insufficient documentation

## 2016-02-28 DIAGNOSIS — Z85038 Personal history of other malignant neoplasm of large intestine: Secondary | ICD-10-CM | POA: Insufficient documentation

## 2016-02-28 DIAGNOSIS — F329 Major depressive disorder, single episode, unspecified: Secondary | ICD-10-CM | POA: Insufficient documentation

## 2016-02-28 DIAGNOSIS — G4730 Sleep apnea, unspecified: Secondary | ICD-10-CM | POA: Insufficient documentation

## 2016-02-28 NOTE — Progress Notes (Signed)
Pulmonary Individual Treatment Plan  Patient Details  Name: QUETZALI HEINLE MRN: [id number] Date of Birth: 12/18/1950 Referring Provider:   Flowsheet Row Pulmonary Rehab from 02/08/2016 in [facility] Cardiac and Pulmonary Rehab  Referring Provider  Paraschos      Initial Encounter Date:  Flowsheet Row Pulmonary Rehab from 02/08/2016 in [facility] Cardiac and Pulmonary Rehab  Date  02/08/16  Referring Provider  Paraschos      Visit Diagnosis: Heart failure, diastolic, chronic ([place])  Patient's Home Medications on Admission:  Current Outpatient Prescriptions:    allopurinol (ZYLOPRIM) 100 MG tablet, Take 100 mg by mouth 2 (two) times daily. , Disp: , Rfl:    ALPRAZolam (XANAX) 0.25 MG tablet, Take 0.25 mg by mouth at bedtime as needed for anxiety., Disp: , Rfl:    aspirin 81 MG EC tablet, Take 81 mg by mouth daily. Swallow whole., Disp: , Rfl:    b complex vitamins capsule, Take 1 capsule by mouth daily., Disp: , Rfl:    cloNIDine (CATAPRES) 0.3 MG tablet, 0.3 mg 3 (three) times daily. , Disp: , Rfl:    colchicine 0.6 MG tablet, Take 2 tablets by mouth as needed., Disp: , Rfl:    ferrous sulfate 325 (65 FE) MG EC tablet, Take 325 mg by mouth every morning. Reported on 06/11/2015, Disp: , Rfl:    furosemide (LASIX) 40 MG tablet, Take 40 mg by mouth 2 (two) times daily., Disp: , Rfl:    HUMALOG MIX 75/25 KWIKPEN (75-25) 100 UNIT/ML Kwikpen, , Disp: , Rfl:    hydrALAZINE (APRESOLINE) 50 MG tablet, Take 100 mg by mouth 3 (three) times daily. , Disp: , Rfl:    insulin lispro (HUMALOG) 100 UNIT/ML injection, Inject 32 Units into the skin 2 (two) times daily before a meal. Reported on 06/11/2015, Disp: , Rfl:    Magnesium 250 MG TABS, Take 2 tablets by mouth 2 (two) times daily., Disp: , Rfl:    metFORMIN (GLUCOPHAGE) 1000 MG tablet, Take 1,000 mg by mouth 2 (two) times daily with a meal., Disp: , Rfl:    metoprolol (TOPROL-XL) 200 MG 24 hr tablet, Take 200 mg by mouth daily., Disp: , Rfl:     olmesartan (BENICAR) 40 MG tablet, Take 40 mg by mouth daily., Disp: , Rfl:    potassium chloride (K-DUR) 10 MEQ tablet, TAKE ONE TABLET BY MOUTH TWICE DAILY, Disp: 60 tablet, Rfl: 0   pravastatin (PRAVACHOL) 20 MG tablet, Take 20 mg by mouth daily., Disp: , Rfl:    sertraline (ZOLOFT) 50 MG tablet, Take 50 mg by mouth daily., Disp: , Rfl:    torsemide (DEMADEX) 20 MG tablet, Take 20 mg by mouth daily., Disp: , Rfl:   Past Medical History: Past Medical History:  Diagnosis Date   CHF (congestive heart failure) ([place])    Colon cancer ([place]) 2009   with chemotherapy   Colon cancer ([place]) 11/26/2014   Diabetes mellitus without complication ([place])    Hypercholesteremia    Hypertension     Tobacco Use: History  Smoking Status   Former Smoker   Packs/day: 1.00   Years: 3.00  Smokeless Tobacco   Never Used    Labs: Recent Chemical engineer    Labs for ITP Cardiac and Pulmonary Rehab Latest Ref Rng & Units 02/06/2013 05/27/2013   Cholestrol 0 - 200 mg/dL 178 -   LDLCALC 0 - 100 mg/dL 91 -   HDL 40 - 60 mg/dL 47 -   Trlycerides 0 - 200 mg/dL  200 -   Hemoglobin A1c 4.2 - 6.3 % - 7.4(H)       ADL UCSD:     Pulmonary Assessment Scores    Row Name 02/08/16 1221         ADL UCSD   SOB Score total 20     Rest 0     Walk 1     Stairs 3     Bath 0     Dress 1     Shop 1        Pulmonary Function Assessment:     Pulmonary Function Assessment - 02/08/16 1218      Initial Spirometry Results   FVC% 78 %   FEV1% 92 %   FEV1/FVC Ratio 93.24     Breath   Shortness of Breath Fear of Shortness of Breath;Limiting activity  Fear 2/5 rated      Exercise Target Goals:    Exercise Program Goal: Individual exercise prescription set with THRR, safety & activity barriers. Participant demonstrates ability to understand and report RPE using BORG scale, to self-measure pulse accurately, and to acknowledge the importance of the exercise prescription.  Exercise  Prescription Goal: Starting with aerobic activity 30 plus minutes a day, 3 days per week for initial exercise prescription. Provide home exercise prescription and guidelines that participant acknowledges understanding prior to discharge.  Activity Barriers & Risk Stratification:     Activity Barriers & Cardiac Risk Stratification - 02/08/16 1217      Activity Barriers & Cardiac Risk Stratification   Activity Barriers Arthritis;Shortness of Breath;Deconditioning      6 Minute Walk:     6 Minute Walk    Row Name 02/08/16 1319         6 Minute Walk   Distance 1088 feet     Walk Time 6 minutes     MPH 2.06     METS 2.48     RPE 13     Perceived Dyspnea  3     VO2 Peak 8.7        Initial Exercise Prescription:     Initial Exercise Prescription - 02/08/16 1300      Date of Initial Exercise RX and Referring Provider   Date 02/08/16   Referring Provider Paraschos     Treadmill   MPH 1.9   Grade 0   Minutes 15   METs 2.45     NuStep   Level 2   Minutes 15   METs 2.5     REL-XR   Level 1   Minutes 15   METs 2.5     Prescription Details   Frequency (times per week) 3   Duration Progress to 45 minutes of aerobic exercise without signs/symptoms of physical distress     Intensity   THRR 40-80% of Max Heartrate 110-141   Ratings of Perceived Exertion 11-13   Perceived Dyspnea 0-4     Progression   Progression Continue to progress workloads to maintain intensity without signs/symptoms of physical distress.     Resistance Training   Training Prescription Yes   Weight 2   Reps 10-15      Perform Capillary Blood Glucose checks as needed.  Exercise Prescription Changes:     Exercise Prescription Changes    Row Name 02/11/16 1100 02/23/16 1200           Exercise Review   Progression --  First Full Day of Exercise Yes        Response  to Exercise   Blood Pressure (Admit) 170/102 168/90      Blood Pressure (Exercise) 144/90 182/80      Blood  Pressure (Exit) 106/64 152/90      Heart Rate (Admit) 81 bpm 76 bpm      Heart Rate (Exercise) 101 bpm 100 bpm      Heart Rate (Exit) 71 bpm 71 bpm      Oxygen Saturation (Admit) 97 % 96 %      Oxygen Saturation (Exercise) 96 % 95 %      Oxygen Saturation (Exit) 98 % 97 %      Rating of Perceived Exertion (Exercise) 13 15      Perceived Dyspnea (Exercise) 2 3      Symptoms none none      Duration Progress to 45 minutes of aerobic exercise without signs/symptoms of physical distress Progress to 45 minutes of aerobic exercise without signs/symptoms of physical distress      Intensity THRR unchanged THRR unchanged        Progression   Progression Continue to progress workloads to maintain intensity without signs/symptoms of physical distress. Continue to progress workloads to maintain intensity without signs/symptoms of physical distress.      Average METs 1.4 1.9        Resistance Training   Training Prescription Yes Yes      Weight 2 lbs 2      Reps 10-15 10-15        Interval Training   Interval Training No No        Treadmill   MPH 1.5 1.2      Grade 0 0      Minutes 15 15      METs  -- 1.9        NuStep   Level 3 3      Minutes 15 15      METs 1.4  --        REL-XR   Level  -- 4      Minutes  -- 15         Exercise Comments:     Exercise Comments    Row Name 02/11/16 1158 02/18/16 1101         Exercise Comments First full day of exercise!  Patient was oriented to gym and equipment including functions, settings, policies, and procedures.  Patient's individual exercise prescription and treatment plan were reviewed.  All starting workloads were established based on the results of the 6 minute walk test done at initial orientation visit.  The plan for exercise progression was also introduced and progression will be customized based on patient's performance and goals. Gout is limiting [place] today with her exercise.          Discharge Exercise Prescription (Final  Exercise Prescription Changes):     Exercise Prescription Changes - 02/23/16 1200      Exercise Review   Progression Yes     Response to Exercise   Blood Pressure (Admit) 168/90   Blood Pressure (Exercise) 182/80   Blood Pressure (Exit) 152/90   Heart Rate (Admit) 76 bpm   Heart Rate (Exercise) 100 bpm   Heart Rate (Exit) 71 bpm   Oxygen Saturation (Admit) 96 %   Oxygen Saturation (Exercise) 95 %   Oxygen Saturation (Exit) 97 %   Rating of Perceived Exertion (Exercise) 15   Perceived Dyspnea (Exercise) 3   Symptoms none   Duration Progress to 45 minutes of aerobic exercise  without signs/symptoms of physical distress   Intensity THRR unchanged     Progression   Progression Continue to progress workloads to maintain intensity without signs/symptoms of physical distress.   Average METs 1.9     Resistance Training   Training Prescription Yes   Weight 2   Reps 10-15     Interval Training   Interval Training No     Treadmill   MPH 1.2   Grade 0   Minutes 15   METs 1.9     NuStep   Level 3   Minutes 15     REL-XR   Level 4   Minutes 15       Nutrition:  Target Goals: Understanding of nutrition guidelines, daily intake of sodium <151m, cholesterol <2043m calories 30% from fat and 7% or less from saturated fats, daily to have 5 or more servings of fruits and vegetables.  Biometrics:     Pre Biometrics - 02/08/16 1319      Pre Biometrics   Height 5' 4.75" (1.645 m)   Weight 242 lb (109.8 kg)   Waist Circumference 45.75 inches   Hip Circumference 52.5 inches   Waist to Hip Ratio 0.87 %   BMI (Calculated) 40.7       Nutrition Therapy Plan and Nutrition Goals:     Nutrition Therapy & Goals - 02/17/16 1455      Nutrition Therapy   Diet Instructed on a meal plan based on 1600 calories including dietary guidelines for diabetes and congestive heart failure   Protein (specify units) 6   Fiber 20 grams   Whole Grain Foods 3 servings   Saturated Fats  11 max. grams   Fruits and Vegetables 5 servings/day   Sodium 1500 grams     Personal Nutrition Goals   Personal Goal #1 Rather than skipping breakfast, drink Boost glucose control or Glucerna or can make Smoothie with almond milk, 1 serving of fruit, peanut butter and kale.   Personal Goal #2 Try Ms. DASH seasonings such as lemon pepper for fish or Ms. DASH pkg.of taco seasoning. Also, try no sodium Tostado taco bowls.   Personal Goal #3 If choose to add small amount of salt, try Morton's lite salt.   Personal Goal #4 Balance meals with protein, 1-2 starch servings and non-starchy vegetables.   Additional Goals? Yes   Personal Goal #5 Use calorieking website or phone app to look up nutrition information for meals eaten "out".     Intervention Plan   Intervention Prescribe, educate and counsel regarding individualized specific dietary modifications aiming towards targeted core components such as weight, hypertension, lipid management, diabetes, heart failure and other comorbidities.;Nutrition handout(s) given to patient.   Expected Outcomes Short Term Goal: Understand basic principles of dietary content, such as calories, fat, sodium, cholesterol and nutrients.;Short Term Goal: A plan has been developed with personal nutrition goals set during dietitian appointment.;Long Term Goal: Adherence to prescribed nutrition plan.      Nutrition Discharge: Rate Your Plate Scores:     Nutrition Assessments - 02/17/16 1500      Rate Your Plate Scores   Pre Score 56   Pre Score % 62 %      Psychosocial: Target Goals: Acknowledge presence or absence of depression, maximize coping skills, provide positive support system. Participant is able to verbalize types and ability to use techniques and skills needed for reducing stress and depression.  Initial Review & Psychosocial Screening:     Initial Psych Review &  Screening - 02/08/16 1218      Initial Review   Current issues with Current  Psychotropic Meds;History of Depression  Zoloft prescribed, not taking because of side effects      Quality of Life Scores:     Quality of Life - 02/08/16 1215      Quality of Life Scores   Health/Function Pre 20.44 %   Socioeconomic Pre 19.43 %   Psych/Spiritual Pre 20.29 %   Family Pre 20.2 %   GLOBAL Pre 20.17 %      PHQ-9: Recent Review Flowsheet Data    Depression screen [facility] 2/9 02/08/2016   Decreased Interest 1   Down, Depressed, Hopeless 1   PHQ - 2 Score 2   Altered sleeping 3    Tired, decreased energy 2   Change in appetite 2   Feeling bad or failure about yourself  0   Trouble concentrating 1   Moving slowly or fidgety/restless 0   Suicidal thoughts 0   PHQ-9 Score 10   Difficult doing work/chores Somewhat difficult      Psychosocial Evaluation and Intervention:     Psychosocial Evaluation - 02/14/16 1630      Psychosocial Evaluation & Interventions   Interventions Encouraged to exercise with the program and follow exercise prescription;Relaxation education   Comments Counselor met with Ms. Mccaig today for initial psychosocial evaluation.  She is a 65 year old who has multiple health issues; including Congestive Heart Failure and diabetes.  She is also a survivor of Colon Cancer in 2009 and has been cancer free for 7 years now.  Ms. Niblack has a significant other in her support system and she lives in a Senior community.  She reports sleeping "okay" (intermittently) and her Dr. gave her Xanax to help with this.  She also reports that her blood pressure is so high that she is on high doses of blood pressure meds which make her sleepy.  But none of these medications are keeping her asleep all night.  Her appetite is up and down and the mornings are difficult to make herself eat anything.  She admits to a history of depression and anxiety and has been prescribed medications for this; although she is not currently taking due to negative side effects.  She plans to  speak with her Dr. about this on Wednesday.  Ms. Shreeve states her mood is generally positive; although she has multiple stressors in her life; including her health, her grandson who lived with her all summer and needs support getting through high school and she has been estranged from her family for quite some time for "religious differences."  Ms. Picone has been through a great deal in her life with a son passing away (3) years ago with heart disease at the age of 63 years old.  She reports this being sad but she relies on her faith to cope with all of these losses.  Ms. Steier has goals to decrease her blood pressure and stabilize her sugars as well as lose some weight while in this program. Counselor will follow with Ms. Femia throughout the course of this program.     Continued Psychosocial Services Needed Yes  Follow with her about the SSRI and negative side effects as well as her interrupted sleep.       Psychosocial Re-Evaluation:  Education: Education Goals: Education classes will be provided on a weekly basis, covering required topics. Participant will state understanding/return demonstration of topics presented.  Learning Barriers/Preferences:  Learning Barriers/Preferences - 02/08/16 1217      Learning Barriers/Preferences   Learning Barriers None   Learning Preferences None      Education Topics: Initial Evaluation Education: - Verbal, written and demonstration of respiratory meds, RPE/PD scales, oximetry and breathing techniques. Instruction on use of nebulizers and MDIs: cleaning and proper use, rinsing mouth with steroid doses and importance of monitoring MDI activations.   General Nutrition Guidelines/Fats and Fiber: -Group instruction provided by verbal, written material, models and posters to present the general guidelines for heart healthy nutrition. Gives an explanation and review of dietary fats and fiber.   Controlling Sodium/Reading Food Labels: -Group  verbal and written material supporting the discussion of sodium use in heart healthy nutrition. Review and explanation with models, verbal and written materials for utilization of the food label.   Exercise Physiology & Risk Factors: - Group verbal and written instruction with models to review the exercise physiology of the cardiovascular system and associated critical values. Details cardiovascular disease risk factors and the goals associated with each risk factor.   Aerobic Exercise & Resistance Training: - Gives group verbal and written discussion on the health impact of inactivity. On the components of aerobic and resistive training programs and the benefits of this training and how to safely progress through these programs.   Flexibility, Balance, General Exercise Guidelines: - Provides group verbal and written instruction on the benefits of flexibility and balance training programs. Provides general exercise guidelines with specific guidelines to those with heart or lung disease. Demonstration and skill practice provided.   Stress Management: - Provides group verbal and written instruction about the health risks of elevated stress, cause of high stress, and healthy ways to reduce stress.   Depression: - Provides group verbal and written instruction on the correlation between heart/lung disease and depressed mood, treatment options, and the stigmas associated with seeking treatment.   Exercise & Equipment Safety: - Individual verbal instruction and demonstration of equipment use and safety with use of the equipment. Flowsheet Row Pulmonary Rehab from 02/11/2016 in [facility] Cardiac and Pulmonary Rehab  Date  02/08/16  Educator  sB  Instruction Review Code  2- meets goals/outcomes      Infection Prevention: - Provides verbal and written material to individual with discussion of infection control including proper hand washing and proper equipment cleaning during exercise  session. Flowsheet Row Pulmonary Rehab from 02/11/2016 in [facility] Cardiac and Pulmonary Rehab  Date  02/08/16  Educator  SB  Instruction Review Code  2- meets goals/outcomes      Falls Prevention: - Provides verbal and written material to individual with discussion of falls prevention and safety. Flowsheet Row Pulmonary Rehab from 02/11/2016 in [facility] Cardiac and Pulmonary Rehab  Date  02/08/16  Educator  SB  Instruction Review Code  2- meets goals/outcomes      Diabetes: - Individual verbal and written instruction to review signs/symptoms of diabetes, desired ranges of glucose level fasting, after meals and with exercise. Advice that pre and post exercise glucose checks will be done for 3 sessions at entry of program. Flowsheet Row Pulmonary Rehab from 02/11/2016 in [facility] Cardiac and Pulmonary Rehab  Date  02/08/16  Educator  SB  Instruction Review Code  2- meets goals/outcomes      Chronic Lung Diseases: - Group verbal and written instruction to review new updates, new respiratory medications, new advancements in procedures and treatments. Provide informative websites and "800" numbers of self-education.   Lung Procedures: - Group verbal and  written instruction to describe testing methods done to diagnose lung disease. Review the outcome of test results. Describe the treatment choices: Pulmonary Function Tests, ABGs and oximetry.   Energy Conservation: - Provide group verbal and written instruction for methods to conserve energy, plan and organize activities. Instruct on pacing techniques, use of adaptive equipment and posture/positioning to relieve shortness of breath.   Triggers: - Group verbal and written instruction to review types of environmental controls: home humidity, furnaces, filters, dust mite/pet prevention, HEPA vacuums. To discuss weather changes, air quality and the benefits of nasal washing.   Exacerbations: - Group verbal and written instruction to provide:  warning signs, infection symptoms, calling MD promptly, preventive modes, and value of vaccinations. Review: effective airway clearance, coughing and/or vibration techniques. Create an Sports administrator.   Oxygen: - Individual and group verbal and written instruction on oxygen therapy. Includes supplement oxygen, available portable oxygen systems, continuous and intermittent flow rates, oxygen safety, concentrators, and Medicare reimbursement for oxygen.   Respiratory Medications: - Group verbal and written instruction to review medications for lung disease. Drug class, frequency, complications, importance of spacers, rinsing mouth after steroid MDI's, and proper cleaning methods for nebulizers.   AED/CPR: - Group verbal and written instruction with the use of models to demonstrate the basic use of the AED with the basic ABC's of resuscitation.   Breathing Retraining: - Provides individuals verbal and written instruction on purpose, frequency, and proper technique of diaphragmatic breathing and pursed-lipped breathing. Applies individual practice skills. Flowsheet Row Pulmonary Rehab from 02/11/2016 in [facility] Cardiac and Pulmonary Rehab  Date  02/11/16  Educator  [place]  Instruction Review Code  2- meets goals/outcomes      Anatomy and Physiology of the Lungs: - Group verbal and written instruction with the use of models to provide basic lung anatomy and physiology related to function, structure and complications of lung disease.   Heart Failure: - Group verbal and written instruction on the basics of heart failure: signs/symptoms, treatments, explanation of ejection fraction, enlarged heart and cardiomyopathy.   Sleep Apnea: - Individual verbal and written instruction to review Obstructive Sleep Apnea. Review of risk factors, methods for diagnosing and types of masks and machines for OSA.   Anxiety: - Provides group, verbal and written instruction on the correlation between heart/lung  disease and anxiety, treatment options, and management of anxiety.   Relaxation: - Provides group, verbal and written instruction about the benefits of relaxation for patients with heart/lung disease. Also provides patients with examples of relaxation techniques.   Knowledge Questionnaire Score:     Knowledge Questionnaire Score - 02/08/16 1218      Knowledge Questionnaire Score   Pre Score 5/10       Core Components/Risk Factors/Patient Goals at Admission:     Personal Goals and Risk Factors at Admission - 02/08/16 1210      Core Components/Risk Factors/Patient Goals on Admission    Weight Management Obesity;Weight Maintenance;Yes   Intervention Weight Management: Develop a combined nutrition and exercise program designed to reach desired caloric intake, while maintaining appropriate intake of nutrient and fiber, sodium and fats, and appropriate energy expenditure required for the weight goal.;Weight Management: Provide education and appropriate resources to help participant work on and attain dietary goals.;Obesity: Provide education and appropriate resources to help participant work on and attain dietary goals.   Admit Weight 236 lb (107 kg)   Goal Weight: Short Term 230 lb (104.3 kg)   Goal Weight: Long Term 180 lb (81.6 kg)  Expected Outcomes Short Term: Continue to assess and modify interventions until short term weight is achieved   Sedentary Yes   Intervention Provide advice, education, support and counseling about physical activity/exercise needs.;Develop an individualized exercise prescription for aerobic and resistive training based on initial evaluation findings, risk stratification, comorbidities and participant's personal goals.   Expected Outcomes Achievement of increased cardiorespiratory fitness and enhanced flexibility, muscular endurance and strength shown through measurements of functional capacity and personal statement of participant.   Increase Strength and  Stamina Yes   Intervention Provide advice, education, support and counseling about physical activity/exercise needs.;Develop an individualized exercise prescription for aerobic and resistive training based on initial evaluation findings, risk stratification, comorbidities and participant's personal goals.   Expected Outcomes Achievement of increased cardiorespiratory fitness and enhanced flexibility, muscular endurance and strength shown through measurements of functional capacity and personal statement of participant.   Develop more efficient breathing techniques such as purse lipped breathing and diaphragmatic breathing; and practicing self-pacing with activity Yes   Intervention Provide education, demonstration and support about specific breathing techniuqes utilized for more efficient breathing. Include techniques such as pursed lipped breathing, diaphragmatic breathing and self-pacing activity.   Expected Outcomes Short Term: Participant will be able to demonstrate and use breathing techniques as needed throughout daily activities.   Increase knowledge of respiratory medications and ability to use respiratory devices properly  Yes   Intervention Provide education and demonstration as needed of appropriate use of medications, inhalers, and oxygen therapy.   Expected Outcomes Short Term: Achieves understanding of medications use. Understands that oxygen is a medication prescribed by physician. Demonstrates appropriate use of inhaler and oxygen therapy.   Diabetes Yes   Intervention Provide education about signs/symptoms and action to take for hypo/hyperglycemia.;Provide education about proper nutrition, including hydration, and aerobic/resistive exercise prescription along with prescribed medications to achieve blood glucose in normal ranges: Fasting glucose 65-99 mg/dL   Expected Outcomes Short Term: Participant verbalizes understanding of the signs/symptoms and immediate care of hyper/hypoglycemia,  proper foot care and importance of medication, aerobic/resistive exercise and nutrition plan for blood glucose control.;Long Term: Attainment of HbA1C < 7%.   Heart Failure Yes   Intervention Provide a combined exercise and nutrition program that is supplemented with education, support and counseling about heart failure. Directed toward relieving symptoms such as shortness of breath, decreased exercise tolerance, and extremity edema.   Expected Outcomes Improve functional capacity of life   Hypertension Yes   Intervention Provide education on lifestyle modifcations including regular physical activity/exercise, weight management, moderate sodium restriction and increased consumption of fresh fruit, vegetables, and low fat dairy, alcohol moderation, and smoking cessation.;Monitor prescription use compliance.   Expected Outcomes Short Term: Continued assessment and intervention until BP is < 140/28m HG in hypertensive participants. < 130/861mHG in hypertensive participants with diabetes, heart failure or chronic kidney disease.;Long Term: Maintenance of blood pressure at goal levels.   Lipids Yes   Intervention Provide education and support for participant on nutrition & aerobic/resistive exercise along with prescribed medications to achieve LDL <7083mHDL >20m60m Expected Outcomes Short Term: Participant states understanding of desired cholesterol values and is compliant with medications prescribed. Participant is following exercise prescription and nutrition guidelines.;Long Term: Cholesterol controlled with medications as prescribed, with individualized exercise RX and with personalized nutrition plan. Value goals: LDL < 70mg28mL > 40 mg.      Core Components/Risk Factors/Patient Goals Review:      Goals and Risk Factor Review    Row  Name 02/11/16 1157 02/23/16 1214           Core Components/Risk Factors/Patient Goals Review   Personal Goals Review Improve shortness of breath with  ADL's;Develop more efficient breathing techniques such as purse lipped breathing and diaphragmatic breathing and practicing self-pacing with activity. Diabetes;Hypertension;Weight Management/Obesity      Review Reviewed pursed lip breathing technique with pt to improve SOB Debras P is still high.  She takes an extra pill as instructed by her MD when needed.  She has met with the RD and understands more about sugar and sodium and correct amounts to eat.  Her BG has been about the same, around 160.      Expected Outcomes Pt will become independent in using pursed lip breathing for exercise and ADLs Claudene will continue to implement  healthy changes with her diet and exercise to improve her BP and BG numbers.         Core Components/Risk Factors/Patient Goals at Discharge (Final Review):      Goals and Risk Factor Review - 02/23/16 1214      Core Components/Risk Factors/Patient Goals Review   Personal Goals Review Diabetes;Hypertension;Weight Management/Obesity   Review Debras P is still high.  She takes an extra pill as instructed by her MD when needed.  She has met with the RD and understands more about sugar and sodium and correct amounts to eat.  Her BG has been about the same, around 160.   Expected Outcomes Azuri will continue to implement  healthy changes with her diet and exercise to improve her BP and BG numbers.      ITP Comments:     ITP Comments    Row Name 02/08/16 1203 02/18/16 1100 02/21/16 1411       ITP Comments ITP created during medical review . Diagnosis documentation found in [place] [facility] Visit 11/01/2015 CARE EVERYWHERE Gout is giving her problems today but Margarette said her gout medicine kicked in while she was on the treadmill.  Patient arrived with elevated blood pressure of 190/110 and stated she was feeling palpitations. She rested and then blood pressure was taken again which was 200/98 and she stated the palpitations were gone. Patient participated in resistive and cardio  warm up exercises and BP was taken again and read 190/110. Patient did not want to be seen by an ER doctor. She was sent home and advised to rest and call her doctor.         Comments: 30 day note review

## 2016-04-05 ENCOUNTER — Ambulatory Visit: Payer: Medicare Other | Attending: Internal Medicine

## 2016-04-05 DIAGNOSIS — G4733 Obstructive sleep apnea (adult) (pediatric): Secondary | ICD-10-CM | POA: Diagnosis not present

## 2016-09-06 ENCOUNTER — Other Ambulatory Visit: Payer: Self-pay | Admitting: Internal Medicine

## 2016-09-06 DIAGNOSIS — Z1231 Encounter for screening mammogram for malignant neoplasm of breast: Secondary | ICD-10-CM

## 2016-10-12 ENCOUNTER — Ambulatory Visit
Admission: RE | Admit: 2016-10-12 | Discharge: 2016-10-12 | Disposition: A | Discharge status: Home / Self Care | Payer: Medicare Other | Source: Ambulatory Visit | Attending: Internal Medicine | Admitting: Internal Medicine

## 2016-10-12 DIAGNOSIS — Z1231 Encounter for screening mammogram for malignant neoplasm of breast: Secondary | ICD-10-CM

## 2016-10-12 HISTORY — DX: Personal history of antineoplastic chemotherapy: Z92.21

## 2017-06-06 ENCOUNTER — Encounter: Payer: Self-pay | Admitting: Emergency Medicine

## 2017-06-06 ENCOUNTER — Emergency Department
Admission: EM | Admit: 2017-06-06 | Discharge: 2017-06-06 | Disposition: A | Discharge status: Home / Self Care | Payer: Medicare Other | Attending: Emergency Medicine | Admitting: Emergency Medicine

## 2017-06-06 ENCOUNTER — Other Ambulatory Visit: Payer: Self-pay

## 2017-06-06 ENCOUNTER — Emergency Department: Payer: Medicare Other

## 2017-06-06 DIAGNOSIS — S39012A Strain of muscle, fascia and tendon of lower back, initial encounter: Secondary | ICD-10-CM | POA: Diagnosis not present

## 2017-06-06 DIAGNOSIS — Z794 Long term (current) use of insulin: Secondary | ICD-10-CM | POA: Diagnosis not present

## 2017-06-06 DIAGNOSIS — I509 Heart failure, unspecified: Secondary | ICD-10-CM | POA: Insufficient documentation

## 2017-06-06 DIAGNOSIS — M47816 Spondylosis without myelopathy or radiculopathy, lumbar region: Secondary | ICD-10-CM | POA: Insufficient documentation

## 2017-06-06 DIAGNOSIS — Z87891 Personal history of nicotine dependence: Secondary | ICD-10-CM | POA: Insufficient documentation

## 2017-06-06 DIAGNOSIS — Z7982 Long term (current) use of aspirin: Secondary | ICD-10-CM | POA: Diagnosis not present

## 2017-06-06 DIAGNOSIS — Y92009 Unspecified place in unspecified non-institutional (private) residence as the place of occurrence of the external cause: Secondary | ICD-10-CM | POA: Insufficient documentation

## 2017-06-06 DIAGNOSIS — Y998 Other external cause status: Secondary | ICD-10-CM | POA: Diagnosis not present

## 2017-06-06 DIAGNOSIS — X58XXXA Exposure to other specified factors, initial encounter: Secondary | ICD-10-CM | POA: Insufficient documentation

## 2017-06-06 DIAGNOSIS — Z85038 Personal history of other malignant neoplasm of large intestine: Secondary | ICD-10-CM | POA: Diagnosis not present

## 2017-06-06 DIAGNOSIS — Z79899 Other long term (current) drug therapy: Secondary | ICD-10-CM | POA: Insufficient documentation

## 2017-06-06 DIAGNOSIS — E119 Type 2 diabetes mellitus without complications: Secondary | ICD-10-CM | POA: Diagnosis not present

## 2017-06-06 DIAGNOSIS — Y9389 Activity, other specified: Secondary | ICD-10-CM | POA: Diagnosis not present

## 2017-06-06 DIAGNOSIS — I110 Hypertensive heart disease with heart failure: Secondary | ICD-10-CM | POA: Insufficient documentation

## 2017-06-06 DIAGNOSIS — S3992XA Unspecified injury of lower back, initial encounter: Secondary | ICD-10-CM | POA: Diagnosis present

## 2017-06-06 MED ORDER — CYCLOBENZAPRINE HCL 10 MG PO TABS: 10.0000 mg | ORAL_TABLET | Freq: Three times a day (TID) | ORAL | 0 refills | Status: DC | PRN

## 2017-06-06 MED ORDER — TRAMADOL HCL 50 MG PO TABS: 50.0000 mg | ORAL_TABLET | Freq: Four times a day (QID) | ORAL | 0 refills | Status: AC | PRN

## 2017-06-06 NOTE — ED Triage Notes (Signed)
Pt to ed with c/o lower back pain for several days.  Pt states she was sweeping floor last night and pain became worse.

## 2017-06-06 NOTE — ED Provider Notes (Signed)
[facility] Emergency Department Provider Note   ____________________________________________   First MD Initiated Contact with Patient 06/06/17 1102     (approximate)  I have reviewed the triage vital signs and the nursing notes.   HISTORY  Chief Complaint Back Pain    HPI Priscilla Villanueva is a 67 y.o. female patient complaining increasing back pain for several days. Patient stated pain increases last night which she was sweeping the floor. Patient denies radicular component to her back pain. Patient has bladder bowel dysfunction. No palliative measures for complaint. Patient rates pain as10 over 10. Patient described a pain as "achy".   Past Medical History:  Diagnosis Date  . CHF (congestive heart failure) ([place])   . Colon cancer ([place]) 2009   with chemotherapy  . Colon cancer ([place]) 11/26/2014  . Diabetes mellitus without complication ([place])   . Hypercholesteremia   . Hypertension   . Personal history of chemotherapy 2009   colon ca    Patient Active Problem List   Diagnosis Date Noted  . SOB (shortness of breath) on exertion 12/22/2014  . Colon cancer ([place]) 11/26/2014  . CCF (congestive cardiac failure) ([place]) 10/01/2014  . Diabetes mellitus, type 2 ([place]) 10/01/2014  . Arteriosclerosis of coronary artery 10/01/2014  . BP (high blood pressure) 10/01/2014  . Diabetes ([place]) 12/10/2013  . Adiposity 12/10/2013    Past Surgical History:  Procedure Laterality Date  . ABDOMINAL HYSTERECTOMY     PARTIAL  . COLECTOMY    . PERIPHERAL VASCULAR CATHETERIZATION N/A 01/26/2015   Procedure: Glori Luis Cath Removal;  Surgeon: Katha Cabal, MD;  Location: [facility];  Service: Cardiovascular;  Laterality: N/A;  . UPPER GI ENDOSCOPY      Prior to Admission medications   Medication Sig Start Date End Date Taking? Authorizing Provider  allopurinol (ZYLOPRIM) 100 MG tablet Take 100 mg by mouth 2 (two) times daily.     [provider]    ALPRAZolam Duanne Moron) 0.25 MG tablet Take 0.25 mg by mouth at bedtime as needed for anxiety.    [provider]  aspirin 81 MG EC tablet Take 81 mg by mouth daily. Swallow whole.    [provider]  b complex vitamins capsule Take 1 capsule by mouth daily.    [provider]  cloNIDine (CATAPRES) 0.3 MG tablet 0.3 mg 3 (three) times daily.  11/24/14   [provider]  colchicine 0.6 MG tablet Take 2 tablets by mouth as needed. 08/28/14   [provider]  cyclobenzaprine (FLEXERIL) 10 MG tablet Take 1 tablet (10 mg total) by mouth 3 (three) times daily as needed. 06/06/17   Sable Feil, PA-C  ferrous sulfate 325 (65 FE) MG EC tablet Take 325 mg by mouth every morning. Reported on 06/11/2015    [provider]  furosemide (LASIX) 40 MG tablet Take 40 mg by mouth 2 (two) times daily. 09/28/14   [provider]  HUMALOG MIX 75/25 KWIKPEN (75-25) 100 UNIT/ML Claiborne Rigg  11/04/14   [provider]  hydrALAZINE (APRESOLINE) 50 MG tablet Take 100 mg by mouth 3 (three) times daily.     [provider]  insulin lispro (HUMALOG) 100 UNIT/ML injection Inject 32 Units into the skin 2 (two) times daily before a meal. Reported on 06/11/2015    [provider]  Magnesium 250 MG TABS Take 2 tablets by mouth 2 (two) times daily.    [provider]  metFORMIN (GLUCOPHAGE) 1000 MG  tablet Take 1,000 mg by mouth 2 (two) times daily with a meal.    [provider]  metoprolol (TOPROL-XL) 200 MG 24 hr tablet Take 200 mg by mouth daily.    [provider]  olmesartan (BENICAR) 40 MG tablet Take 40 mg by mouth daily.    [provider]  potassium chloride (K-DUR) 10 MEQ tablet TAKE ONE TABLET BY MOUTH TWICE DAILY 12/30/14   Herring, Orville Govern, NP  pravastatin (PRAVACHOL) 20 MG tablet Take 20 mg by mouth daily.    [provider]  sertraline (ZOLOFT) 50 MG tablet Take 50 mg by mouth daily.    [provider]  torsemide (DEMADEX) 20 MG tablet Take 20 mg by mouth daily.    [provider]  traMADol (ULTRAM) 50 MG tablet Take 1 tablet (50 mg total) by mouth every 6 (six) hours as needed. 06/06/17 06/06/18  Sable Feil, PA-C    Allergies Amlodipine  Family History  Problem Relation Age of Onset  . Colon cancer Mother        "had some type of tumor in the vulva liver unknown primary"  . Colon cancer Brother        "died at young age"    Social History Social History   Tobacco Use  . Smoking status: Former Smoker    Packs/day: 1.00    Years: 3.00    Pack years: 3.00  . Smokeless tobacco: Never Used  Substance Use Topics  . Alcohol use: Yes    Alcohol/week: 0.6 oz    Types: 1 Cans of beer per week    Comment: socially beer  . Drug use: No    Review of Systems Constitutional: No fever/chills Eyes: No visual changes. ENT: No sore throat. Cardiovascular: Denies chest pain. Respiratory: Denies shortness of breath. Gastrointestinal: No abdominal pain.  No nausea, no vomiting.  No diarrhea.  No constipation. Genitourinary: Negative for dysuria. Musculoskeletal: Positive for back pain. Skin: Negative for rash. Neurological: Negative for headaches, focal weakness or numbness. Endocrine:Diabetes and hypertension Allergic/Immunilogical: See medication list  ____________________________________________   PHYSICAL EXAM:  VITAL SIGNS: ED Triage Vitals [06/06/17 1049]  Enc Vitals Group     BP (!) 187/95     Pulse Rate 87     Resp 16     Temp 97.7 F (36.5 C)     Temp Source Oral     SpO2 96 %     Weight 222 lb (100.7 kg)     Height 5\' 4"  (1.626 m)     Head Circumference      Peak Flow      Pain Score 10     Pain Loc      Pain Edu?      Excl. in [place]?    Constitutional: Alert and oriented. Well appearing and in no acute distress. Cardiovascular: Normal rate, regular rhythm. Grossly normal heart sounds.  Good peripheral circulation. Elevated blood  pressure. Patient did not take blood pressure medication prior to arrival. Respiratory: Normal respiratory effort.  No retractions. Lungs CTAB. Gastrointestinal: Soft and nontender. No Musculoskeletal: No DEFORMITIES of the lumbar spine. Patient moderate guarding palpation of L2-L4. Patient decreased range of motion with flexion and left lateral movements. Patient extremely leg test. Neurologic:  Normal speech and language. No gross focal neurologic deficits are appreciated. No gait instability. Skin:  Skin is warm, dry and intact. No rash noted. Psychiatric: Mood and affect are normal. Speech and behavior are normal.  ____________________________________________   LABS (all labs ordered are listed, but only abnormal results are displayed)  Labs Reviewed - No data to display ____________________________________________  EKG   ____________________________________________  RADIOLOGY  Dg Lumbar Spine Complete  Result Date: 06/06/2017 CLINICAL DATA:  Low back pain over the last several days. EXAM: LUMBAR SPINE - COMPLETE 4+ VIEW COMPARISON:  CT abdomen 08/02/2012 FINDINGS: Five lumbar type vertebral bodies. Straightening of the normal lumbar lordosis. Mild disc space narrowing L4-5. Mild lower lumbar facet arthritis. No advanced degenerative change. No fracture or other focal lesion. IMPRESSION: No acute or traumatic finding. 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 Electronically Signed   By: Nelson Chimes M.D.   On: 06/06/2017 11:41    ____________________________________________   PROCEDURES  Procedure(s) performed: None  Procedures  Critical Care performed: No  ____________________________________________   INITIAL IMPRESSION / ASSESSMENT AND PLAN / ED COURSE  As part of my medical decision making, I reviewed the following data within the electronic MEDICAL RECORD NUMBER    Back pain secondary to lumbar strain and mild arthritis. Discussed  x-ray finding with patient. Patient given discharge care instruction prostate medication as directed. Patient advised follow-up with PCP for continual care.      ____________________________________________   FINAL CLINICAL IMPRESSION(S) / ED DIAGNOSES  Final diagnoses:  Strain of lumbar region, initial encounter  Spondylosis of lumbar region without myelopathy or radiculopathy     ED Discharge Orders        Ordered    cyclobenzaprine (FLEXERIL) 10 MG tablet  3 times daily PRN     06/06/17 1156    traMADol (ULTRAM) 50 MG tablet  Every 6 hours PRN     06/06/17 1156       Note:  This document was prepared using Dragon voice recognition software and may include unintentional dictation errors.    Sable Feil, PA-C 06/06/17 1159    Earleen Newport, MD 06/06/17 [phone]

## 2017-07-13 ENCOUNTER — Encounter: Payer: Self-pay | Admitting: Student

## 2017-07-16 ENCOUNTER — Ambulatory Visit
Admission: RE | Admit: 2017-07-16 | Discharge: 2017-07-16 | Disposition: A | Discharge status: Home / Self Care | Payer: Medicare Other | Source: Ambulatory Visit | Attending: Gastroenterology | Admitting: Gastroenterology

## 2017-07-16 ENCOUNTER — Encounter: Payer: Self-pay | Admitting: Anesthesiology

## 2017-07-16 ENCOUNTER — Ambulatory Visit: Payer: Medicare Other | Admitting: Anesthesiology

## 2017-07-16 ENCOUNTER — Encounter
Admission: RE | Disposition: A | Discharge status: Home / Self Care | Payer: Self-pay | Source: Ambulatory Visit | Attending: Gastroenterology

## 2017-07-16 DIAGNOSIS — Z8371 Family history of colonic polyps: Secondary | ICD-10-CM | POA: Insufficient documentation

## 2017-07-16 DIAGNOSIS — Z1211 Encounter for screening for malignant neoplasm of colon: Secondary | ICD-10-CM | POA: Insufficient documentation

## 2017-07-16 DIAGNOSIS — I2510 Atherosclerotic heart disease of native coronary artery without angina pectoris: Secondary | ICD-10-CM | POA: Insufficient documentation

## 2017-07-16 DIAGNOSIS — E7800 Pure hypercholesterolemia, unspecified: Secondary | ICD-10-CM | POA: Insufficient documentation

## 2017-07-16 DIAGNOSIS — K635 Polyp of colon: Secondary | ICD-10-CM | POA: Diagnosis not present

## 2017-07-16 DIAGNOSIS — Z980 Intestinal bypass and anastomosis status: Secondary | ICD-10-CM | POA: Insufficient documentation

## 2017-07-16 DIAGNOSIS — E669 Obesity, unspecified: Secondary | ICD-10-CM | POA: Diagnosis not present

## 2017-07-16 DIAGNOSIS — D123 Benign neoplasm of transverse colon: Secondary | ICD-10-CM | POA: Diagnosis not present

## 2017-07-16 DIAGNOSIS — Z79899 Other long term (current) drug therapy: Secondary | ICD-10-CM | POA: Insufficient documentation

## 2017-07-16 DIAGNOSIS — I110 Hypertensive heart disease with heart failure: Secondary | ICD-10-CM | POA: Insufficient documentation

## 2017-07-16 DIAGNOSIS — Z794 Long term (current) use of insulin: Secondary | ICD-10-CM | POA: Insufficient documentation

## 2017-07-16 DIAGNOSIS — Z86010 Personal history of colonic polyps: Secondary | ICD-10-CM | POA: Insufficient documentation

## 2017-07-16 DIAGNOSIS — I509 Heart failure, unspecified: Secondary | ICD-10-CM | POA: Diagnosis not present

## 2017-07-16 DIAGNOSIS — Z9221 Personal history of antineoplastic chemotherapy: Secondary | ICD-10-CM | POA: Diagnosis not present

## 2017-07-16 DIAGNOSIS — D120 Benign neoplasm of cecum: Secondary | ICD-10-CM | POA: Insufficient documentation

## 2017-07-16 DIAGNOSIS — E119 Type 2 diabetes mellitus without complications: Secondary | ICD-10-CM | POA: Diagnosis not present

## 2017-07-16 DIAGNOSIS — Z800 Family history of malignant neoplasm of digestive organs: Secondary | ICD-10-CM | POA: Insufficient documentation

## 2017-07-16 DIAGNOSIS — F329 Major depressive disorder, single episode, unspecified: Secondary | ICD-10-CM | POA: Diagnosis not present

## 2017-07-16 DIAGNOSIS — Z6838 Body mass index (BMI) 38.0-38.9, adult: Secondary | ICD-10-CM | POA: Insufficient documentation

## 2017-07-16 DIAGNOSIS — Z85038 Personal history of other malignant neoplasm of large intestine: Secondary | ICD-10-CM | POA: Diagnosis not present

## 2017-07-16 DIAGNOSIS — Z7982 Long term (current) use of aspirin: Secondary | ICD-10-CM | POA: Insufficient documentation

## 2017-07-16 DIAGNOSIS — Z87891 Personal history of nicotine dependence: Secondary | ICD-10-CM | POA: Diagnosis not present

## 2017-07-16 HISTORY — DX: Depression, unspecified: F32.A

## 2017-07-16 HISTORY — PX: COLONOSCOPY WITH PROPOFOL: SHX5780

## 2017-07-16 HISTORY — DX: Atherosclerotic heart disease of native coronary artery without angina pectoris: I25.10

## 2017-07-16 HISTORY — DX: Obesity, unspecified: E66.9

## 2017-07-16 HISTORY — DX: Major depressive disorder, single episode, unspecified: F32.9

## 2017-07-16 LAB — GLUCOSE, CAPILLARY: Glucose-Capillary: 158 mg/dL — ABNORMAL HIGH (ref 65–99)

## 2017-07-16 SURGERY — COLONOSCOPY WITH PROPOFOL
Anesthesia: General

## 2017-07-16 MED ORDER — PROPOFOL 500 MG/50ML IV EMUL
INTRAVENOUS | Status: DC | PRN
  Administered 2017-07-16: 120 ug/kg/min via INTRAVENOUS

## 2017-07-16 MED ORDER — FENTANYL CITRATE (PF) 100 MCG/2ML IJ SOLN
INTRAMUSCULAR | Status: AC
  Filled 2017-07-16: qty 2

## 2017-07-16 MED ORDER — MIDAZOLAM HCL 2 MG/2ML IJ SOLN
INTRAMUSCULAR | Status: DC | PRN
  Administered 2017-07-16: 2 mg via INTRAVENOUS

## 2017-07-16 MED ORDER — PROPOFOL 500 MG/50ML IV EMUL
INTRAVENOUS | Status: AC
  Filled 2017-07-16: qty 50

## 2017-07-16 MED ORDER — LIDOCAINE HCL (PF) 1 % IJ SOLN
INTRAMUSCULAR | Status: AC
  Administered 2017-07-16: 0.3 mL via INTRADERMAL
  Filled 2017-07-16: qty 2

## 2017-07-16 MED ORDER — SODIUM CHLORIDE 0.9 % IV SOLN: INTRAVENOUS | Status: DC

## 2017-07-16 MED ORDER — FENTANYL CITRATE (PF) 100 MCG/2ML IJ SOLN
INTRAMUSCULAR | Status: DC | PRN
  Administered 2017-07-16: 25 ug via INTRAVENOUS
  Administered 2017-07-16: 50 ug via INTRAVENOUS
  Administered 2017-07-16: 25 ug via INTRAVENOUS

## 2017-07-16 MED ORDER — SODIUM CHLORIDE 0.9 % IV SOLN
INTRAVENOUS | Status: DC
  Administered 2017-07-16: 1000 mL via INTRAVENOUS

## 2017-07-16 MED ORDER — MIDAZOLAM HCL 2 MG/2ML IJ SOLN
INTRAMUSCULAR | Status: AC
  Filled 2017-07-16: qty 2

## 2017-07-16 MED ORDER — LIDOCAINE HCL (PF) 1 % IJ SOLN
2.0000 mL | Freq: Once | INTRAMUSCULAR | Status: AC
  Administered 2017-07-16: 0.3 mL via INTRADERMAL

## 2017-07-16 NOTE — Anesthesia Post-op Follow-up Note (Signed)
Anesthesia QCDR form completed.        

## 2017-07-16 NOTE — Op Note (Addendum)
[facility] Gastroenterology Patient Name: Priscilla Villanueva Procedure Date: 07/16/2017 7:29 AM MRN: [id number] Account #: [id number] Date of Birth: 1950/07/11 Admit Type: Outpatient Age: 67 Room: [facility] ENDO ROOM 3 Gender: Female Note Status: Finalized Procedure:            Colonoscopy Indications:          Family history of colon cancer in a first-degree                        relative, Family history of colonic polyps in a                        first-degree relative, Personal history of colonic                        polyps Providers:            Lollie Sails, MD Referring MD:         Tracie Harrier, MD (Referring MD) Medicines:            Monitored Anesthesia Care Complications:        No immediate complications. Procedure:            Pre-Anesthesia Assessment:                       - ASA Grade Assessment: III - A patient with severe                        systemic disease.                       After obtaining informed consent, the colonoscope was                        passed under direct vision. Throughout the procedure,                        the patient's blood pressure, pulse, and oxygen                        saturations were monitored continuously. The                        Colonoscope was introduced through the anus and                        advanced to the the cecum, identified by appendiceal                        orifice and ileocecal valve. The colonoscopy was                        unusually difficult due to poor bowel prep and the                        patient's body habitus. Successful completion of the                        procedure was aided by lavage. Findings:      A 7 mm polyp was found in the transverse colon. The polyp was  semi-pedunculated.      A 11 mm polyp was found in the cecum. The polyp was pedunculated. The       polyp was removed with a cold snare. Resection and retrieval were       complete.      There was  evidence of a prior end-to-side colo-colonic anastomosis at 38       cm proximal to the anus. This was characterized by healthy appearing       mucosa. The anastomosis was traversed. The colon continure through a       shortened pathway to the cecum. There is a blind pouch limb that enters       the colon at 38 cm and extends to the terminus of the pouch with a       length of about 10 cm. There is a deep staple or stitch at the terminus.       There was a polyp about 2-3 cm from the terminal end, 2 mm in size,       removed and retrieved with a cold forcep.      The exam was otherwise without abnormality.      The digital rectal exam was normal. Impression:           - One 7 mm polyp in the transverse colon.                       - One 11 mm polyp in the cecum, removed with a cold                        snare. Resected and retrieved.                       - End-to-side colo-colonic anastomosis, characterized                        by healthy appearing mucosa.                       - The examination was otherwise normal. Recommendation:       - Discharge patient to home.                       - Await pathology results.                       - Return to GI clinic in 2 weeks.                       - Check CEA in 2 weeks. Procedure Code(s):    --- Professional ---                       [phone], Colonoscopy, flexible; with removal of tumor(s),                        polyp(s), or other lesion(s) by snare technique Diagnosis Code(s):    --- Professional ---                       D12.3, Benign neoplasm of transverse colon (hepatic                        flexure or splenic  flexure)                       D12.0, Benign neoplasm of cecum                       Z98.0, Intestinal bypass and anastomosis status                       Z80.0, Family history of malignant neoplasm of                        digestive organs                       Z83.71, Family history of colonic polyps                        Z86.010, Personal history of colonic polyps CPT copyright 2016 American Medical Association. All rights reserved. The codes documented in this report are preliminary and upon coder review may  be revised to meet current compliance requirements. Lollie Sails, MD 07/16/2017 9:05:19 AM This report has been signed electronically. Number of Addenda: 0 Note Initiated On: 07/16/2017 7:29 AM Scope Withdrawal Time: 0 hours 27 minutes 25 seconds  Total Procedure Duration: 0 hours 50 minutes 12 seconds       [facility]

## 2017-07-16 NOTE — Anesthesia Postprocedure Evaluation (Signed)
Anesthesia Post Note  Patient: Priscilla Villanueva  Procedure(s) Performed: COLONOSCOPY WITH PROPOFOL (N/A )  Patient location during evaluation: Endoscopy Anesthesia Type: General Level of consciousness: awake and alert Pain management: pain level controlled Vital Signs Assessment: post-procedure vital signs reviewed and stable Respiratory status: spontaneous breathing, nonlabored ventilation, respiratory function stable and patient connected to nasal cannula oxygen Cardiovascular status: blood pressure returned to baseline and stable Postop Assessment: no apparent nausea or vomiting Anesthetic complications: no     Last Vitals:  Vitals:   07/16/17 0706 07/16/17 0900  BP: (!) 153/86 (!) 160/102  Pulse: 92   Resp:  20  Temp:  (!) 36.2 C  SpO2: 96% 99%    Last Pain:  Vitals:   07/16/17 0900  TempSrc: Tympanic                 Bailee Thall S

## 2017-07-16 NOTE — H&P (Signed)
Outpatient short stay form Pre-procedure 07/16/2017 7:50 AM Priscilla Sails MD  Primary Physician: Dr Tracie Harrier  Reason for visit: Colonoscopy  History of present illness: Patient is a 67 year old female presenting today as above.  She has a history of colon cancer with surgery in 2010, initial colostomy with subsequent reversal.  Her last colonoscopy was in 4014 noted to be normal.  She has a elevated CEA for several years though not increasing.  There is also a family history of colon cancer and polyps in multiple primary relatives.  She tolerated her prep well and takes no asa or blood thinners.     Current Facility-Administered Medications:  .  0.9 %  sodium chloride infusion, , Intravenous, Continuous, Priscilla Sails, MD, Last Rate: 20 mL/hr at 07/16/17 0725, 1,000 mL at 07/16/17 0725 .  0.9 %  sodium chloride infusion, , Intravenous, Continuous, Priscilla Sails, MD  Medications Prior to Admission  Medication Sig Dispense Refill Last Dose  . aspirin 81 MG EC tablet Take 81 mg by mouth daily. Swallow whole.   Past Month at Unknown time  . liraglutide (VICTOZA) 18 MG/3ML SOPN Inject 0.6 mg into the skin.     . magnesium oxide (MAG-OX) 400 MG tablet Take 400 mg by mouth daily.     . polyethylene glycol (MIRALAX / GLYCOLAX) packet Take 17 g by mouth daily.     Marland Kitchen spironolactone (ALDACTONE) 25 MG tablet Take 25 mg by mouth daily.   07/16/2017 at 0600  . allopurinol (ZYLOPRIM) 100 MG tablet Take 100 mg by mouth 2 (two) times daily.    Taking  . ALPRAZolam (XANAX) 0.25 MG tablet Take 0.25 mg by mouth at bedtime as needed for anxiety.   Taking  . b complex vitamins capsule Take 1 capsule by mouth daily.   Taking  . cloNIDine (CATAPRES) 0.3 MG tablet 0.3 mg 3 (three) times daily.    07/16/2017 at 0600  . colchicine 0.6 MG tablet Take 2 tablets by mouth as needed.   Taking  . cyclobenzaprine (FLEXERIL) 10 MG tablet Take 1 tablet (10 mg total) by mouth 3 (three) times daily as  needed. 15 tablet 0   . ferrous sulfate 325 (65 FE) MG EC tablet Take 325 mg by mouth every morning. Reported on 06/11/2015   Taking  . furosemide (LASIX) 40 MG tablet Take 40 mg by mouth 2 (two) times daily.   Taking  . HUMALOG MIX 75/25 KWIKPEN (75-25) 100 UNIT/ML Kwikpen    Not Taking  . hydrALAZINE (APRESOLINE) 50 MG tablet Take 100 mg by mouth 3 (three) times daily.    07/16/2017 at 0600  . insulin lispro (HUMALOG) 100 UNIT/ML injection Inject 18 Units into the skin 2 (two) times daily before a meal. Reported on 06/11/2015   07/16/2017  . Magnesium 250 MG TABS Take 2 tablets by mouth 2 (two) times daily.   Taking  . metFORMIN (GLUCOPHAGE) 1000 MG tablet Take 1,000 mg by mouth 2 (two) times daily with a meal.   Taking  . metoprolol (TOPROL-XL) 200 MG 24 hr tablet Take 200 mg by mouth daily.   07/16/2017 at 0600  . olmesartan (BENICAR) 40 MG tablet Take 40 mg by mouth daily.   Taking  . potassium chloride (K-DUR) 10 MEQ tablet TAKE ONE TABLET BY MOUTH TWICE DAILY 60 tablet 0 Taking  . pravastatin (PRAVACHOL) 20 MG tablet Take 20 mg by mouth daily.   Taking  . sertraline (ZOLOFT) 50 MG tablet Take  50 mg by mouth daily.   Not Taking  . torsemide (DEMADEX) 20 MG tablet Take 20 mg by mouth daily.   Taking  . traMADol (ULTRAM) 50 MG tablet Take 1 tablet (50 mg total) by mouth every 6 (six) hours as needed. 20 tablet 0      Allergies  Allergen Reactions  . Amlodipine Swelling    Causes pt's feet and legs to swell...  . Hydrochlorothiazide Other (See Comments)  . Lisinopril Other (See Comments)  . Sitagliptin Diarrhea  . Zoloft [Sertraline] Other (See Comments)     Past Medical History:  Diagnosis Date  . CAD (coronary artery disease)   . CHF (congestive heart failure) ([place])   . Colon cancer ([place]) 2009   with chemotherapy  . Colon cancer ([place]) 11/26/2014  . Depression   . Diabetes mellitus without complication ([place])   . Hypercholesteremia   . Hypertension   . Obesity   . Personal  history of chemotherapy 2009   colon ca    Review of systems:      Physical Exam    Heart and lungs: Regular rate and rhythm without rub or gallop, lungs are bilaterally clear    HEENT: Normocephalic atraumatic eyes are anicteric    Other:    Pertinant exam for procedure: Soft nontender nondistended bowel sounds positive normoactive.    Planned proceedures: Colonoscopy and indicated procedures. I have discussed the risks benefits and complications of procedures to include not limited to bleeding, infection, perforation and the risk of sedation and the patient wishes to proceed.    Priscilla Sails, MD Gastroenterology 07/16/2017  7:50 AM

## 2017-07-16 NOTE — Anesthesia Preprocedure Evaluation (Signed)
Anesthesia Evaluation  Patient identified by MRN, date of birth, ID band Patient awake    Reviewed: Allergy & Precautions, NPO status , Patient's Chart, lab work & pertinent test results, reviewed documented beta blocker date and time   Airway Mallampati: III  TM Distance: >3 FB     Dental  (+) Chipped   Pulmonary former smoker,           Cardiovascular hypertension, Pt. on medications and Pt. on home beta blockers + CAD and +CHF       Neuro/Psych PSYCHIATRIC DISORDERS Depression    GI/Hepatic   Endo/Other  diabetes, Type 2  Renal/GU      Musculoskeletal   Abdominal   Peds  Hematology   Anesthesia Other Findings Gout.  Reproductive/Obstetrics                             Anesthesia Physical Anesthesia Plan  ASA: III  Anesthesia Plan: General   Post-op Pain Management:    Induction: Intravenous  PONV Risk Score and Plan:   Airway Management Planned:   Additional Equipment:   Intra-op Plan:   Post-operative Plan:   Informed Consent: I have reviewed the patients History and Physical, chart, labs and discussed the procedure including the risks, benefits and alternatives for the proposed anesthesia with the patient or authorized representative who has indicated his/her understanding and acceptance.     Plan Discussed with: CRNA  Anesthesia Plan Comments:         Anesthesia Quick Evaluation

## 2017-07-16 NOTE — Transfer of Care (Signed)
Immediate Anesthesia Transfer of Care Note  Patient: Priscilla Villanueva  Procedure(s) Performed: COLONOSCOPY WITH PROPOFOL (N/A )  Patient Location: PACU  Anesthesia Type:General  Level of Consciousness: awake and sedated  Airway & Oxygen Therapy: Patient Spontanous Breathing and Patient connected to nasal cannula oxygen  Post-op Assessment: Report given to RN and Post -op Vital signs reviewed and stable  Post vital signs: Reviewed and stable  Last Vitals:  Vitals:   07/16/17 0704 07/16/17 0706  BP:  (!) 153/86  Pulse:  92  Resp: 17   Temp: 36.8 C   SpO2:  96%    Last Pain:  Vitals:   07/16/17 0704  TempSrc: Tympanic         Complications: No apparent anesthesia complications

## 2017-07-16 NOTE — Anesthesia Postprocedure Evaluation (Signed)
Anesthesia Post Note  Patient: Priscilla Villanueva  Procedure(s) Performed: COLONOSCOPY WITH PROPOFOL (N/A )  Patient location during evaluation: Endoscopy Anesthesia Type: General Level of consciousness: awake and alert Pain management: pain level controlled Vital Signs Assessment: post-procedure vital signs reviewed and stable Respiratory status: spontaneous breathing, nonlabored ventilation, respiratory function stable and patient connected to nasal cannula oxygen Cardiovascular status: blood pressure returned to baseline and stable Postop Assessment: no apparent nausea or vomiting Anesthetic complications: no     Last Vitals:  Vitals:   07/16/17 0706 07/16/17 0900  BP: (!) 153/86 (!) 160/102  Pulse: 92   Resp:  20  Temp:  (!) 36.2 C  SpO2: 96% 99%    Last Pain:  Vitals:   07/16/17 0900  TempSrc: Tympanic                 [name] S

## 2017-07-16 NOTE — Anesthesia Procedure Notes (Signed)
Performed by: Cook-Martin, Shiara Mcgough Pre-anesthesia Checklist: Patient identified, Emergency Drugs available, Suction available, Patient being monitored and Timeout performed Patient Re-evaluated:Patient Re-evaluated prior to induction Oxygen Delivery Method: Nasal cannula Preoxygenation: Pre-oxygenation with 100% oxygen Induction Type: IV induction Placement Confirmation: positive ETCO2 and CO2 detector       

## 2017-07-17 ENCOUNTER — Encounter: Payer: Self-pay | Admitting: Gastroenterology

## 2017-07-17 LAB — SURGICAL PATHOLOGY

## 2017-09-15 ENCOUNTER — Other Ambulatory Visit: Payer: Self-pay

## 2017-09-15 DIAGNOSIS — Z87891 Personal history of nicotine dependence: Secondary | ICD-10-CM | POA: Insufficient documentation

## 2017-09-15 DIAGNOSIS — R040 Epistaxis: Secondary | ICD-10-CM | POA: Insufficient documentation

## 2017-09-15 DIAGNOSIS — I10 Essential (primary) hypertension: Secondary | ICD-10-CM | POA: Diagnosis not present

## 2017-09-15 NOTE — ED Triage Notes (Signed)
Reports nose bleed at 10:25 lasting for approximately 30 minutes from both nares, but more from the right.  Reports having congestion for several days.  No active bleeding at this time.

## 2017-09-15 NOTE — ED Triage Notes (Signed)
Nose clamp placed at stat desk.

## 2017-09-16 ENCOUNTER — Other Ambulatory Visit: Payer: Self-pay

## 2017-09-16 ENCOUNTER — Emergency Department: Payer: Medicare Other

## 2017-09-16 ENCOUNTER — Emergency Department
Admission: EM | Admit: 2017-09-16 | Discharge: 2017-09-16 | Disposition: A | Discharge status: Home / Self Care | Payer: Medicare Other | Attending: Emergency Medicine | Admitting: Emergency Medicine

## 2017-09-16 DIAGNOSIS — I10 Essential (primary) hypertension: Secondary | ICD-10-CM

## 2017-09-16 DIAGNOSIS — R040 Epistaxis: Secondary | ICD-10-CM

## 2017-09-16 LAB — BASIC METABOLIC PANEL
Anion gap: 7 (ref 5–15)
BUN: 24 mg/dL — AB (ref 6–20)
CHLORIDE: 100 mmol/L — AB (ref 101–111)
CO2: 28 mmol/L (ref 22–32)
Calcium: 9.1 mg/dL (ref 8.9–10.3)
Creatinine, Ser: 1.55 mg/dL — ABNORMAL HIGH (ref 0.44–1.00)
GFR calc Af Amer: 40 mL/min — ABNORMAL LOW (ref >60)
GFR calc non Af Amer: 34 mL/min — ABNORMAL LOW (ref >60)
GLUCOSE: 206 mg/dL — AB (ref 65–99)
POTASSIUM: 3.9 mmol/L (ref 3.5–5.1)
SODIUM: 135 mmol/L (ref 135–145)

## 2017-09-16 LAB — CBC WITH DIFFERENTIAL/PLATELET
Basophils Absolute: 0 10*3/uL (ref 0–0.1)
Basophils Relative: 1 %
Eosinophils Absolute: 0.1 10*3/uL (ref 0–0.7)
Eosinophils Relative: 1 %
HCT: 38.5 % (ref 35.0–47.0)
HEMOGLOBIN: 12.6 g/dL (ref 12.0–16.0)
LYMPHS ABS: 0.8 10*3/uL — AB (ref 1.0–3.6)
LYMPHS PCT: 10 %
MCH: 25.8 pg — AB (ref 26.0–34.0)
MCHC: 32.8 g/dL (ref 32.0–36.0)
MCV: 78.8 fL — AB (ref 80.0–100.0)
Monocytes Absolute: 0.3 10*3/uL (ref 0.2–0.9)
Monocytes Relative: 5 %
NEUTROS PCT: 83 %
Neutro Abs: 6.3 10*3/uL (ref 1.4–6.5)
Platelets: 193 10*3/uL (ref 150–440)
RBC: 4.89 MIL/uL (ref 3.80–5.20)
RDW: 17.2 % — ABNORMAL HIGH (ref 11.5–14.5)
WBC: 7.5 10*3/uL (ref 3.6–11.0)

## 2017-09-16 LAB — TROPONIN I

## 2017-09-16 MED ORDER — OXYMETAZOLINE HCL 0.05 % NA SOLN
1.0000 | Freq: Once | NASAL | Status: AC
  Administered 2017-09-16: 1 via NASAL

## 2017-09-16 MED ORDER — OXYMETAZOLINE HCL 0.05 % NA SOLN
NASAL | Status: AC
  Administered 2017-09-16: 1 via NASAL
  Filled 2017-09-16: qty 15

## 2017-09-16 NOTE — ED Notes (Signed)
Patient resting quietly at this time.  Voices no complaints.

## 2017-09-16 NOTE — ED Notes (Signed)
MD at bedside. 

## 2017-09-16 NOTE — Discharge Instructions (Signed)
1.  Continue all medicines as directed by your doctor. 2.  If your nose rebleeds, apply nasal clamp and ice to the bridge of your nose.  If nose is still bleeding after 20 minutes, please return to the ER for evaluation. 3.  Return to the ER for recurrent or worsening symptoms, persistent vomiting, difficulty breathing or other concerns.

## 2017-09-16 NOTE — ED Provider Notes (Signed)
[facility] Emergency Department Provider Note   ____________________________________________   First MD Initiated Contact with Patient 09/16/17 0106     (approximate)  I have reviewed the triage vital signs and the nursing notes.   HISTORY  Chief Complaint Epistaxis    HPI Priscilla Villanueva is a 67 y.o. female who presents to the ED from home with a chief complaint of nosebleed.  Patient reports nasal and chest congestion this week with frequent nose blowing.  At 10:25 PM she noted a right-sided nosebleed which then trickled into the left side.  Symptoms lasted for approximately 30 minutes.  While in the lobby patient instantly felt dizzy and short of breath.  Denies recent fever, chills, chest pain, abdominal pain, nausea, vomiting.  Denies recent travel or trauma.   Past Medical History:  Diagnosis Date  . CAD (coronary artery disease)   . CHF (congestive heart failure) ([place])   . Colon cancer ([place]) 2009   with chemotherapy  . Colon cancer ([place]) 11/26/2014  . Depression   . Diabetes mellitus without complication ([place])   . Hypercholesteremia   . Hypertension   . Obesity   . Personal history of chemotherapy 2009   colon ca    Patient Active Problem List   Diagnosis Date Noted  . SOB (shortness of breath) on exertion 12/22/2014  . Colon cancer ([place]) 11/26/2014  . CCF (congestive cardiac failure) ([place]) 10/01/2014  . Diabetes mellitus, type 2 ([place]) 10/01/2014  . Arteriosclerosis of coronary artery 10/01/2014  . BP (high blood pressure) 10/01/2014  . Diabetes ([place]) 12/10/2013  . Adiposity 12/10/2013    Past Surgical History:  Procedure Laterality Date  . ABDOMINAL HYSTERECTOMY     PARTIAL  . COLECTOMY    . COLONOSCOPY WITH PROPOFOL N/A 07/16/2017   Procedure: COLONOSCOPY WITH PROPOFOL;  Surgeon: Lollie Sails, MD;  Location: [facility] ENDOSCOPY;  Service: Endoscopy;  Laterality: N/A;  . PERIPHERAL VASCULAR CATHETERIZATION N/A 01/26/2015   Procedure: Glori Luis Cath Removal;  Surgeon: Katha Cabal, MD;  Location: [facility] CV LAB;  Service: Cardiovascular;  Laterality: N/A;  . UPPER GI ENDOSCOPY      Prior to Admission medications   Medication Sig Start Date End Date Taking? Authorizing Provider  allopurinol (ZYLOPRIM) 100 MG tablet Take 100 mg by mouth 2 (two) times daily.     [provider]  ALPRAZolam Duanne Moron) 0.25 MG tablet Take 0.25 mg by mouth at bedtime as needed for anxiety.    [provider]  aspirin 81 MG EC tablet Take 81 mg by mouth daily. Swallow whole.    [provider]  b complex vitamins capsule Take 1 capsule by mouth daily.    [provider]  cloNIDine (CATAPRES) 0.3 MG tablet 0.3 mg 3 (three) times daily.  11/24/14   [provider]  colchicine 0.6 MG tablet Take 2 tablets by mouth as needed. 08/28/14   [provider]  cyclobenzaprine (FLEXERIL) 10 MG tablet Take 1 tablet (10 mg total) by mouth 3 (three) times daily as needed. 06/06/17   Sable Feil, PA-C  ferrous sulfate 325 (65 FE) MG EC tablet Take 325 mg by mouth every morning. Reported on 06/11/2015    [provider]  furosemide (LASIX) 40 MG tablet Take 40 mg by mouth 2 (two) times daily. 09/28/14   [provider]  HUMALOG MIX 75/25 KWIKPEN (75-25) 100 UNIT/ML Claiborne Rigg  11/04/14   [provider]  hydrALAZINE (APRESOLINE) 50 MG tablet  Take 100 mg by mouth 3 (three) times daily.     [provider]  insulin lispro (HUMALOG) 100 UNIT/ML injection Inject 18 Units into the skin 2 (two) times daily before a meal. Reported on 06/11/2015    [provider]  liraglutide (VICTOZA) 18 MG/3ML SOPN Inject 0.6 mg into the skin.    [provider]  Magnesium 250 MG TABS Take 2 tablets by mouth 2 (two) times daily.    [provider]  magnesium oxide (MAG-OX) 400 MG tablet Take 400 mg by mouth daily.    [provider]  metFORMIN (GLUCOPHAGE)  1000 MG tablet Take 1,000 mg by mouth 2 (two) times daily with a meal.    [provider]  metoprolol (TOPROL-XL) 200 MG 24 hr tablet Take 200 mg by mouth daily.    [provider]  olmesartan (BENICAR) 40 MG tablet Take 40 mg by mouth daily.    [provider]  polyethylene glycol (MIRALAX / GLYCOLAX) packet Take 17 g by mouth daily.    [provider]  potassium chloride (K-DUR) 10 MEQ tablet TAKE ONE TABLET BY MOUTH TWICE DAILY 12/30/14   Herring, Orville Govern, NP  pravastatin (PRAVACHOL) 20 MG tablet Take 20 mg by mouth daily.    [provider]  sertraline (ZOLOFT) 50 MG tablet Take 50 mg by mouth daily.    [provider]  spironolactone (ALDACTONE) 25 MG tablet Take 25 mg by mouth daily.    [provider]  torsemide (DEMADEX) 20 MG tablet Take 20 mg by mouth daily.    [provider]  traMADol (ULTRAM) 50 MG tablet Take 1 tablet (50 mg total) by mouth every 6 (six) hours as needed. 06/06/17 06/06/18  Sable Feil, PA-C    Allergies Amlodipine; Hydrochlorothiazide; Lisinopril; Sitagliptin; and Zoloft [sertraline]  Family History  Problem Relation Age of Onset  . Colon cancer Mother        "had some type of tumor in the vulva liver unknown primary"  . Colon cancer Brother        "died at young age"    Social History Social History   Tobacco Use  . Smoking status: Former Smoker    Packs/day: 1.00    Years: 3.00    Pack years: 3.00  . Smokeless tobacco: Never Used  Substance Use Topics  . Alcohol use: Yes    Alcohol/week: 0.6 oz    Types: 1 Cans of beer per week    Comment: socially beer  . Drug use: No    Review of Systems  Constitutional: No fever/chills. Eyes: No visual changes. ENT: Positive for nosebleed.  No sore throat. Cardiovascular: Denies chest pain. Respiratory: Denies shortness of breath. Gastrointestinal: No abdominal pain.  No nausea, no vomiting.  No diarrhea.  No  constipation. Genitourinary: Negative for dysuria. Musculoskeletal: Negative for back pain. Skin: Negative for rash. Neurological: Negative for headaches, focal weakness or numbness.   ____________________________________________   PHYSICAL EXAM:  VITAL SIGNS: ED Triage Vitals  Enc Vitals Group     BP 09/15/17 2327 (!) 221/105     Pulse Rate 09/15/17 2327 (!) 110     Resp 09/15/17 2327 18     Temp 09/15/17 2327 98.3 F (36.8 C)     Temp Source 09/15/17 2327 Oral     SpO2 09/15/17 2327 95 %     Weight 09/15/17 2327 226 lb (102.5 kg)     Height 09/15/17 2327 5' 4.5" (1.638  m)     Head Circumference --      Peak Flow --      Pain Score 09/15/17 2341 0     Pain Loc --      Pain Edu? --      Excl. in [place]? --     Constitutional: Alert and oriented. Well appearing and in no acute distress. Eyes: Conjunctivae are normal. PERRL. EOMI. Head: Atraumatic. Nose: No active bleeding from either nares.  Irritation noted to right anterior septum. Mouth/Throat: Mucous membranes are moist.  Oropharynx non-erythematous. Neck: No stridor.  No carotid bruits. Cardiovascular: Normal rate, regular rhythm. Grossly normal heart sounds.  Good peripheral circulation. Respiratory: Normal respiratory effort.  No retractions. Lungs CTAB. Gastrointestinal: Soft and nontender. No distention. No abdominal bruits. No CVA tenderness. Musculoskeletal: No lower extremity tenderness nor edema.  No joint effusions. Neurologic:  Normal speech and language. No gross focal neurologic deficits are appreciated. No gait instability. Skin:  Skin is warm, dry and intact. No rash noted. Psychiatric: Mood and affect are normal. Speech and behavior are normal.  ____________________________________________   LABS (all labs ordered are listed, but only abnormal results are displayed)  Labs Reviewed  CBC WITH DIFFERENTIAL/PLATELET - Abnormal; Notable for the following components:      Result Value   MCV 78.8 (*)     MCH 25.8 (*)    RDW 17.2 (*)    Lymphs Abs 0.8 (*)    All other components within normal limits  BASIC METABOLIC PANEL - Abnormal; Notable for the following components:   Chloride 100 (*)    Glucose, Bld 206 (*)    BUN 24 (*)    Creatinine, Ser 1.55 (*)    GFR calc non Af Amer 34 (*)    GFR calc Af Amer 40 (*)    All other components within normal limits  TROPONIN I   ____________________________________________  EKG  ED ECG REPORT I, Maryl Blalock J, the attending physician, personally viewed and interpreted this ECG.   Date: 09/16/2017  EKG Time: 0111  Rate: 97  Rhythm: normal EKG, normal sinus rhythm  Axis: Normal  Intervals:none  ST&T Change: Nonspecific  ____________________________________________  RADIOLOGY  ED MD interpretation: No active cardiopulmonary process  Official radiology report(s): Dg Chest 2 View  Result Date: 09/16/2017 CLINICAL DATA:  Dizziness EXAM: CHEST - 2 VIEW COMPARISON:  05/26/2013 FINDINGS: No significant pleural effusion. Cardiomediastinal silhouette within normal limits. No focal airspace disease. No pneumothorax. IMPRESSION: No active cardiopulmonary disease. Electronically Signed   By: Donavan Foil M.D.   On: 09/16/2017 02:02    ____________________________________________   PROCEDURES  Procedure(s) performed: None  Procedures  Critical Care performed: No  ____________________________________________   INITIAL IMPRESSION / ASSESSMENT AND PLAN / ED COURSE  As part of my medical decision making, I reviewed the following data within the [place] History obtained from family, Nursing notes reviewed and incorporated, Labs reviewed, Radiograph reviewed and Notes from prior ED visits   67 year old female with hypertension, diabetes, CHF who presents with mainly right-sided nosebleed in the setting of recent nasal and chest congestion.  Temporarily dizzy and short of breath while in the lobby awaiting treatment  room.  Denies all symptoms now.  Differential diagnosis includes but is not limited to seasonal allergies secondary to pollen, rhinitis, acute blood loss, CHF, etc.   Discussed at length with patient and her spouse to prevent nose from rebleeding.  Options discussed include silver nitrate cautery and Merocel packing.  At this time since nose is not bleeding and only minor anterior irritation noted, will apply petroleum jelly to moisturize right nasal passage.   Clinical Course as of Sep 17 402  Sun Sep 16, 2017  0402 Patient sleeping in no acute distress.  Awakened her to update her and her spouse of all test results.  There is no rebleeding from either nare.  Blood pressure has come down.  Gave patient instructions on what to do should her nose rebleed.  Will refer her to ENT for outpatient aloe up.  Strict return precautions given.  Both verbalize understanding and agree with plan of care.   [JS]    Clinical Course User Index [JS] Paulette Blanch, MD     ____________________________________________   FINAL CLINICAL IMPRESSION(S) / ED DIAGNOSES  Final diagnoses:  Right-sided epistaxis  Anterior epistaxis  Essential hypertension     ED Discharge Orders    None       Note:  This document was prepared using Dragon voice recognition software and may include unintentional dictation errors.    Paulette Blanch, MD 09/16/17 [phone]

## 2017-09-19 ENCOUNTER — Other Ambulatory Visit: Payer: Self-pay | Admitting: Internal Medicine

## 2017-09-19 DIAGNOSIS — Z1231 Encounter for screening mammogram for malignant neoplasm of breast: Secondary | ICD-10-CM

## 2017-10-16 ENCOUNTER — Ambulatory Visit
Admission: RE | Admit: 2017-10-16 | Discharge: 2017-10-16 | Disposition: A | Discharge status: Home / Self Care | Payer: Medicare Other | Source: Ambulatory Visit | Attending: Internal Medicine | Admitting: Internal Medicine

## 2017-10-16 DIAGNOSIS — Z1231 Encounter for screening mammogram for malignant neoplasm of breast: Secondary | ICD-10-CM | POA: Insufficient documentation

## 2017-10-23 ENCOUNTER — Other Ambulatory Visit: Payer: Self-pay | Admitting: Internal Medicine

## 2017-10-23 DIAGNOSIS — N6489 Other specified disorders of breast: Secondary | ICD-10-CM

## 2017-10-23 DIAGNOSIS — R928 Other abnormal and inconclusive findings on diagnostic imaging of breast: Secondary | ICD-10-CM

## 2017-10-29 ENCOUNTER — Ambulatory Visit
Admission: RE | Admit: 2017-10-29 | Discharge: 2017-10-29 | Disposition: A | Discharge status: Home / Self Care | Payer: Medicare Other | Source: Ambulatory Visit | Attending: Internal Medicine | Admitting: Internal Medicine

## 2017-10-29 DIAGNOSIS — R928 Other abnormal and inconclusive findings on diagnostic imaging of breast: Secondary | ICD-10-CM

## 2017-10-29 DIAGNOSIS — N6489 Other specified disorders of breast: Secondary | ICD-10-CM

## 2018-10-07 ENCOUNTER — Other Ambulatory Visit: Payer: Self-pay | Admitting: Internal Medicine

## 2018-10-07 DIAGNOSIS — Z1231 Encounter for screening mammogram for malignant neoplasm of breast: Secondary | ICD-10-CM

## 2018-11-01 ENCOUNTER — Ambulatory Visit
Admission: RE | Admit: 2018-11-01 | Discharge: 2018-11-01 | Disposition: A | Discharge status: Home / Self Care | Payer: Medicare Other | Source: Ambulatory Visit | Attending: Internal Medicine | Admitting: Internal Medicine

## 2018-11-01 ENCOUNTER — Other Ambulatory Visit: Payer: Self-pay

## 2018-11-01 DIAGNOSIS — Z1231 Encounter for screening mammogram for malignant neoplasm of breast: Secondary | ICD-10-CM | POA: Insufficient documentation

## 2018-11-19 ENCOUNTER — Other Ambulatory Visit: Payer: Self-pay | Admitting: Nephrology

## 2018-11-19 DIAGNOSIS — N183 Chronic kidney disease, stage 3 unspecified: Secondary | ICD-10-CM

## 2018-11-27 ENCOUNTER — Other Ambulatory Visit: Payer: Self-pay

## 2018-11-27 ENCOUNTER — Ambulatory Visit
Admission: RE | Admit: 2018-11-27 | Discharge: 2018-11-27 | Disposition: A | Discharge status: Home / Self Care | Payer: Medicare Other | Source: Ambulatory Visit | Attending: Nephrology | Admitting: Nephrology

## 2018-11-27 DIAGNOSIS — N183 Chronic kidney disease, stage 3 unspecified: Secondary | ICD-10-CM

## 2019-02-17 ENCOUNTER — Other Ambulatory Visit: Payer: Self-pay | Admitting: Internal Medicine

## 2019-02-17 DIAGNOSIS — R2689 Other abnormalities of gait and mobility: Secondary | ICD-10-CM

## 2019-02-17 DIAGNOSIS — R29898 Other symptoms and signs involving the musculoskeletal system: Secondary | ICD-10-CM

## 2019-02-17 DIAGNOSIS — E1122 Type 2 diabetes mellitus with diabetic chronic kidney disease: Secondary | ICD-10-CM

## 2019-02-17 DIAGNOSIS — N183 Chronic kidney disease, stage 3 unspecified: Secondary | ICD-10-CM

## 2019-02-17 DIAGNOSIS — R296 Repeated falls: Secondary | ICD-10-CM

## 2019-02-21 ENCOUNTER — Emergency Department
Admission: EM | Admit: 2019-02-21 | Discharge: 2019-02-21 | Disposition: A | Discharge status: Home / Self Care | Payer: Medicare Other | Attending: Emergency Medicine | Admitting: Emergency Medicine

## 2019-02-21 ENCOUNTER — Other Ambulatory Visit: Payer: Self-pay

## 2019-02-21 ENCOUNTER — Emergency Department: Payer: Medicare Other

## 2019-02-21 DIAGNOSIS — Z79899 Other long term (current) drug therapy: Secondary | ICD-10-CM | POA: Diagnosis not present

## 2019-02-21 DIAGNOSIS — M25511 Pain in right shoulder: Secondary | ICD-10-CM

## 2019-02-21 DIAGNOSIS — I110 Hypertensive heart disease with heart failure: Secondary | ICD-10-CM | POA: Diagnosis not present

## 2019-02-21 DIAGNOSIS — E119 Type 2 diabetes mellitus without complications: Secondary | ICD-10-CM | POA: Insufficient documentation

## 2019-02-21 DIAGNOSIS — Z85038 Personal history of other malignant neoplasm of large intestine: Secondary | ICD-10-CM | POA: Insufficient documentation

## 2019-02-21 DIAGNOSIS — Z7984 Long term (current) use of oral hypoglycemic drugs: Secondary | ICD-10-CM | POA: Insufficient documentation

## 2019-02-21 DIAGNOSIS — Z7982 Long term (current) use of aspirin: Secondary | ICD-10-CM | POA: Insufficient documentation

## 2019-02-21 DIAGNOSIS — Z87891 Personal history of nicotine dependence: Secondary | ICD-10-CM | POA: Insufficient documentation

## 2019-02-21 DIAGNOSIS — I509 Heart failure, unspecified: Secondary | ICD-10-CM | POA: Diagnosis not present

## 2019-02-21 DIAGNOSIS — I2510 Atherosclerotic heart disease of native coronary artery without angina pectoris: Secondary | ICD-10-CM | POA: Insufficient documentation

## 2019-02-21 DIAGNOSIS — Z794 Long term (current) use of insulin: Secondary | ICD-10-CM | POA: Insufficient documentation

## 2019-02-21 MED ORDER — TRAMADOL HCL 50 MG PO TABS: 50.0000 mg | ORAL_TABLET | Freq: Four times a day (QID) | ORAL | 0 refills | Status: AC | PRN

## 2019-02-21 MED ORDER — LIDOCAINE 5 % EX PTCH: 1.0000 | MEDICATED_PATCH | Freq: Two times a day (BID) | CUTANEOUS | 0 refills | Status: DC

## 2019-02-21 NOTE — ED Notes (Signed)
First Nurse Note: Pt c/o right shoulder pain for the past few weeks. Pt denies known injury. Pt is in NAD

## 2019-02-21 NOTE — ED Provider Notes (Signed)
[facility] Emergency Department Provider Note  ____________________________________________  Time seen: Approximately 3:57 PM  I have reviewed the triage vital signs and the nursing notes.   HISTORY  Chief Complaint Shoulder Pain    HPI Priscilla Villanueva is a 68 y.o. female with a history of colon cancer, diabetes, hyperlipidemia and hypertension, presents to the emergency department with acute right shoulder pain.  Patient reports that pain is localized along superior and lateral aspect of her right shoulder.  Pain does not radiate.  Pain does not occur with range of motion of the neck.  Patient reports that she had a mechanical, non-syncopal fall approximately 10 days ago and states that her shoulder has been sore since.  She has a history of prior left-sided rotator cuff tear that was repaired surgically.  Patient had recent labs taken by her primary care provider and has a MRI of her brain scheduled for Tuesday as she has had increased falls over the past several months.  Patient denies current chest pain, chest tightness, shortness of breath or abdominal pain.  No current headache.        Past Medical History:  Diagnosis Date  . CAD (coronary artery disease)   . CHF (congestive heart failure) ([place])   . Colon cancer ([place]) 2009   with chemotherapy  . Colon cancer ([place]) 11/26/2014  . Depression   . Diabetes mellitus without complication ([place])   . Hypercholesteremia   . Hypertension   . Obesity   . Personal history of chemotherapy 2009   colon ca    Patient Active Problem List   Diagnosis Date Noted  . SOB (shortness of breath) on exertion 12/22/2014  . Colon cancer ([place]) 11/26/2014  . CCF (congestive cardiac failure) ([place]) 10/01/2014  . Diabetes mellitus, type 2 ([place]) 10/01/2014  . Arteriosclerosis of coronary artery 10/01/2014  . BP (high blood pressure) 10/01/2014  . Diabetes ([place]) 12/10/2013  . Adiposity 12/10/2013    Past Surgical History:   Procedure Laterality Date  . ABDOMINAL HYSTERECTOMY     PARTIAL  . COLECTOMY    . COLONOSCOPY WITH PROPOFOL N/A 07/16/2017   Procedure: COLONOSCOPY WITH PROPOFOL;  Surgeon: Lollie Sails, MD;  Location: [facility] ENDOSCOPY;  Service: Endoscopy;  Laterality: N/A;  . PERIPHERAL VASCULAR CATHETERIZATION N/A 01/26/2015   Procedure: Glori Luis Cath Removal;  Surgeon: Katha Cabal, MD;  Location: [place] CV LAB;  Service: Cardiovascular;  Laterality: N/A;  . UPPER GI ENDOSCOPY      Prior to Admission medications   Medication Sig Start Date End Date Taking? Authorizing Provider  allopurinol (ZYLOPRIM) 100 MG tablet Take 100 mg by mouth 2 (two) times daily.     [provider]  ALPRAZolam Duanne Moron) 0.25 MG tablet Take 0.25 mg by mouth at bedtime as needed for anxiety.    [provider]  aspirin 81 MG EC tablet Take 81 mg by mouth daily. Swallow whole.    [provider]  b complex vitamins capsule Take 1 capsule by mouth daily.    [provider]  cloNIDine (CATAPRES) 0.3 MG tablet 0.3 mg 3 (three) times daily.  11/24/14   [provider]  colchicine 0.6 MG tablet Take 2 tablets by mouth as needed. 08/28/14   [provider]  cyclobenzaprine (FLEXERIL) 10 MG tablet Take 1 tablet (10 mg total) by mouth 3 (three) times daily as needed. 06/06/17   Sable Feil, PA-C  ferrous sulfate 325 (65 FE) MG EC tablet Take  325 mg by mouth every morning. Reported on 06/11/2015    [provider]  furosemide (LASIX) 40 MG tablet Take 40 mg by mouth 2 (two) times daily. 09/28/14   [provider]  HUMALOG MIX 75/25 KWIKPEN (75-25) 100 UNIT/ML Claiborne Rigg  11/04/14   [provider]  hydrALAZINE (APRESOLINE) 50 MG tablet Take 100 mg by mouth 3 (three) times daily.     [provider]  insulin lispro (HUMALOG) 100 UNIT/ML injection Inject 18 Units into the skin 2 (two) times daily before a meal. Reported on 06/11/2015    [provider]  lidocaine (LIDODERM) 5 % Place 1 patch onto the skin every 12 (twelve) hours. Remove & Discard patch within 12 hours or as directed by MD 02/21/19 02/21/20  Lannie Fields, PA-C  liraglutide (VICTOZA) 18 MG/3ML SOPN Inject 0.6 mg into the skin.    [provider]  Magnesium 250 MG TABS Take 2 tablets by mouth 2 (two) times daily.    [provider]  magnesium oxide (MAG-OX) 400 MG tablet Take 400 mg by mouth daily.    [provider]  metFORMIN (GLUCOPHAGE) 1000 MG tablet Take 1,000 mg by mouth 2 (two) times daily with a meal.    [provider]  metoprolol (TOPROL-XL) 200 MG 24 hr tablet Take 200 mg by mouth daily.    [provider]  olmesartan (BENICAR) 40 MG tablet Take 40 mg by mouth daily.    [provider]  polyethylene glycol (MIRALAX / GLYCOLAX) packet Take 17 g by mouth daily.    [provider]  potassium chloride (K-DUR) 10 MEQ tablet TAKE ONE TABLET BY MOUTH TWICE DAILY 12/30/14   Herring, Orville Govern, NP  pravastatin (PRAVACHOL) 20 MG tablet Take 20 mg by mouth daily.    [provider]  sertraline (ZOLOFT) 50 MG tablet Take 50 mg by mouth daily.    [provider]  spironolactone (ALDACTONE) 25 MG tablet Take 25 mg by mouth daily.    [provider]  torsemide (DEMADEX) 20 MG tablet Take 20 mg by mouth daily.    [provider]  traMADol (ULTRAM) 50 MG tablet Take 1 tablet (50 mg total) by mouth every 6 (six) hours as needed for up to 3 days. 02/21/19 02/24/19  Lannie Fields, PA-C    Allergies Amlodipine, Hydrochlorothiazide, Lisinopril, Sitagliptin, and Zoloft [sertraline]  Family History  Problem Relation Age of Onset  . Colon cancer Mother        "had some type of tumor in the vulva liver unknown primary"  . Colon cancer Brother        "died at young age"  . Breast cancer Neg Hx     Social History Social History   Tobacco Use  . Smoking status: Former Smoker     Packs/day: 1.00    Years: 3.00    Pack years: 3.00  . Smokeless tobacco: Never Used  Substance Use Topics  . Alcohol use: Yes    Alcohol/week: 1.0 standard drinks    Types: 1 Cans of beer per week    Comment: socially beer  . Drug use: No     Review of Systems  Constitutional: No fever/chills Eyes: No visual changes. No discharge ENT: No upper respiratory complaints. Cardiovascular: no chest pain. Respiratory: no cough. No SOB. Gastrointestinal: No abdominal pain.  No nausea, no vomiting.  No diarrhea.  No constipation. Genitourinary: Negative for dysuria. No hematuria Musculoskeletal: Patient has right  shoulder pain. Skin: Negative for rash, abrasions, lacerations, ecchymosis. Neurological: Negative for headaches, focal weakness or numbness.   ____________________________________________   PHYSICAL EXAM:  VITAL SIGNS: ED Triage Vitals  Enc Vitals Group     BP 02/21/19 1515 131/78     Pulse Rate 02/21/19 1515 86     Resp 02/21/19 1515 20     Temp 02/21/19 1515 97.6 F (36.4 C)     Temp Source 02/21/19 1515 Oral     SpO2 02/21/19 1515 97 %     Weight 02/21/19 1516 230 lb (104.3 kg)     Height 02/21/19 1516 5\' 4"  (1.626 m)     Head Circumference --      Peak Flow --      Pain Score 02/21/19 1515 9     Pain Loc --      Pain Edu? --      Excl. in [place]? --      Constitutional: Alert and oriented. Well appearing and in no acute distress. Eyes: Conjunctivae are normal. PERRL. EOMI. Head: Atraumatic. Cardiovascular: Normal rate, regular rhythm. Normal S1 and S2.  Good peripheral circulation. Respiratory: Normal respiratory effort without tachypnea or retractions. Lungs CTAB. Good air entry to the bases with no decreased or absent breath sounds. Gastrointestinal: Bowel sounds 4 quadrants. Soft and nontender to palpation. No guarding or rigidity. No palpable masses. No distention. No CVA tenderness. Musculoskeletal: Patient has 5 out of 5 strength in the upper  extremities bilaterally and symmetrically.  Patient can perform abduction to 90 degrees at right shoulder.  She has pain with right rotator cuff testing but no weakness.  She has mild tenderness to palpation over the right AC joint.  Performs full range of motion of the right wrist and right elbow.  Palpable radial pulse, right.  Capillary refill less than 2 seconds of all 5 right digits. Neurologic:  Normal speech and language. No gross focal neurologic deficits are appreciated.  Skin:  Skin is warm, dry and intact. No rash noted. Psychiatric: Mood and affect are normal. Speech and behavior are normal. Patient exhibits appropriate insight and judgement.   ____________________________________________   LABS (all labs ordered are listed, but only abnormal results are displayed)  Labs Reviewed - No data to display ____________________________________________  EKG   ____________________________________________  RADIOLOGY I personally viewed and evaluated these images as part of my medical decision making, as well as reviewing the written report by the radiologist.    Dg Shoulder Right  Result Date: 02/21/2019 CLINICAL DATA:  Right shoulder pain EXAM: RIGHT SHOULDER - 2+ VIEW COMPARISON:  None. FINDINGS: No fracture or dislocation of the right shoulder. Mild acromioclavicular and glenohumeral arthrosis. No radiographic abnormality of the partially included right chest. IMPRESSION: No fracture or dislocation of the right shoulder. Mild acromioclavicular and glenohumeral arthrosis. Electronically Signed   By: Eddie Candle M.D.   On: 02/21/2019 16:48    ____________________________________________    PROCEDURES  Procedure(s) performed:    Procedures    Medications - No data to display   ____________________________________________   INITIAL IMPRESSION / ASSESSMENT AND PLAN / ED COURSE  Pertinent labs & imaging results that were available during my care of the patient were  reviewed by me and considered in my medical decision making (see chart for details).  Review of the [place] CSRS was performed in accordance of the [place] prior to dispensing any controlled drugs.           Assessment and plan Fall 68 year old  female presents to the emergency department after a mechanical, non-syncopal fall that occurred 10 days ago.  Patient reports that she has had persistent right shoulder pain since fall occurred.  Vital signs were reassuring at triage.  On physical exam, right shoulder appears nonedematous.  She has mild tenderness to palpation over the right AC joint.  She was able to perform abduction at the right shoulder to 90 degrees and had pain with right rotator cuff testing but no weakness.  Differential diagnosis includes rotator cuff strain/tear, fracture, AC separation, labral tear...  X-ray examination of the right shoulder reveals no bony abnormality.  Patient declined a sling offered in the emergency department.  Patient does not tolerate anti-inflammatories.  She was discharged with a short course of tramadol and lidocaine patches.  She was given a referral to orthopedics, Dr. Roland Rack.   Advised patient to keep appointment on Tuesday for MRI of the brain.  Patient has good follow-up with her primary care provider.  Return precautions were given.  All patient questions were answered.    ____________________________________________  FINAL CLINICAL IMPRESSION(S) / ED DIAGNOSES  Final diagnoses:  Acute pain of right shoulder      NEW MEDICATIONS STARTED DURING THIS VISIT:  ED Discharge Orders         Ordered    traMADol (ULTRAM) 50 MG tablet  Every 6 hours PRN     02/21/19 1700    lidocaine (LIDODERM) 5 %  Every 12 hours     02/21/19 1700              This chart was dictated using voice recognition software/Dragon. Despite best efforts to proofread, errors can occur which can change the meaning. Any change was purely unintentional.     Lannie Fields, PA-C 02/21/19 1715    Carrie Mew, MD 02/21/19 2024

## 2019-02-21 NOTE — Discharge Instructions (Signed)
Today you were seen and evaluated for your right shoulder pain. You had x-rays done of your right shoulder.  Joint spacing is well-preserved and there were no fractures or bony lesions identified on your films. You have been prescribed tramadol for right shoulder pain and lidocaine patches. You have been given a referral to orthopedics, Dr. Roland Rack.  Please call phone number included in this discharge paperwork for follow-up appointment. You can apply ice for approximately 15 minutes for every hour while sitting and relaxing.

## 2019-02-21 NOTE — ED Triage Notes (Signed)
Pt states that she fell a week and a half ago and hit her right shoulder and states that it cont to be painful, pt reports that she has an upcoming ct of the brain on Tuesday to determine the cause of her falls. Pt states that the pain is worse when she tries to lift her arm, states that she had a tear in the rotator cuff of the left shoulder and it reminds her of that

## 2019-02-21 NOTE — ED Notes (Signed)
See triage note  States she had a fall about 1 -2 weeks ago  conts to have shoulder pain

## 2019-02-25 ENCOUNTER — Ambulatory Visit
Admission: RE | Admit: 2019-02-25 | Discharge: 2019-02-25 | Disposition: A | Discharge status: Home / Self Care | Payer: Medicare Other | Source: Ambulatory Visit | Attending: Internal Medicine | Admitting: Internal Medicine

## 2019-02-25 ENCOUNTER — Other Ambulatory Visit: Payer: Self-pay

## 2019-02-25 DIAGNOSIS — N183 Chronic kidney disease, stage 3 unspecified: Secondary | ICD-10-CM

## 2019-02-25 DIAGNOSIS — R29898 Other symptoms and signs involving the musculoskeletal system: Secondary | ICD-10-CM | POA: Diagnosis present

## 2019-02-25 DIAGNOSIS — E1122 Type 2 diabetes mellitus with diabetic chronic kidney disease: Secondary | ICD-10-CM

## 2019-02-25 DIAGNOSIS — R2689 Other abnormalities of gait and mobility: Secondary | ICD-10-CM | POA: Diagnosis present

## 2019-02-25 DIAGNOSIS — Z794 Long term (current) use of insulin: Secondary | ICD-10-CM | POA: Insufficient documentation

## 2019-02-25 DIAGNOSIS — R296 Repeated falls: Secondary | ICD-10-CM

## 2019-03-04 ENCOUNTER — Other Ambulatory Visit: Payer: Self-pay | Admitting: Student

## 2019-03-04 DIAGNOSIS — S46011A Strain of muscle(s) and tendon(s) of the rotator cuff of right shoulder, initial encounter: Secondary | ICD-10-CM

## 2019-03-11 ENCOUNTER — Encounter: Payer: Self-pay | Admitting: Emergency Medicine

## 2019-03-11 ENCOUNTER — Ambulatory Visit: Payer: Medicare Other

## 2019-03-11 ENCOUNTER — Inpatient Hospital Stay: Payer: Medicare Other

## 2019-03-11 ENCOUNTER — Inpatient Hospital Stay
Admission: EM | Admit: 2019-03-11 | Discharge: 2019-03-13 | DRG: 291 | Disposition: A | Discharge status: Home / Self Care | Payer: Medicare Other | Attending: Internal Medicine | Admitting: Internal Medicine

## 2019-03-11 ENCOUNTER — Emergency Department: Payer: Medicare Other

## 2019-03-11 ENCOUNTER — Inpatient Hospital Stay: Admit: 2019-03-11 | Payer: Medicare Other

## 2019-03-11 ENCOUNTER — Other Ambulatory Visit: Payer: Self-pay

## 2019-03-11 ENCOUNTER — Inpatient Hospital Stay
Admit: 2019-03-11 | Discharge: 2019-03-11 | Disposition: A | Discharge status: Home / Self Care | Payer: Medicare Other | Attending: Family Medicine | Admitting: Family Medicine

## 2019-03-11 DIAGNOSIS — Z9221 Personal history of antineoplastic chemotherapy: Secondary | ICD-10-CM

## 2019-03-11 DIAGNOSIS — I2510 Atherosclerotic heart disease of native coronary artery without angina pectoris: Secondary | ICD-10-CM | POA: Diagnosis present

## 2019-03-11 DIAGNOSIS — R0602 Shortness of breath: Secondary | ICD-10-CM

## 2019-03-11 DIAGNOSIS — J9600 Acute respiratory failure, unspecified whether with hypoxia or hypercapnia: Secondary | ICD-10-CM | POA: Diagnosis present

## 2019-03-11 DIAGNOSIS — Z20828 Contact with and (suspected) exposure to other viral communicable diseases: Secondary | ICD-10-CM | POA: Diagnosis present

## 2019-03-11 DIAGNOSIS — M25511 Pain in right shoulder: Secondary | ICD-10-CM | POA: Diagnosis present

## 2019-03-11 DIAGNOSIS — Z794 Long term (current) use of insulin: Secondary | ICD-10-CM | POA: Diagnosis not present

## 2019-03-11 DIAGNOSIS — R296 Repeated falls: Secondary | ICD-10-CM | POA: Diagnosis present

## 2019-03-11 DIAGNOSIS — N179 Acute kidney failure, unspecified: Secondary | ICD-10-CM | POA: Diagnosis present

## 2019-03-11 DIAGNOSIS — N183 Chronic kidney disease, stage 3 unspecified: Secondary | ICD-10-CM | POA: Diagnosis present

## 2019-03-11 DIAGNOSIS — E875 Hyperkalemia: Secondary | ICD-10-CM | POA: Diagnosis present

## 2019-03-11 DIAGNOSIS — E669 Obesity, unspecified: Secondary | ICD-10-CM | POA: Diagnosis present

## 2019-03-11 DIAGNOSIS — E269 Hyperaldosteronism, unspecified: Secondary | ICD-10-CM | POA: Diagnosis present

## 2019-03-11 DIAGNOSIS — M109 Gout, unspecified: Secondary | ICD-10-CM | POA: Diagnosis present

## 2019-03-11 DIAGNOSIS — F329 Major depressive disorder, single episode, unspecified: Secondary | ICD-10-CM | POA: Diagnosis present

## 2019-03-11 DIAGNOSIS — Z800 Family history of malignant neoplasm of digestive organs: Secondary | ICD-10-CM

## 2019-03-11 DIAGNOSIS — G8929 Other chronic pain: Secondary | ICD-10-CM | POA: Diagnosis present

## 2019-03-11 DIAGNOSIS — F411 Generalized anxiety disorder: Secondary | ICD-10-CM | POA: Diagnosis present

## 2019-03-11 DIAGNOSIS — I509 Heart failure, unspecified: Secondary | ICD-10-CM

## 2019-03-11 DIAGNOSIS — E1122 Type 2 diabetes mellitus with diabetic chronic kidney disease: Secondary | ICD-10-CM | POA: Diagnosis present

## 2019-03-11 DIAGNOSIS — Z6841 Body Mass Index (BMI) 40.0 and over, adult: Secondary | ICD-10-CM | POA: Diagnosis not present

## 2019-03-11 DIAGNOSIS — Z87891 Personal history of nicotine dependence: Secondary | ICD-10-CM | POA: Diagnosis not present

## 2019-03-11 DIAGNOSIS — Z8673 Personal history of transient ischemic attack (TIA), and cerebral infarction without residual deficits: Secondary | ICD-10-CM

## 2019-03-11 DIAGNOSIS — I5033 Acute on chronic diastolic (congestive) heart failure: Secondary | ICD-10-CM | POA: Diagnosis present

## 2019-03-11 DIAGNOSIS — I130 Hypertensive heart and chronic kidney disease with heart failure and stage 1 through stage 4 chronic kidney disease, or unspecified chronic kidney disease: Secondary | ICD-10-CM | POA: Diagnosis not present

## 2019-03-11 DIAGNOSIS — Z85038 Personal history of other malignant neoplasm of large intestine: Secondary | ICD-10-CM

## 2019-03-11 DIAGNOSIS — E785 Hyperlipidemia, unspecified: Secondary | ICD-10-CM | POA: Diagnosis present

## 2019-03-11 DIAGNOSIS — R42 Dizziness and giddiness: Secondary | ICD-10-CM | POA: Diagnosis present

## 2019-03-11 DIAGNOSIS — M7989 Other specified soft tissue disorders: Secondary | ICD-10-CM

## 2019-03-11 DIAGNOSIS — R778 Other specified abnormalities of plasma proteins: Secondary | ICD-10-CM

## 2019-03-11 LAB — CBC WITH DIFFERENTIAL/PLATELET
Abs Immature Granulocytes: 0.01 10*3/uL (ref 0.00–0.07)
Basophils Absolute: 0 10*3/uL (ref 0.0–0.1)
Basophils Relative: 1 %
Eosinophils Absolute: 0.1 10*3/uL (ref 0.0–0.5)
Eosinophils Relative: 1 %
HCT: 42.5 % (ref 36.0–46.0)
Hemoglobin: 12.7 g/dL (ref 12.0–15.0)
Immature Granulocytes: 0 %
Lymphocytes Relative: 26 %
Lymphs Abs: 1.1 10*3/uL (ref 0.7–4.0)
MCH: 25 pg — ABNORMAL LOW (ref 26.0–34.0)
MCHC: 29.9 g/dL — ABNORMAL LOW (ref 30.0–36.0)
MCV: 83.7 fL (ref 80.0–100.0)
Monocytes Absolute: 0.4 10*3/uL (ref 0.1–1.0)
Monocytes Relative: 9 %
Neutro Abs: 2.7 10*3/uL (ref 1.7–7.7)
Neutrophils Relative %: 63 %
Platelets: 195 10*3/uL (ref 150–400)
RBC: 5.08 MIL/uL (ref 3.87–5.11)
RDW: 18.7 % — ABNORMAL HIGH (ref 11.5–15.5)
WBC: 4.3 10*3/uL (ref 4.0–10.5)
nRBC: 0 % (ref 0.0–0.2)

## 2019-03-11 LAB — COMPREHENSIVE METABOLIC PANEL
ALT: 37 U/L (ref 0–44)
AST: 43 U/L — ABNORMAL HIGH (ref 15–41)
Albumin: 3.1 g/dL — ABNORMAL LOW (ref 3.5–5.0)
Alkaline Phosphatase: 110 U/L (ref 38–126)
Anion gap: 12 (ref 5–15)
BUN: 58 mg/dL — ABNORMAL HIGH (ref 8–23)
CO2: 23 mmol/L (ref 22–32)
Calcium: 8.5 mg/dL — ABNORMAL LOW (ref 8.9–10.3)
Chloride: 106 mmol/L (ref 98–111)
Creatinine, Ser: 2.87 mg/dL — ABNORMAL HIGH (ref 0.44–1.00)
GFR calc Af Amer: 19 mL/min — ABNORMAL LOW (ref >60)
GFR calc non Af Amer: 16 mL/min — ABNORMAL LOW (ref >60)
Glucose, Bld: 260 mg/dL — ABNORMAL HIGH (ref 70–99)
Potassium: 5.1 mmol/L (ref 3.5–5.1)
Sodium: 141 mmol/L (ref 135–145)
Total Bilirubin: 0.7 mg/dL (ref 0.3–1.2)
Total Protein: 5.7 g/dL — ABNORMAL LOW (ref 6.5–8.1)

## 2019-03-11 LAB — GLUCOSE, CAPILLARY
Glucose-Capillary: 189 mg/dL — ABNORMAL HIGH (ref 70–99)
Glucose-Capillary: 215 mg/dL — ABNORMAL HIGH (ref 70–99)
Glucose-Capillary: 177 mg/dL — ABNORMAL HIGH (ref 70–99)
Glucose-Capillary: 206 mg/dL — ABNORMAL HIGH (ref 70–99)

## 2019-03-11 LAB — TROPONIN I (HIGH SENSITIVITY)
Troponin I (High Sensitivity): 29 ng/L — ABNORMAL HIGH (ref <18)
Troponin I (High Sensitivity): 26 ng/L — ABNORMAL HIGH (ref <18)

## 2019-03-11 LAB — BRAIN NATRIURETIC PEPTIDE: B Natriuretic Peptide: 1048 pg/mL — ABNORMAL HIGH (ref 0.0–100.0)

## 2019-03-11 LAB — ECHOCARDIOGRAM COMPLETE

## 2019-03-11 LAB — HIV ANTIBODY (ROUTINE TESTING W REFLEX): HIV Screen 4th Generation wRfx: NONREACTIVE

## 2019-03-11 LAB — MAGNESIUM: Magnesium: 2 mg/dL (ref 1.7–2.4)

## 2019-03-11 LAB — HEMOGLOBIN A1C
Hgb A1c MFr Bld: 7.2 % — ABNORMAL HIGH (ref 4.8–5.6)
Mean Plasma Glucose: 159.94 mg/dL

## 2019-03-11 LAB — SARS CORONAVIRUS 2 (TAT 6-24 HRS): SARS Coronavirus 2: NEGATIVE

## 2019-03-11 MED ORDER — PRAVASTATIN SODIUM 20 MG PO TABS
20.0000 mg | ORAL_TABLET | Freq: Every day | ORAL | Status: DC
  Administered 2019-03-12: 20 mg via ORAL
  Filled 2019-03-11 (×3): qty 1

## 2019-03-11 MED ORDER — ASPIRIN EC 81 MG PO TBEC
81.0000 mg | DELAYED_RELEASE_TABLET | Freq: Every day | ORAL | Status: DC
  Administered 2019-03-11 – 2019-03-13 (×3): 81 mg via ORAL
  Filled 2019-03-11 (×4): qty 1

## 2019-03-11 MED ORDER — B COMPLEX-C PO TABS
1.0000 | ORAL_TABLET | Freq: Every day | ORAL | Status: DC
  Administered 2019-03-12 – 2019-03-13 (×2): 1 via ORAL
  Filled 2019-03-11 (×2): qty 1

## 2019-03-11 MED ORDER — CYCLOBENZAPRINE HCL 10 MG PO TABS: 10.0000 mg | ORAL_TABLET | Freq: Three times a day (TID) | ORAL | Status: DC | PRN

## 2019-03-11 MED ORDER — COLCHICINE 0.6 MG PO TABS
1.2000 mg | ORAL_TABLET | Freq: Two times a day (BID) | ORAL | Status: DC
  Administered 2019-03-11: 12:00:00 1.2 mg via ORAL
  Filled 2019-03-11 (×4): qty 2

## 2019-03-11 MED ORDER — ZOLPIDEM TARTRATE 5 MG PO TABS
5.0000 mg | ORAL_TABLET | Freq: Every evening | ORAL | Status: DC | PRN
  Filled 2019-03-11: qty 1

## 2019-03-11 MED ORDER — SODIUM CHLORIDE 0.9% FLUSH: 3.0000 mL | INTRAVENOUS | Status: DC | PRN

## 2019-03-11 MED ORDER — SODIUM CHLORIDE 0.9% FLUSH
3.0000 mL | Freq: Two times a day (BID) | INTRAVENOUS | Status: DC
  Administered 2019-03-11 – 2019-03-13 (×5): 3 mL via INTRAVENOUS

## 2019-03-11 MED ORDER — ALLOPURINOL 100 MG PO TABS
100.0000 mg | ORAL_TABLET | Freq: Two times a day (BID) | ORAL | Status: DC
  Administered 2019-03-11 – 2019-03-13 (×4): 100 mg via ORAL
  Filled 2019-03-11 (×7): qty 1

## 2019-03-11 MED ORDER — FUROSEMIDE 10 MG/ML IJ SOLN
40.0000 mg | Freq: Two times a day (BID) | INTRAMUSCULAR | Status: DC
  Administered 2019-03-11 – 2019-03-13 (×5): 40 mg via INTRAVENOUS
  Filled 2019-03-11 (×6): qty 4

## 2019-03-11 MED ORDER — MAGNESIUM 250 MG PO TABS: 2.0000 | ORAL_TABLET | Freq: Two times a day (BID) | ORAL | Status: DC

## 2019-03-11 MED ORDER — ALPRAZOLAM 0.25 MG PO TABS
0.2500 mg | ORAL_TABLET | Freq: Every evening | ORAL | Status: DC | PRN
  Administered 2019-03-11: 0.25 mg via ORAL
  Filled 2019-03-11: qty 1

## 2019-03-11 MED ORDER — ONDANSETRON HCL 4 MG/2ML IJ SOLN: 4.0000 mg | Freq: Four times a day (QID) | INTRAMUSCULAR | Status: DC | PRN

## 2019-03-11 MED ORDER — INSULIN ASPART 100 UNIT/ML ~~LOC~~ SOLN
0.0000 [IU] | Freq: Three times a day (TID) | SUBCUTANEOUS | Status: DC
  Administered 2019-03-11: 09:00:00 3 [IU] via SUBCUTANEOUS
  Administered 2019-03-11: 5 [IU] via SUBCUTANEOUS
  Administered 2019-03-11 – 2019-03-13 (×6): 3 [IU] via SUBCUTANEOUS
  Filled 2019-03-11 (×8): qty 1

## 2019-03-11 MED ORDER — HYDRALAZINE HCL 50 MG PO TABS
100.0000 mg | ORAL_TABLET | Freq: Three times a day (TID) | ORAL | Status: DC
  Administered 2019-03-11 – 2019-03-13 (×6): 100 mg via ORAL
  Filled 2019-03-11 (×9): qty 2

## 2019-03-11 MED ORDER — POLYETHYLENE GLYCOL 3350 17 G PO PACK
17.0000 g | PACK | Freq: Every day | ORAL | Status: DC
  Filled 2019-03-11 (×4): qty 1

## 2019-03-11 MED ORDER — METOPROLOL SUCCINATE ER 100 MG PO TB24
200.0000 mg | ORAL_TABLET | Freq: Every day | ORAL | Status: DC
  Administered 2019-03-11 – 2019-03-13 (×3): 200 mg via ORAL
  Filled 2019-03-11: qty 8
  Filled 2019-03-11 (×3): qty 2

## 2019-03-11 MED ORDER — HEPARIN SODIUM (PORCINE) 5000 UNIT/ML IJ SOLN
5000.0000 [IU] | Freq: Three times a day (TID) | INTRAMUSCULAR | Status: DC
  Administered 2019-03-11 – 2019-03-13 (×7): 5000 [IU] via SUBCUTANEOUS
  Filled 2019-03-11 (×8): qty 1

## 2019-03-11 MED ORDER — ASPIRIN 81 MG PO TBEC: 81.0000 mg | DELAYED_RELEASE_TABLET | Freq: Every day | ORAL | Status: DC

## 2019-03-11 MED ORDER — CHLORHEXIDINE GLUCONATE CLOTH 2 % EX PADS: 6.0000 | MEDICATED_PAD | Freq: Every day | CUTANEOUS | Status: DC

## 2019-03-11 MED ORDER — LIRAGLUTIDE 18 MG/3ML ~~LOC~~ SOPN: 0.6000 mg | PEN_INJECTOR | Freq: Every morning | SUBCUTANEOUS | Status: DC

## 2019-03-11 MED ORDER — LIDOCAINE 5 % EX PTCH
1.0000 | MEDICATED_PATCH | Freq: Two times a day (BID) | CUTANEOUS | Status: DC
  Administered 2019-03-11 – 2019-03-12 (×2): 1 via TRANSDERMAL
  Filled 2019-03-11 (×6): qty 1

## 2019-03-11 MED ORDER — TECHNETIUM TO 99M ALBUMIN AGGREGATED
4.0000 | Freq: Once | INTRAVENOUS | Status: AC | PRN
  Administered 2019-03-11: 4.32 via INTRAVENOUS
  Filled 2019-03-11: qty 4

## 2019-03-11 MED ORDER — MAGNESIUM OXIDE 400 (241.3 MG) MG PO TABS
400.0000 mg | ORAL_TABLET | Freq: Every day | ORAL | Status: DC
  Administered 2019-03-11 – 2019-03-13 (×3): 400 mg via ORAL
  Filled 2019-03-11 (×4): qty 1

## 2019-03-11 MED ORDER — SODIUM CHLORIDE 0.9 % IV SOLN: 250.0000 mL | INTRAVENOUS | Status: DC | PRN

## 2019-03-11 MED ORDER — CLONIDINE HCL 0.1 MG PO TABS
0.3000 mg | ORAL_TABLET | Freq: Three times a day (TID) | ORAL | Status: DC
  Administered 2019-03-11 – 2019-03-13 (×6): 0.3 mg via ORAL
  Filled 2019-03-11 (×7): qty 3

## 2019-03-11 MED ORDER — ACETAMINOPHEN 325 MG PO TABS: 650.0000 mg | ORAL_TABLET | ORAL | Status: DC | PRN

## 2019-03-11 MED ORDER — TECHNETIUM TC 99M DIETHYLENETRIAME-PENTAACETIC ACID
30.0000 | Freq: Once | INTRAVENOUS | Status: AC | PRN
  Administered 2019-03-11: 31.28 via INTRAVENOUS
  Filled 2019-03-11: qty 30

## 2019-03-11 NOTE — Progress Notes (Signed)
Unable to obtain ReDs vest reading - BMI of 42 exceeds limit

## 2019-03-11 NOTE — ED Provider Notes (Signed)
[facility] Emergency Department Provider Note  ____________________________________________   First MD Initiated Contact with Patient 03/11/19 [phone]     (approximate)  I have reviewed the triage vital signs and the nursing notes.   HISTORY  Chief Complaint Shortness of Breath and Leg Swelling    HPI Priscilla Villanueva is a 68 y.o. female CAD, CHF, Colon cancer, depression, Diabetes, HTN who presents with shortness of breath.  Patient said about 1 month ago she was changed from 40 mg of torsemide to 20 mg of torsemide due to some kidney issues.  Over the past 1 week she has gained 10 to 15 pounds.  She is not increase her torsemide and due to the prior issues.  She then developed some shortness of breath today with try to get her bed that was moderate, constant, occurred a few hours ago, nothing makes it better, worse with exertion.  Denies any fevers, cough, chest pain.  No prior history of PE.  Her colon cancer is in remission.  No unilateral leg swelling, recent travel, recent surgery.          Past Medical History:  Diagnosis Date  . CAD (coronary artery disease)   . CHF (congestive heart failure) ([place])   . Colon cancer ([place]) 2009   with chemotherapy  . Colon cancer ([place]) 11/26/2014  . Depression   . Diabetes mellitus without complication ([place])   . Hypercholesteremia   . Hypertension   . Obesity   . Personal history of chemotherapy 2009   colon ca    Patient Active Problem List   Diagnosis Date Noted  . SOB (shortness of breath) on exertion 12/22/2014  . Colon cancer ([place]) 11/26/2014  . CCF (congestive cardiac failure) ([place]) 10/01/2014  . Diabetes mellitus, type 2 ([place]) 10/01/2014  . Arteriosclerosis of coronary artery 10/01/2014  . BP (high blood pressure) 10/01/2014  . Diabetes ([place]) 12/10/2013  . Adiposity 12/10/2013    Past Surgical History:  Procedure Laterality Date  . ABDOMINAL HYSTERECTOMY     PARTIAL  . COLECTOMY    .  COLONOSCOPY WITH PROPOFOL N/A 07/16/2017   Procedure: COLONOSCOPY WITH PROPOFOL;  Surgeon: Lollie Sails, MD;  Location[facility] END[facility]COPY;  Service: Endoscopy;  Laterality: N/A;  . PERIPHERAL VASCULAR CATHETERIZATION N/A 01/26/2015   Procedure: Glori Luis Cath Removal;  Surgeon: Katha Cabal, MD;  Location: [place] CV LAB;  Service: Cardiovascular;  Laterality: N/A;  . UPPER GI ENDOSCOPY      Prior to Admission medications   Medication Sig Start Date End Date Taking? Authorizing Provider  allopurinol (ZYLOPRIM) 100 MG tablet Take 100 mg by mouth 2 (two) times daily.     [provider]  ALPRAZolam Duanne Moron) 0.25 MG tablet Take 0.25 mg by mouth at bedtime as needed for anxiety.    [provider]  aspirin 81 MG EC tablet Take 81 mg by mouth daily. Swallow whole.    [provider]  b complex vitamins capsule Take 1 capsule by mouth daily.    [provider]  cloNIDine (CATAPRES) 0.3 MG tablet 0.3 mg 3 (three) times daily.  11/24/14   [provider]  colchicine 0.6 MG tablet Take 2 tablets by mouth as needed. 08/28/14   [provider]  cyclobenzaprine (FLEXERIL) 10 MG tablet Take 1 tablet (10 mg total) by mouth 3 (three) times daily as needed. 06/06/17   Sable Feil, PA-C  ferrous sulfate 325 (65 FE) MG EC tablet Take 325  mg by mouth every morning. Reported on 06/11/2015    [provider]  furosemide (LASIX) 40 MG tablet Take 40 mg by mouth 2 (two) times daily. 09/28/14   [provider]  HUMALOG MIX 75/25 KWIKPEN (75-25) 100 UNIT/ML Claiborne Rigg  11/04/14   [provider]  hydrALAZINE (APRESOLINE) 50 MG tablet Take 100 mg by mouth 3 (three) times daily.     [provider]  insulin lispro (HUMALOG) 100 UNIT/ML injection Inject 18 Units into the skin 2 (two) times daily before a meal. Reported on 06/11/2015    [provider]  lidocaine (LIDODERM) 5 % Place 1 patch onto the skin every 12 (twelve) hours.  Remove & Discard patch within 12 hours or as directed by MD 02/21/19 02/21/20  Lannie Fields, PA-C  liraglutide (VICTOZA) 18 MG/3ML SOPN Inject 0.6 mg into the skin.    [provider]  Magnesium 250 MG TABS Take 2 tablets by mouth 2 (two) times daily.    [provider]  magnesium oxide (MAG-OX) 400 MG tablet Take 400 mg by mouth daily.    [provider]  metFORMIN (GLUCOPHAGE) 1000 MG tablet Take 1,000 mg by mouth 2 (two) times daily with a meal.    [provider]  metoprolol (TOPROL-XL) 200 MG 24 hr tablet Take 200 mg by mouth daily.    [provider]  olmesartan (BENICAR) 40 MG tablet Take 40 mg by mouth daily.    [provider]  polyethylene glycol (MIRALAX / GLYCOLAX) packet Take 17 g by mouth daily.    [provider]  potassium chloride (K-DUR) 10 MEQ tablet TAKE ONE TABLET BY MOUTH TWICE DAILY 12/30/14   Herring, Orville Govern, NP  pravastatin (PRAVACHOL) 20 MG tablet Take 20 mg by mouth daily.    [provider]  sertraline (ZOLOFT) 50 MG tablet Take 50 mg by mouth daily.    [provider]  spironolactone (ALDACTONE) 25 MG tablet Take 25 mg by mouth daily.    [provider]  torsemide (DEMADEX) 20 MG tablet Take 20 mg by mouth daily.    [provider]    Allergies Amlodipine, Hydrochlorothiazide, Lisinopril, Sitagliptin, and Zoloft [sertraline]  Family History  Problem Relation Age of Onset  . Colon cancer Mother        "had some type of tumor in the vulva liver unknown primary"  . Colon cancer Brother        "died at young age"  . Breast cancer Neg Hx     Social History Social History   Tobacco Use  . Smoking status: Former Smoker    Packs/day: 1.00    Years: 3.00    Pack years: 3.00  . Smokeless tobacco: Never Used  Substance Use Topics  . Alcohol use: Yes    Alcohol/week: 1.0 standard drinks    Types: 1 Cans of beer per week    Comment: socially beer  . Drug use: No       Review of Systems Constitutional: No fever/chills Eyes: No visual changes. ENT: No sore throat. Cardiovascular: No chest pain Respiratory: Positive for SOB Gastrointestinal: No abdominal pain.  No nausea, no vomiting.  No diarrhea.  No constipation. Genitourinary: Negative for dysuria. Musculoskeletal: Negative for back pain.  Positive leg swelling Skin: Negative for rash. Neurological: Negative for headaches, focal weakness or numbness. All other ROS negative ____________________________________________   PHYSICAL EXAM:  VITAL SIGNS: Temperature 98.6 F (37 C), temperature source Oral.  Constitutional:  Alert and oriented. Well appearing and in no acute distress. Eyes: Conjunctivae are normal. EOMI. Head: Atraumatic. Nose: No congestion/rhinnorhea. Mouth/Throat: Mucous membranes are moist.   Neck: No stridor. Trachea Midline. FROM Cardiovascular: Normal rate, regular rhythm. Grossly normal heart sounds.  Good peripheral circulation. Respiratory: Clear lungs, no increased work of breathing Gastrointestinal: Soft and nontender. No distention. No abdominal bruits.  Musculoskeletal: 1+ edema bilaterally.  No joint effusions. Neurologic:  Normal speech and language. No gross focal neurologic deficits are appreciated.  Skin:  Skin is warm, dry and intact. No rash noted. Psychiatric: Mood and affect are normal. Speech and behavior are normal. GU: Deferred   ____________________________________________   LABS (all labs ordered are listed, but only abnormal results are displayed)  Labs Reviewed  CBC WITH DIFFERENTIAL/PLATELET - Abnormal; Notable for the following components:      Result Value   MCH 25.0 (*)    MCHC 29.9 (*)    RDW 18.7 (*)    All other components within normal limits  COMPREHENSIVE METABOLIC PANEL - Abnormal; Notable for the following components:   Glucose, Bld 260 (*)    BUN 58 (*)    Creatinine, Ser 2.87 (*)    Calcium 8.5 (*)    Total Protein  5.7 (*)    Albumin 3.1 (*)    AST 43 (*)    GFR calc non Af Amer 16 (*)    GFR calc Af Amer 19 (*)    All other components within normal limits  BRAIN NATRIURETIC PEPTIDE - Abnormal; Notable for the following components:   B Natriuretic Peptide 1,048.0 (*)    All other components within normal limits  TROPONIN I (HIGH SENSITIVITY) - Abnormal; Notable for the following components:   Troponin I (High Sensitivity) 29 (*)    All other components within normal limits  SARS CORONAVIRUS 2 (TAT 6-24 HRS)  MAGNESIUM  TROPONIN I (HIGH SENSITIVITY)   ____________________________________________   ED ECG REPORT I, Vanessa [place], the attending physician, personally viewed and interpreted this ECG.  EKG is normal sinus rate of 74, no ST elevation, T wave inversion in 2, 3, aVF, V4 through V6, these do appear new from prior ____________________________________________  RADIOLOGY I, Vanessa [place], personally viewed and evaluated these images (plain radiographs) as part of my medical decision making, as well as reviewing the written report by the radiologist.  ED MD interpretation: Chest x-ray without evidence of pulmonary edema  Official radiology report(s): Dg Chest Portable 1 View  Result Date: 03/11/2019 CLINICAL DATA:  Shortness of breath worsening for 1 week EXAM: PORTABLE CHEST 1 VIEW COMPARISON:  Radiograph 09/16/2017 CT 09/14/2011 FINDINGS: No consolidation, features of edema, pneumothorax, or effusion. Pulmonary vascularity is normally distributed. Prominence of the cardiac silhouette is likely related to the portable technique. No acute osseous or soft tissue abnormality. IMPRESSION: No acute cardiopulmonary disease. Electronically Signed   By: Lovena Le M.D.   On: 03/11/2019 03:33    ____________________________________________   PROCEDURES  Procedure(s) performed (including Critical Care):  Procedures   ____________________________________________   INITIAL IMPRESSION /  ASSESSMENT AND PLAN / ED COURSE   Priscilla Villanueva was evaluated in Emergency Department on 03/11/2019 for the symptoms described in the history of present illness. She was evaluated in the context of the global COVID-19 pandemic, which necessitated consideration that the patient might be at risk for infection with the SARS-CoV-2 virus that causes COVID-19. Institutional protocols and algorithms that pertain to the evaluation of patients at risk  for COVID-19 are in a state of rapid change based on information released by regulatory bodies including the CDC and federal and state organizations. These policies and algorithms were followed during the patient's care in the ED.     Pt presents with SOB. Differential includes: CHF will get BNP and xray  PNA-will get xray to evaluation Anemia-CBC to evaluate ACS- will get trops Arrhythmia-Will get EKG and keep on monitor.  COVID- will get testing per algorithm. PE-lower suspicion given no risk factors and other cause more likely  Chest x-ray was negative.  BNP elevated at 1000 and troponin elevated at 29 but no priors to compare to.  No evidence of anemia.  Kidney function is elevated 2.87 so is above the threshold to get CT PE.  It is interesting that patient feels short of breath but has no edema on her chest x-ray.  She does have some new T wave inversions in with the slightly elevated troponin and BNP consider pulmonary embolism.  Given patient's not require any oxygen and is not tachycardic I do not want to start heparin at this time for PE however think it be reasonable to add on DVT ultrasounds and discussed with hospital team for admission for echocardiogram.  Patient may benefit from VQ scan or CT PE if she continues to have shortness of breath.  I discussed  my concerns with the hospital team and they will admit patient.   ____________________________________________   FINAL CLINICAL IMPRESSION(S) / ED DIAGNOSES   Final diagnoses:  Leg  swelling  Shortness of breath  Elevated troponin     MEDICATIONS GIVEN DURING THIS VISIT:  Medications - No data to display   ED Discharge Orders    None       Note:  This document was prepared using Dragon voice recognition software and may include unintentional dictation errors.   Vanessa [place], MD 03/11/19 [phone]

## 2019-03-11 NOTE — ED Notes (Signed)
Pt moved into hospital bed 

## 2019-03-11 NOTE — ED Triage Notes (Signed)
Pt arrived via EMS from home where pt reported increased SOB x1 week, worsening tonight while lying down. Pt has bilateral swelling in lower extremities. Per pt, recent decrease in lasix due to kidney function. Pt is A&O x4, 94% on RA. Hx/o CHF.

## 2019-03-11 NOTE — ED Notes (Signed)
Gave pt lunch tray with juice. 

## 2019-03-11 NOTE — H&P (Signed)
[facility] [place] NAME: Priscilla Villanueva    MR#:  [id number]  DATE OF BIRTH:  25-Oct-1950  DATE OF ADMISSION:  03/11/2019  PRIMARY CARE PHYSICIAN: Tracie Harrier, MD   REQUESTING/REFERRING PHYSICIAN: Marjean Donna, MD CHIEF COMPLAINT:   Chief Complaint  Patient presents with  . Shortness of Breath  . Leg Swelling    HISTORY OF PRESENT ILLNESS:  Priscilla Villanueva  is a 68 y.o. African-American female with a known history of multiple medical problems that will be mentioned below including diastolic CHF, hypertension coronary artery disease, depression, type 2 diabetes mellitus and dyslipidemia, who presented to the emergency room with acute onset of worsening dyspnea over the last couple weeks with increasing leg edema, orthopnea and paroxysmal nocturnal dyspnea.  She uses oxygen at night.  She denies any chest pain or palpitations.  No nausea vomiting or abdominal pain.  No dysuria, oliguria or hematuria or flank pain.  She has been taking her diuretics regularly.  Upon presentation to the emergency room EKG showed normal sinus rhythm with a rate of 74 with right axis deviation and Q waves in V1 with poor R wave progression and flat T wave inversion laterally.  Portable chest x-ray showed no acute cardiopulmonary disease.  Her vital signs revealed blood pressure 131/78 with a pulse currently of 97% on room air and respiratory to 20.  She was afebrile.  Labs revealed an BNP of 1048 and high-sensitivity troponin I of 29 with unremarkable CBC.  Her BUN was 58 and creatinine 2.87 compared to 24 and 1.55 in April of last year.  Blood glucose was 260 and albumin 3.1 with AST 43 and troponin 5.7.  Her potassium was 5.1.  The patient had a low bilateral lower extremity venous duplex that are currently pending.  She will be admitted to the telemetry bed for further evaluation and management. PAST MEDICAL HISTORY:   Past Medical History:  Diagnosis Date  . CAD  (coronary artery disease)   . CHF (congestive heart failure) ([place])   . Colon cancer ([place]) 2009   with chemotherapy  . Colon cancer ([place]) 11/26/2014  . Depression   . Diabetes mellitus without complication ([place])   . Hypercholesteremia   . Hypertension   . Obesity   . Personal history of chemotherapy 2009   colon ca    PAST SURGICAL HISTORY:   Past Surgical History:  Procedure Laterality Date  . ABDOMINAL HYSTERECTOMY     PARTIAL  . COLECTOMY    . COLONOSCOPY WITH PROPOFOL N/A 07/16/2017   Procedure: COLONOSCOPY WITH PROPOFOL;  Surgeon: Lollie Sails, MD;  Location: [facility] ENDOSCOPY;  Service: Endoscopy;  Laterality: N/A;  . PERIPHERAL VASCULAR CATHETERIZATION N/A 01/26/2015   Procedure: Glori Luis Cath Removal;  Surgeon: Katha Cabal, MD;  Location: [place] CV LAB;  Service: Cardiovascular;  Laterality: N/A;  . UPPER GI ENDOSCOPY      SOCIAL HISTORY:   Social History   Tobacco Use  . Smoking status: Former Smoker    Packs/day: 1.00    Years: 3.00    Pack years: 3.00  . Smokeless tobacco: Never Used  Substance Use Topics  . Alcohol use: Yes    Alcohol/week: 1.0 standard drinks    Types: 1 Cans of beer per week    Comment: socially beer    FAMILY HISTORY:   Family History  Problem Relation Age of Onset  . Colon cancer Mother        "  had some type of tumor in the vulva liver unknown primary"  . Colon cancer Brother        "died at young age"  . Breast cancer Neg Hx     DRUG ALLERGIES:   Allergies  Allergen Reactions  . Amlodipine Swelling    Causes pt's feet and legs to swell...  . Hydrochlorothiazide Other (See Comments)  . Lisinopril Other (See Comments)  . Sitagliptin Diarrhea  . Zoloft [Sertraline] Other (See Comments)    REVIEW OF SYSTEMS:   ROS As per history of present illness. All pertinent systems were reviewed above. Constitutional,  HEENT, cardiovascular, respiratory, GI, GU, musculoskeletal, neuro, psychiatric, endocrine,   integumentary and hematologic systems were reviewed and are otherwise  negative/unremarkable except for positive findings mentioned above in the HPI.   MEDICATIONS AT HOME:   Prior to Admission medications   Medication Sig Start Date End Date Taking? Authorizing Provider  allopurinol (ZYLOPRIM) 100 MG tablet Take 100 mg by mouth 2 (two) times daily.     [provider]  ALPRAZolam Duanne Moron) 0.25 MG tablet Take 0.25 mg by mouth at bedtime as needed for anxiety.    [provider]  aspirin 81 MG EC tablet Take 81 mg by mouth daily. Swallow whole.    [provider]  b complex vitamins capsule Take 1 capsule by mouth daily.    [provider]  cloNIDine (CATAPRES) 0.3 MG tablet 0.3 mg 3 (three) times daily.  11/24/14   [provider]  colchicine 0.6 MG tablet Take 2 tablets by mouth as needed. 08/28/14   [provider]  cyclobenzaprine (FLEXERIL) 10 MG tablet Take 1 tablet (10 mg total) by mouth 3 (three) times daily as needed. 06/06/17   Sable Feil, PA-C  ferrous sulfate 325 (65 FE) MG EC tablet Take 325 mg by mouth every morning. Reported on 06/11/2015    [provider]  furosemide (LASIX) 40 MG tablet Take 40 mg by mouth 2 (two) times daily. 09/28/14   [provider]  HUMALOG MIX 75/25 KWIKPEN (75-25) 100 UNIT/ML Claiborne Rigg  11/04/14   [provider]  hydrALAZINE (APRESOLINE) 50 MG tablet Take 100 mg by mouth 3 (three) times daily.     [provider]  insulin lispro (HUMALOG) 100 UNIT/ML injection Inject 18 Units into the skin 2 (two) times daily before a meal. Reported on 06/11/2015    [provider]  lidocaine (LIDODERM) 5 % Place 1 patch onto the skin every 12 (twelve) hours. Remove & Discard patch within 12 hours or as directed by MD 02/21/19 02/21/20  Lannie Fields, PA-C  liraglutide (VICTOZA) 18 MG/3ML SOPN Inject 0.6 mg into the skin.    [provider]  Magnesium 250 MG TABS Take 2  tablets by mouth 2 (two) times daily.    [provider]  magnesium oxide (MAG-OX) 400 MG tablet Take 400 mg by mouth daily.    [provider]  metFORMIN (GLUCOPHAGE) 1000 MG tablet Take 1,000 mg by mouth 2 (two) times daily with a meal.    [provider]  metoprolol (TOPROL-XL) 200 MG 24 hr tablet Take 200 mg by mouth daily.    [provider]  olmesartan (BENICAR) 40 MG tablet Take 40 mg by mouth daily.    [provider]  polyethylene glycol (MIRALAX / GLYCOLAX) packet Take 17 g by mouth daily.    [provider]  potassium chloride (K-DUR) 10 MEQ tablet TAKE ONE TABLET  BY MOUTH TWICE DAILY 12/30/14   Herring, Orville Govern, NP  pravastatin (PRAVACHOL) 20 MG tablet Take 20 mg by mouth daily.    [provider]  sertraline (ZOLOFT) 50 MG tablet Take 50 mg by mouth daily.    [provider]  spironolactone (ALDACTONE) 25 MG tablet Take 25 mg by mouth daily.    [provider]  torsemide (DEMADEX) 20 MG tablet Take 20 mg by mouth daily.    [provider]      VITAL SIGNS:  Blood pressure 118/64, pulse 71, temperature 98.6 F (37 C), temperature source Oral, resp. rate 10, SpO2 94 %.  PHYSICAL EXAMINATION:  Physical Exam  GENERAL:  68 y.o.-year-old African-American female patient lying in the bed in minimal respiratory distress with conversational dyspnea EYES: Pupils equal, round, reactive to light and accommodation. No scleral icterus. Extraocular muscles intact.  HEENT: Head atraumatic, normocephalic. Oropharynx and nasopharynx clear.  NECK:  Supple, no jugular venous distention. No thyroid enlargement, no tenderness.  LUNGS: Slightly diminished bibasilar breath sounds CARDIOVASCULAR: Regular rate and rhythm, S1, S2 normal. No murmurs, rubs, or gallops.  ABDOMEN: Soft, nondistended, nontender. Bowel sounds present. No organomegaly or mass.  EXTREMITIES: 2+ bilateral lower extremity pitting edema with  no cyanosis, or clubbing.  NEUROLOGIC: Cranial nerves II through XII are intact. Muscle strength 5/5 in all extremities. Sensation intact. Gait not checked.  PSYCHIATRIC: The patient is alert and oriented x 3.  Normal affect and good eye contact. SKIN: No obvious rash, lesion, or ulcer.   LABORATORY PANEL:   CBC Recent Labs  Lab 03/11/19 0258  WBC 4.3  HGB 12.7  HCT 42.5  PLT 195   ------------------------------------------------------------------------------------------------------------------  Chemistries  Recent Labs  Lab 03/11/19 0258  NA 141  K 5.1  CL 106  CO2 23  GLUCOSE 260*  BUN 58*  CREATININE 2.87*  CALCIUM 8.5*  MG 2.0  AST 43*  ALT 37  ALKPHOS 110  BILITOT 0.7   ------------------------------------------------------------------------------------------------------------------  Cardiac Enzymes No results for input(s): TROPONINI in the last 168 hours. ------------------------------------------------------------------------------------------------------------------  RADIOLOGY:  Dg Chest Portable 1 View  Result Date: 03/11/2019 CLINICAL DATA:  Shortness of breath worsening for 1 week EXAM: PORTABLE CHEST 1 VIEW COMPARISON:  Radiograph 09/16/2017 CT 09/14/2011 FINDINGS: No consolidation, features of edema, pneumothorax, or effusion. Pulmonary vascularity is normally distributed. Prominence of the cardiac silhouette is likely related to the portable technique. No acute osseous or soft tissue abnormality. IMPRESSION: No acute cardiopulmonary disease. Electronically Signed   By: Lovena Le M.D.   On: 03/11/2019 03:33      IMPRESSION AND PLAN:   1.  Acute on chronic diastolic CHF. -The patient will be admitted to a telemetry bed and will be diuresed with IV Lasix.   -Will follow serial cardiac enzymes. - Will obtain a 2D echo(if not done last 6 months) and a cardiology consultation in a.m. -I notified Dr. Clayborn Bigness about the patient. -Given lack of  pulmonary edema on her chest x-ray, will obtain VQ scan this a.m.  Patient also has bilateral lower extremity venous duplex which result is currently pending.  2.  Acute kidney injury superimposed on stage III chronic kidney disease. -This is likely prerenal secondary to acute CHF. -We will monitor renal functions with diuretics. -We will holding off Benicar and Metformin.  3.  Hyperkalemia. -We will holding of potassium and Aldactone as well as Benicar.  4.  Type 2 diabetes mellitus. -The patient will be placed on supplement coverage with  NovoLog. -We will hold off Metformin and continue Victoza.  5.   Hypertension. -We will continue hydralazine and Toprol-XL and hold off Benicar.  6.  Dyslipidemia. -Statin therapy will be resumed.  7.  Gout. -We will continue allopurinol and as needed colchicine.  8.  DVT prophylaxis. -Subcutaneous Lovenox    All the records are reviewed and case discussed with ED provider. The plan of care was discussed in details with the patient (and family). I answered all questions. The patient agreed to proceed with the above mentioned plan. Further management will depend upon hospital course.   CODE STATUS: Full code  TOTAL TIME TAKING CARE OF THIS PATIENT: 55 minutes.    Christel Mormon M.D on 03/11/2019 at 5:41 AM  Pager - [phone]  After 6pm go to [email or web address] - Proofreader  [facility]  Office  [phone]  CC: Primary care physician; Tracie Harrier, MD   Note: This dictation was prepared with Dragon dictation along with smaller phrase technology. Any transcriptional errors that result from this process are unintentional.

## 2019-03-11 NOTE — ED Notes (Signed)
Pt walked to toilet at this time without difficulty.

## 2019-03-11 NOTE — Consult Note (Signed)
Reason for Consult: Heart failure shortness of breath leg edema Referring Physician: Dr. Sidney Ace hospitalist, Dr. Roque Lias primary Cardiologist [facility]  Priscilla Villanueva is an 68 y.o. female.  HPI: Patient's a 68 year old obese African-American female multiple medical problems diastolic congestive heart failure hypertension coronary disease depression diabetes hyperlipidemia leg edema significant renal insufficiency stage III obesity who presents with worsening dyspnea shortness of breath over the last few weeks with worsening leg edema orthopnea PND.  She was recently seen by nephrology as well as endocrine at some point her diuretics were discontinued in favor of Aldactone she states she is steadily gotten worse with worsening dyspnea shortness of breath and leg swelling in the emergency room she was found to have elevated BNP of over thousand troponins have been relatively flat she denies significant chest pain has mild hyperkalemia now presents for further management and care shortness of breath is much better after mild diuretic therapy  Past Medical History:  Diagnosis Date  . CAD (coronary artery disease)   . CHF (congestive heart failure) ([place])   . Colon cancer ([place]) 2009   with chemotherapy  . Colon cancer ([place]) 11/26/2014  . Depression   . Diabetes mellitus without complication ([place])   . Hypercholesteremia   . Hypertension   . Obesity   . Personal history of chemotherapy 2009   colon ca    Past Surgical History:  Procedure Laterality Date  . ABDOMINAL HYSTERECTOMY     PARTIAL  . COLECTOMY    . COLONOSCOPY WITH PROPOFOL N/A 07/16/2017   Procedure: COLONOSCOPY WITH PROPOFOL;  Surgeon: Lollie Sails, MD;  Location: [facility] ENDOSCOPY;  Service: Endoscopy;  Laterality: N/A;  . PERIPHERAL VASCULAR CATHETERIZATION N/A 01/26/2015   Procedure: Glori Luis Cath Removal;  Surgeon: Katha Cabal, MD;  Location: [place] CV LAB;  Service: Cardiovascular;  Laterality: N/A;  . UPPER GI  ENDOSCOPY      Family History  Problem Relation Age of Onset  . Colon cancer Mother        "had some type of tumor in the vulva liver unknown primary"  . Colon cancer Brother        "died at young age"  . Breast cancer Neg Hx     Social History:  reports that she has quit smoking. She has a 3.00 pack-year smoking history. She has never used smokeless tobacco. She reports current alcohol use of about 1.0 standard drinks of alcohol per week. She reports that she does not use drugs.  Allergies:  Allergies  Allergen Reactions  . Amlodipine Swelling    Causes pt's feet and legs to swell...  . Hydrochlorothiazide Other (See Comments)  . Lisinopril Other (See Comments)  . Sitagliptin Diarrhea  . Zoloft [Sertraline] Other (See Comments)    Medications: I have reviewed the patient's current medications.  Results for orders placed or performed during the hospital encounter of 03/11/19 (from the past 48 hour(s))  CBC with Differential     Status: Abnormal   Collection Time: 03/11/19  2:58 AM  Result Value Ref Range   WBC 4.3 4.0 - 10.5 K/uL   RBC 5.08 3.87 - 5.11 MIL/uL   Hemoglobin 12.7 12.0 - 15.0 g/dL   HCT 42.5 36.0 - 46.0 %   MCV 83.7 80.0 - 100.0 fL   MCH 25.0 (L) 26.0 - 34.0 pg   MCHC 29.9 (L) 30.0 - 36.0 g/dL   RDW 18.7 (H) 11.5 - 15.5 %   Platelets 195 150 - 400 K/uL  nRBC 0.0 0.0 - 0.2 %   Neutrophils Relative % 63 %   Neutro Abs 2.7 1.7 - 7.7 K/uL   Lymphocytes Relative 26 %   Lymphs Abs 1.1 0.7 - 4.0 K/uL   Monocytes Relative 9 %   Monocytes Absolute 0.4 0.1 - 1.0 K/uL   Eosinophils Relative 1 %   Eosinophils Absolute 0.1 0.0 - 0.5 K/uL   Basophils Relative 1 %   Basophils Absolute 0.0 0.0 - 0.1 K/uL   Immature Granulocytes 0 %   Abs Immature Granulocytes 0.01 0.00 - 0.07 K/uL    Comment: Performed at [facility], [address] [place], [place] [address]  Comprehensive metabolic panel     Status: Abnormal   Collection Time: 03/11/19  2:58 AM   Result Value Ref Range   Sodium 141 135 - 145 mmol/L   Potassium 5.1 3.5 - 5.1 mmol/L   Chloride 106 98 - 111 mmol/L   CO2 23 22 - 32 mmol/L   Glucose, Bld 260 (H) 70 - 99 mg/dL   BUN 58 (H) 8 - 23 mg/dL   Creatinine, Ser 2.87 (H) 0.44 - 1.00 mg/dL   Calcium 8.5 (L) 8.9 - 10.3 mg/dL   Total Protein 5.7 (L) 6.5 - 8.1 g/dL   Albumin 3.1 (L) 3.5 - 5.0 g/dL   AST 43 (H) 15 - 41 U/L   ALT 37 0 - 44 U/L   Alkaline Phosphatase 110 38 - 126 U/L   Total Bilirubin 0.7 0.3 - 1.2 mg/dL   GFR calc non Af Amer 16 (L) >60 mL/min   GFR calc Af Amer 19 (L) >60 mL/min   Anion gap 12 5 - 15    Comment: Performed at [facility] [address] [place], [place] [address]  Brain natriuretic peptide     Status: Abnormal   Collection Time: 03/11/19  2:58 AM  Result Value Ref Range   B Natriuretic Peptide 1,048.0 (H) 0.0 - 100.0 pg/mL    Comment: Performed at [facility], [place], [place] [address]  Troponin I (High Sensitivity)     Status: Abnormal   Collection Time: 03/11/19  2:58 AM  Result Value Ref Range   Troponin I (High Sensitivity) 29 (H) <18 ng/L    Comment: (NOTE) Elevated high sensitivity troponin I (hsTnI) values and significant  changes across serial measurements may suggest ACS but many other  chronic and acute conditions are known to elevate hsTnI results.  Refer to the "Links" section for chest pain algorithms and additional  guidance. Performed at [facility], [address] [place], [place] [address]   Magnesium     Status: None   Collection Time: 03/11/19  2:58 AM  Result Value Ref Range   Magnesium 2.0 1.7 - 2.4 mg/dL    Comment: Performed at [facility], [place], [place] [address]  SARS CORONAVIRUS 2 (TAT 6-24 HRS) Nasopharyngeal Nasopharyngeal Swab     Status: None   Collection Time: 03/11/19  5:05 AM   Specimen: Nasopharyngeal Swab  Result Value Ref Range   SARS Coronavirus 2 NEGATIVE NEGATIVE     Comment: (NOTE) SARS-CoV-2 target nucleic acids are NOT DETECTED. The SARS-CoV-2 RNA is generally detectable in upper and lower respiratory specimens during the acute phase of infection. Negative results do not preclude SARS-CoV-2 infection, do not rule out co-infections with other pathogens, and should not be used as the sole basis for treatment or other patient  management decisions. Negative results must be combined with clinical observations, patient history, and epidemiological information. The expected result is Negative. Fact Sheet for Patients: SugarRoll.be Fact Sheet for Healthcare Providers: https://www.woods-mathews.com/ This test is not yet approved or cleared by the [place] FDA and  has been authorized for detection and/or diagnosis of SARS-CoV-2 by FDA under an Emergency Use Authorization (EUA). This EUA will remain  in effect (meaning this test can be used) for the duration of the COVID-19 declaration under Section 56 4(b)(1) of the Act, 21 U.S.C. section 360bbb-3(b)(1), unless the authorization is terminated or revoked sooner. Performed at [facility], [place] [address], [place], [place] [address]   Troponin I (High Sensitivity)     Status: Abnormal   Collection Time: 03/11/19  5:05 AM  Result Value Ref Range   Troponin I (High Sensitivity) 26 (H) <18 ng/L    Comment: (NOTE) Elevated high sensitivity troponin I (hsTnI) values and significant  changes across serial measurements may suggest ACS but many other  chronic and acute conditions are known to elevate hsTnI results.  Refer to the "Links" section for chest pain algorithms and additional  guidance. Performed at [facility], [address] [place], [place] [address]   Hemoglobin A1c     Status: Abnormal   Collection Time: 03/11/19  8:34 AM  Result Value Ref Range   Hgb A1c MFr Bld 7.2 (H) 4.8 - 5.6 %    Comment: (NOTE) Pre diabetes:           5.7%-6.4% Diabetes:              >6.4% Glycemic control for   <7.0% adults with diabetes    Mean Plasma Glucose 159.94 mg/dL    Comment: Performed at [facility] [address] [place], [place] [address]  Glucose, capillary     Status: Abnormal   Collection Time: 03/11/19  8:44 AM  Result Value Ref Range   Glucose-Capillary 189 (H) 70 - 99 mg/dL  Glucose, capillary     Status: Abnormal   Collection Time: 03/11/19 12:22 PM  Result Value Ref Range   Glucose-Capillary 215 (H) 70 - 99 mg/dL    US Venous Img Lower Bilateral  Result Date: 03/11/2019 CLINICAL DATA:  Bilateral leg swelling EXAM: BILATERAL LOWER EXTREMITY VENOUS DOPPLER ULTRASOUND TECHNIQUE: Gray-scale sonography with graded compression, as well as color Doppler and duplex ultrasound were performed to evaluate the lower extremity deep venous systems from the level of the common femoral vein and including the common femoral, femoral, profunda femoral, popliteal and calf veins including the posterior tibial, peroneal and gastrocnemius veins when visible. The superficial great saphenous vein was also interrogated. Spectral Doppler was utilized to evaluate flow at rest and with distal augmentation maneuvers in the common femoral, femoral and popliteal veins. COMPARISON:  None. FINDINGS: RIGHT LOWER EXTREMITY Common Femoral Vein: No evidence of thrombus. Normal compressibility, respiratory phasicity and response to augmentation. Saphenofemoral Junction: No evidence of thrombus. Profunda Femoral Vein: No evidence of thrombus. Femoral Vein: No evidence of thrombus. Popliteal Vein: No evidence of thrombus. Calf Veins: No evidence of thrombus. LEFT LOWER EXTREMITY Common Femoral Vein: No evidence of thrombus. Saphenofemoral Junction: No evidence of thrombus. Profunda Femoral Vein: No evidence of thrombus. Femoral Vein: No evidence of thrombus. Popliteal Vein: No evidence of thrombus. Calf Veins: No evidence of thrombus. IMPRESSION: No  evidence of deep venous thrombosis in either lower extremity. Electronically Signed   By: Neva Seat.D.  On: 03/11/2019 07:05   Dg Chest Portable 1 View  Result Date: 03/11/2019 CLINICAL DATA:  Shortness of breath worsening for 1 week EXAM: PORTABLE CHEST 1 VIEW COMPARISON:  Radiograph 09/16/2017 CT 09/14/2011 FINDINGS: No consolidation, features of edema, pneumothorax, or effusion. Pulmonary vascularity is normally distributed. Prominence of the cardiac silhouette is likely related to the portable technique. No acute osseous or soft tissue abnormality. IMPRESSION: No acute cardiopulmonary disease. Electronically Signed   By: Lovena Le M.D.   On: 03/11/2019 03:33    Review of Systems  Constitutional: Positive for diaphoresis and malaise/fatigue.  HENT: Positive for congestion.   Eyes: Negative.   Respiratory: Positive for shortness of breath.   Cardiovascular: Positive for chest pain, orthopnea, leg swelling and PND.  Gastrointestinal: Negative.   Genitourinary: Negative.   Musculoskeletal: Positive for falls and myalgias.  Skin: Negative.   Neurological: Positive for dizziness and weakness.  Endo/Heme/Allergies: Negative.   Psychiatric/Behavioral: Negative.    Blood pressure 130/74, pulse 69, temperature 98.6 F (37 C), temperature source Oral, resp. rate 16, SpO2 98 %. Physical Exam  Assessment/Plan: Shortness of breath Edema lower extremity Diastolic congestive heart failure Hypertension Coronary artery disease Diabetes type 2 Dyslipidemia Obesity Possible obstructive sleep apnea Chronic renal insufficiency stage III . Plan Agree with admit for rule out myocardial infarction Continue supplemental oxygen Recommend echocardiogram for assessment left ventricular function Agree with VQ scan for evaluation of possible pulmonary embolus unable to do a CAT scan because of renal sufficiency Consider duplex ultrasound of lower extremity rule out DVT Recommend diuretic  therapy for heart failure We will hold ARB as well as Metformin for now Recommend renal nephrology be involved with treatment of heart failure and renal insufficiency Hold potassium and Aldactone as well as Benicar for hyperkalemia Consider functional study with Myoview if symptoms persist Do not recommend cardiac cath for multiple reasons  Taelynn Mcelhannon D Reichen Hutzler 03/11/2019, 2:02 PM

## 2019-03-11 NOTE — ED Notes (Signed)
Pt meal tray provided

## 2019-03-11 NOTE — Progress Notes (Signed)
Admitted this morning for shortness of breath, CHF exacerbation.  Continue IV Lasix, see how he does.  Continue present medication, monitor kidney function closely as patient does have acute on chronic renal failure.

## 2019-03-11 NOTE — Progress Notes (Signed)
*  PRELIMINARY RESULTS* Echocardiogram 2D Echocardiogram has been performed.  Priscilla Villanueva 03/11/2019, 1:24 PM

## 2019-03-12 ENCOUNTER — Inpatient Hospital Stay: Payer: Medicare Other

## 2019-03-12 LAB — CBC WITH DIFFERENTIAL/PLATELET
Abs Immature Granulocytes: 0.02 10*3/uL (ref 0.00–0.07)
Basophils Absolute: 0 10*3/uL (ref 0.0–0.1)
Basophils Relative: 1 %
Eosinophils Absolute: 0.1 10*3/uL (ref 0.0–0.5)
Eosinophils Relative: 2 %
HCT: 35.7 % — ABNORMAL LOW (ref 36.0–46.0)
Hemoglobin: 10.9 g/dL — ABNORMAL LOW (ref 12.0–15.0)
Immature Granulocytes: 1 %
Lymphocytes Relative: 23 %
Lymphs Abs: 1 10*3/uL (ref 0.7–4.0)
MCH: 25.1 pg — ABNORMAL LOW (ref 26.0–34.0)
MCHC: 30.5 g/dL (ref 30.0–36.0)
MCV: 82.1 fL (ref 80.0–100.0)
Monocytes Absolute: 0.3 10*3/uL (ref 0.1–1.0)
Monocytes Relative: 8 %
Neutro Abs: 2.8 10*3/uL (ref 1.7–7.7)
Neutrophils Relative %: 65 %
Platelets: 152 10*3/uL (ref 150–400)
RBC: 4.35 MIL/uL (ref 3.87–5.11)
RDW: 18.2 % — ABNORMAL HIGH (ref 11.5–15.5)
WBC: 4.3 10*3/uL (ref 4.0–10.5)
nRBC: 0 % (ref 0.0–0.2)

## 2019-03-12 LAB — GLUCOSE, CAPILLARY
Glucose-Capillary: 183 mg/dL — ABNORMAL HIGH (ref 70–99)
Glucose-Capillary: 179 mg/dL — ABNORMAL HIGH (ref 70–99)
Glucose-Capillary: 182 mg/dL — ABNORMAL HIGH (ref 70–99)
Glucose-Capillary: 161 mg/dL — ABNORMAL HIGH (ref 70–99)

## 2019-03-12 LAB — BASIC METABOLIC PANEL
Anion gap: 11 (ref 5–15)
BUN: 56 mg/dL — ABNORMAL HIGH (ref 8–23)
CO2: 23 mmol/L (ref 22–32)
Calcium: 8.6 mg/dL — ABNORMAL LOW (ref 8.9–10.3)
Chloride: 108 mmol/L (ref 98–111)
Creatinine, Ser: 2.6 mg/dL — ABNORMAL HIGH (ref 0.44–1.00)
GFR calc Af Amer: 21 mL/min — ABNORMAL LOW (ref >60)
GFR calc non Af Amer: 18 mL/min — ABNORMAL LOW (ref >60)
Glucose, Bld: 178 mg/dL — ABNORMAL HIGH (ref 70–99)
Potassium: 4.8 mmol/L (ref 3.5–5.1)
Sodium: 142 mmol/L (ref 135–145)

## 2019-03-12 MED ORDER — INSULIN GLARGINE 100 UNIT/ML ~~LOC~~ SOLN
8.0000 [IU] | Freq: Every day | SUBCUTANEOUS | Status: DC
  Administered 2019-03-12: 8 [IU] via SUBCUTANEOUS
  Filled 2019-03-12 (×2): qty 0.08

## 2019-03-12 MED ORDER — COLCHICINE 0.6 MG PO TABS
0.3000 mg | ORAL_TABLET | Freq: Every day | ORAL | Status: DC
  Filled 2019-03-12: qty 0.5

## 2019-03-12 NOTE — Progress Notes (Signed)
[facility] [place] NAME: Priscilla Villanueva    MR#:  [id number]  DATE OF BIRTH:  03/28/1951  SUBJECTIVE: Seen, admitted yesterday for CHF exacerbation.  On IV Lasix, says that she is feeling much better.  Told me that she is off Lasix for the 1 month as she was started on losartan by endocrinologist.  Patient continued to feel worse with shortness of breath, weight gain, pedal edema so she started to take Lasix again it came to hospital because of that.  CHIEF COMPLAINT:   Chief Complaint  Patient presents with  . Shortness of Breath  . Leg Swelling   Says that she is feeling dizzy and falling lately. REVIEW OF SYSTEMS:   ROS CONSTITUTIONAL: No fever, fatigue or weakness.  EYES: No blurred or double vision.  EARS, NOSE, AND THROAT: No tinnitus or ear pain.  RESPIRATORY: No cough, shortness of breath, wheezing or hemoptysis.  CARDIOVASCULAR: No chest pain, orthopnea, edema.  GASTROINTESTINAL: No nausea, vomiting, diarrhea or abdominal pain.  GENITOURINARY: No dysuria, hematuria.  ENDOCRINE: No polyuria, nocturia,  HEMATOLOGY: No anemia, easy bruising or bleeding SKIN: No rash or lesion. MUSCULOSKELETAL: No joint pain or arthritis.   NEUROLOGIC: No tingling, numbness, weakness.  PSYCHIATRY: No anxiety or depression.   DRUG ALLERGIES:   Allergies  Allergen Reactions  . Amlodipine Swelling    Causes pt's feet and legs to swell...  . Hydrochlorothiazide Other (See Comments)  . Lisinopril Other (See Comments)  . Sitagliptin Diarrhea  . Zoloft [Sertraline] Other (See Comments)    VITALS:  Blood pressure (!) 153/65, pulse 67, temperature 97.7 F (36.5 C), temperature source Oral, resp. rate 16, height 5\' 4"  (1.626 m), weight 113.4 kg, SpO2 96 %.  PHYSICAL EXAMINATION:  GENERAL:  68 y.o.-year-old patient lying in the bed with no acute distress.  EYES: Pupils equal, round, reactive to light . No scleral icterus. Extraocular muscles  intact.  HEENT: Head atraumatic, normocephalic. Oropharynx and nasopharynx clear.  NECK:  Supple, no jugular venous distention. No thyroid enlargement, no tenderness.  LUNGS: Normal breath sounds bilaterally, no wheezing, rales,rhonchi or crepitation. No use of accessory muscles of respiration.  CARDIOVASCULAR: S1, S2 normal. No murmurs, rubs, or gallops.  ABDOMEN: Soft, nontender, nondistended. Bowel sounds present. No organomegaly or mass.  EXTREMITIES: Pedal edema up to knee NEUROLOGIC: Cranial nerves II through XII are intact. Muscle strength 5/5 in all extremities. Sensation intact. Gait not checked.  PSYCHIATRIC: The patient is alert and oriented x 3.  SKIN: No obvious rash, lesion, or ulcer.    LABORATORY PANEL:   CBC Recent Labs  Lab 03/12/19 0536  WBC 4.3  HGB 10.9*  HCT 35.7*  PLT 152   ------------------------------------------------------------------------------------------------------------------  Chemistries  Recent Labs  Lab 03/11/19 0258 03/12/19 0536  NA 141 142  K 5.1 4.8  CL 106 108  CO2 23 23  GLUCOSE 260* 178*  BUN 58* 56*  CREATININE 2.87* 2.60*  CALCIUM 8.5* 8.6*  MG 2.0  --   AST 43*  --   ALT 37  --   ALKPHOS 110  --   BILITOT 0.7  --    ------------------------------------------------------------------------------------------------------------------  Cardiac Enzymes No results for input(s): TROPONINI in the last 168 hours. ------------------------------------------------------------------------------------------------------------------  RADIOLOGY:  Nm Pulmonary Perf And Vent  Result Date: 03/11/2019 CLINICAL DATA:  Concern for pulmonary embolism.  Short of breath. EXAM: NUCLEAR MEDICINE VENTILATION - PERFUSION LUNG SCAN TECHNIQUE: Ventilation images were obtained in multiple projections using  inhaled aerosol Tc-64m DTPA. Perfusion images were obtained in multiple projections after intravenous injection of Tc-8m MAA.  RADIOPHARMACEUTICALS:  31.3 mCi of Tc-20m DTPA aerosol inhalation and 4.3 mCi Tc26m MAA IV COMPARISON:  Chest radiograph 03/11/2019 FINDINGS: Ventilation: No focal ventilation defect. Perfusion: No wedge shaped peripheral perfusion defects to suggest acute pulmonary embolism. IMPRESSION: No evidence acute pulmonary embolism. Electronically Signed   By: Suzy Bouchard M.D.   On: 03/11/2019 17:10   US Venous Img Lower Bilateral  Result Date: 03/11/2019 CLINICAL DATA:  Bilateral leg swelling EXAM: BILATERAL LOWER EXTREMITY VENOUS DOPPLER ULTRASOUND TECHNIQUE: Gray-scale sonography with graded compression, as well as color Doppler and duplex ultrasound were performed to evaluate the lower extremity deep venous systems from the level of the common femoral vein and including the common femoral, femoral, profunda femoral, popliteal and calf veins including the posterior tibial, peroneal and gastrocnemius veins when visible. The superficial great saphenous vein was also interrogated. Spectral Doppler was utilized to evaluate flow at rest and with distal augmentation maneuvers in the common femoral, femoral and popliteal veins. COMPARISON:  None. FINDINGS: RIGHT LOWER EXTREMITY Common Femoral Vein: No evidence of thrombus. Normal compressibility, respiratory phasicity and response to augmentation. Saphenofemoral Junction: No evidence of thrombus. Profunda Femoral Vein: No evidence of thrombus. Femoral Vein: No evidence of thrombus. Popliteal Vein: No evidence of thrombus. Calf Veins: No evidence of thrombus. LEFT LOWER EXTREMITY Common Femoral Vein: No evidence of thrombus. Saphenofemoral Junction: No evidence of thrombus. Profunda Femoral Vein: No evidence of thrombus. Femoral Vein: No evidence of thrombus. Popliteal Vein: No evidence of thrombus. Calf Veins: No evidence of thrombus. IMPRESSION: No evidence of deep venous thrombosis in either lower extremity. Electronically Signed   By: Monte Fantasia M.D.   On:  03/11/2019 07:05   Dg Chest Portable 1 View  Result Date: 03/11/2019 CLINICAL DATA:  Shortness of breath worsening for 1 week EXAM: PORTABLE CHEST 1 VIEW COMPARISON:  Radiograph 09/16/2017 CT 09/14/2011 FINDINGS: No consolidation, features of edema, pneumothorax, or effusion. Pulmonary vascularity is normally distributed. Prominence of the cardiac silhouette is likely related to the portable technique. No acute osseous or soft tissue abnormality. IMPRESSION: No acute cardiopulmonary disease. Electronically Signed   By: Lovena Le M.D.   On: 03/11/2019 03:33    EKG:   Orders placed or performed during the hospital encounter of 03/11/19  . EKG 12-Lead  . EKG 12-Lead  . ED EKG  . ED EKG    ASSESSMENT AND PLAN:   68 year old female with history of diabetes mellitus type 2, CKD stage III, hyperaldosteronism admitted because of acute respiratory failure and CHF exacerbation. #1 acute respiratory failure secondary to acute on chronic diastolic heart failure, clinically doing better on IV diuretics.  Check daily weights, I&O's, patient did not qualify for CHF as due to her obesity.,  EF 50 to 55%. 2.  Type 2 diabetes mellitus with long-term use of insulin and also CKD stage III: Stable, patient told me that she was following with Dr. Candiss Norse.  Follow kidney function closely, obtain outpatient nephrology follow-up if patient does well otherwise will consult nephrology while inpatient. 3.  History of hyperaldosteronism, patient is started on losartan by Dr. Gabriel Carina, and stopped on Lasix but patient continued to have swelling so admitted this time  Continue Lasix. 4.  Bilateral leg swelling, DVT study negative, PE study also negative.  Seen by Dr. Clayborn Bigness #5.  Chronic kidney disease stage III, patient is seen by nephrology, her renal ultrasound on July 1  showed no acute problem showed medical renal disease. #6 generalized anxiety, continue Xanax. 7.  Dizziness, multiple falls recently, check  orthostatic vitals and check ultrasound of carotids as well. #8.  Diabetes mellitus type 2, metformin stopped because of renal insufficiency, continue insulin with sliding scale coverage, plan to start low-dose Lantus.  All the records are reviewed and case discussed with Care Management/Social Workerr. Management plans discussed with the patient, family and they are in agreement.  CODE STATUS: Full code  TOTAL TIME TAKING CARE OF THIS PATIENT: 38 minutes.   POSSIBLE D/C IN 1-2DAYS, DEPENDING ON CLINICAL CONDITION. 50% time spent in counseling, coordination of care  Epifanio Lesches M.D on 03/12/2019 at 2:31 PM  Between 7am to 6pm - Pager - [phone]  After 6pm go to [email or web address] - password [email or web address] [facility]  Office  [phone]  CC: Primary care physician; Tracie Harrier, MD   Note: This dictation was prepared with Dragon dictation along with smaller phrase technology. Any transcriptional errors that result from this process are unintentional.

## 2019-03-13 LAB — BASIC METABOLIC PANEL
Anion gap: 11 (ref 5–15)
BUN: 54 mg/dL — ABNORMAL HIGH (ref 8–23)
CO2: 23 mmol/L (ref 22–32)
Calcium: 8.6 mg/dL — ABNORMAL LOW (ref 8.9–10.3)
Chloride: 105 mmol/L (ref 98–111)
Creatinine, Ser: 2.57 mg/dL — ABNORMAL HIGH (ref 0.44–1.00)
GFR calc Af Amer: 22 mL/min — ABNORMAL LOW (ref >60)
GFR calc non Af Amer: 19 mL/min — ABNORMAL LOW (ref >60)
Glucose, Bld: 161 mg/dL — ABNORMAL HIGH (ref 70–99)
Potassium: 4.4 mmol/L (ref 3.5–5.1)
Sodium: 139 mmol/L (ref 135–145)

## 2019-03-13 LAB — CBC WITH DIFFERENTIAL/PLATELET
Abs Immature Granulocytes: 0.01 10*3/uL (ref 0.00–0.07)
Basophils Absolute: 0 10*3/uL (ref 0.0–0.1)
Basophils Relative: 1 %
Eosinophils Absolute: 0.1 10*3/uL (ref 0.0–0.5)
Eosinophils Relative: 3 %
HCT: 35.5 % — ABNORMAL LOW (ref 36.0–46.0)
Hemoglobin: 11 g/dL — ABNORMAL LOW (ref 12.0–15.0)
Immature Granulocytes: 0 %
Lymphocytes Relative: 25 %
Lymphs Abs: 1 10*3/uL (ref 0.7–4.0)
MCH: 25.3 pg — ABNORMAL LOW (ref 26.0–34.0)
MCHC: 31 g/dL (ref 30.0–36.0)
MCV: 81.6 fL (ref 80.0–100.0)
Monocytes Absolute: 0.2 10*3/uL (ref 0.1–1.0)
Monocytes Relative: 6 %
Neutro Abs: 2.6 10*3/uL (ref 1.7–7.7)
Neutrophils Relative %: 65 %
Platelets: 160 10*3/uL (ref 150–400)
RBC: 4.35 MIL/uL (ref 3.87–5.11)
RDW: 18.1 % — ABNORMAL HIGH (ref 11.5–15.5)
WBC: 4 10*3/uL (ref 4.0–10.5)
nRBC: 0 % (ref 0.0–0.2)

## 2019-03-13 LAB — GLUCOSE, CAPILLARY
Glucose-Capillary: 153 mg/dL — ABNORMAL HIGH (ref 70–99)
Glucose-Capillary: 158 mg/dL — ABNORMAL HIGH (ref 70–99)

## 2019-03-13 MED ORDER — MECLIZINE HCL 12.5 MG PO TABS
12.5000 mg | ORAL_TABLET | Freq: Two times a day (BID) | ORAL | Status: DC
  Administered 2019-03-13: 12.5 mg via ORAL
  Filled 2019-03-13 (×2): qty 1

## 2019-03-13 MED ORDER — MECLIZINE HCL 12.5 MG PO TABS: 12.5000 mg | ORAL_TABLET | Freq: Two times a day (BID) | ORAL | 0 refills | Status: DC

## 2019-03-13 MED ORDER — FUROSEMIDE 40 MG PO TABS: 40.0000 mg | ORAL_TABLET | Freq: Two times a day (BID) | ORAL | 0 refills | Status: DC

## 2019-03-13 MED ORDER — FUROSEMIDE 40 MG PO TABS: 20.0000 mg | ORAL_TABLET | Freq: Two times a day (BID) | ORAL | 0 refills | Status: DC

## 2019-03-13 MED ORDER — OLMESARTAN MEDOXOMIL 40 MG PO TABS: 20.0000 mg | ORAL_TABLET | Freq: Every day | ORAL | 0 refills | Status: DC

## 2019-03-13 NOTE — Progress Notes (Signed)
[facility] to be D/C'd Home per MD order. Patient given discharge teaching and paperwork regarding medications, diet, follow-up appointments and activity. Patient understanding verbalized. No questions or complaints at this time. Skin condition as charted. IV and telemetry removed prior to leaving.  No further needs by Care Management/Social Work. Prescriptions e-prescribed to [facility].  An After Visit Summary was printed and given to the patient.   Patient escorted via wheelchair.  Terrilyn Saver

## 2019-03-13 NOTE — Discharge Summary (Addendum)
Priscilla Villanueva, is a 68 y.o. female  DOB 02/19/1951  MRN [id number].  Admission date:  03/11/2019  Admitting Physician  Christel Mormon, MD  Discharge Date:  03/13/2019   Primary MD  Tracie Harrier, MD  Recommendations for primary care physician for things to follow:   Follow with PCP in 1 week   Admission Diagnosis  Shortness of breath [R06.02] Leg swelling [M79.89] Elevated troponin [R77.8]   Discharge Diagnosis  Shortness of breath [R06.02] Leg swelling [M79.89] Elevated troponin [R77.8]    Active Problems:   Acute CHF (congestive heart failure) (HCC)      Past Medical History:  Diagnosis Date  . CAD (coronary artery disease)   . CHF (congestive heart failure) ([facility])   . Colon cancer ([place]) 2009   with chemotherapy  . Colon cancer ([place]) 11/26/2014  . Depression   . Diabetes mellitus without complication ([place])   . Hypercholesteremia   . Hypertension   . Obesity   . Personal history of chemotherapy 2009   colon ca    Past Surgical History:  Procedure Laterality Date  . ABDOMINAL HYSTERECTOMY     PARTIAL  . COLECTOMY    . COLONOSCOPY WITH PROPOFOL N/A 07/16/2017   Procedure: COLONOSCOPY WITH PROPOFOL;  Surgeon: Lollie Sails, MD;  Location: [facility] ENDOSCOPY;  Service: Endoscopy;  Laterality: N/A;  . PERIPHERAL VASCULAR CATHETERIZATION N/A 01/26/2015   Procedure: Glori Luis Cath Removal;  Surgeon: Katha Cabal, MD;  Location: [facility] CV LAB;  Service: Cardiovascular;  Laterality: N/A;  . UPPER GI ENDOSCOPY         History of present illness and  Hospital Course:     Kindly see H&P for history of present illness and admission details, please review complete Labs, Consult reports and Test reports for all details in brief  HPI  from the history and physical done on the day of  admission 68 year old female patient with history of diabetes mellitus type 2, diastolic heart failure, history of hyperaldosteronism came in because of shortness of breath, admitted for CHF exacerbation.   Hospital Course  : Acute respiratory failure on admission secondary to acute.  chronic diastolic heart failure, has evidence of BNP up to more than thousand, admitted to telemetry, started on IV diuretics, echocardiogram showed EF more than 50 to 55%.  Patient told me that she is taken off diuretics and started on the Benicar because of history of hyperaldosteronism, this is done by Dr. Gabriel Carina.  Patient says that she is not taking any Lasix for the past 1 month and noticed that she is feeling much better after starting IV Lasix here.  Told the patient she can take Lasix for 1 week and after that and resume Benicar at a lower dose and see PCP in 1 week and adjust diuretics accordingly.  Also on low-dose torsemide. 2.  Diabetes mellitus type 2 with long-term use of insulin and also history of CKD stage III with diabetic nephropathy.  Continue home dose insulin. 3.  Hyperaldosteronism, patient started on IV Lasix, feeling better, changed to oral Lasix and discharged home today. #4.  Dizziness, patient had work-up including ultrasound of carotids which is not showing any hemodynamically significant stenosis no orthostatic hypotension.  Patient had MRI of the brain last month showed old strokes, advised the patient to take aspirin and continue statins.  Also gave prescription for meclizine. Generalized anxiety, continue Xanax.  Patient told me that she takes Xanax for a long time. #5 shortness of  breath, patient had a PE study which is negative, ultrasound of legs also negative for DVT. #6. chronic right shoulder pain, patient started on Lidoderm patch by PCP. Discharge Condition: Stable   Follow UP  Follow-up Information    [facility] Follow up on  03/19/2019.   Specialty: Cardiology Why: at 11:00am. Please enter through the [place] entrance Contact information: [address] [place]       Tracie Harrier, MD. Schedule an appointment as soon as possible for a visit in 1 week.   Specialty: Internal Medicine Contact information: [place] [address] [phone]        Magnus Sinning, MD. Go in 1 week.   Specialty: Internal Medicine Contact information: [place] [address] [phone]             Discharge Instructions  and  Discharge Medications      Allergies as of 03/13/2019      Reactions   Amlodipine Swelling   Causes pt's feet and legs to swell...   Hydrochlorothiazide Other (See Comments)   Lisinopril Other (See Comments)   Sitagliptin Diarrhea   Zoloft [sertraline] Other (See Comments)      Medication List    STOP taking these medications   pravastatin 20 MG tablet Commonly known as: PRAVACHOL     TAKE these medications   allopurinol 300 MG tablet Commonly known as: ZYLOPRIM Take 300 mg by mouth daily.   ALPRAZolam 0.25 MG tablet Commonly known as: XANAX Take 0.25 mg by mouth at bedtime as needed for anxiety.   aspirin 81 MG EC tablet Take 81 mg by mouth daily. Swallow whole.   atorvastatin 40 MG tablet Commonly known as: LIPITOR Take 40 mg by mouth daily.   b complex vitamins capsule Take 1 capsule by mouth daily.   cloNIDine 0.3 MG tablet Commonly known as: CATAPRES 0.3 mg 3 (three) times daily.   colchicine 0.6 MG tablet Take 2 tablets by mouth as needed.   cyclobenzaprine 10 MG tablet Commonly known as: FLEXERIL Take 1 tablet (10 mg total) by mouth 3 (three) times daily as needed.   ferrous sulfate 325 (65 FE) MG EC tablet Take 325 mg by mouth every morning. Reported on 06/11/2015   furosemide 40 MG tablet Commonly known as: Lasix Take 0.5 tablets  (20 mg total) by mouth 2 (two) times daily for 7 days. What changed:   how much to take  when to take this   HumaLOG Mix 75/25 KwikPen (75-25) 100 UNIT/ML Kwikpen Generic drug: Insulin Lispro Prot & Lispro Inject 28 Units into the skin 2 (two) times daily.   hydrALAZINE 50 MG tablet Commonly known as: APRESOLINE Take 100 mg by mouth 3 (three) times daily.   insulin lispro 100 UNIT/ML injection Commonly known as: HUMALOG Inject 18 Units into the skin 2 (two) times daily before a meal. Reported on 06/11/2015   lidocaine 5 % Commonly known as: Lidoderm Place 1 patch onto the skin every 12 (twelve) hours. Remove & Discard patch within 12 hours or as directed by MD   liraglutide 18 MG/3ML Sopn Commonly known as: VICTOZA Inject 0.6 mg into the skin daily.   Magnesium 250 MG Tabs Take 2 tablets by mouth 2 (two) times daily.   magnesium oxide 400 MG tablet Commonly known as: MAG-OX Take 400 mg by mouth daily.   meclizine 12.5 MG  tablet Commonly known as: ANTIVERT Take 1 tablet (12.5 mg total) by mouth 2 (two) times daily.   metFORMIN 1000 MG tablet Commonly known as: GLUCOPHAGE Take 1,000 mg by mouth 2 (two) times daily with a meal.   metoprolol 200 MG 24 hr tablet Commonly known as: TOPROL-XL Take 200 mg by mouth daily.   olmesartan 40 MG tablet Commonly known as: BENICAR Take 0.5 tablets (20 mg total) by mouth daily. What changed: how much to take   polyethylene glycol 17 g packet Commonly known as: MIRALAX / GLYCOLAX Take 17 g by mouth daily.   potassium chloride 10 MEQ tablet Commonly known as: KLOR-CON TAKE ONE TABLET BY MOUTH TWICE DAILY   sertraline 50 MG tablet Commonly known as: ZOLOFT Take 50 mg by mouth daily.   spironolactone 25 MG tablet Commonly known as: ALDACTONE Take 25 mg by mouth daily.   torsemide 20 MG tablet Commonly known as: DEMADEX Take 20 mg by mouth daily.         Diet and Activity recommendation: See Discharge Instructions  above   Consults obtained -cardiology   Major procedures and Radiology Reports - PLEASE review detailed and final reports for all details, in brief -     Dg Shoulder Right  Result Date: 02/21/2019 CLINICAL DATA:  Right shoulder pain EXAM: RIGHT SHOULDER - 2+ VIEW COMPARISON:  None. FINDINGS: No fracture or dislocation of the right shoulder. Mild acromioclavicular and glenohumeral arthrosis. No radiographic abnormality of the partially included right chest. IMPRESSION: No fracture or dislocation of the right shoulder. Mild acromioclavicular and glenohumeral arthrosis. Electronically Signed   By: Eddie Candle M.D.   On: 02/21/2019 16:48   Mr Brain Wo Contrast  Result Date: 02/25/2019 CLINICAL DATA:  Leg weakness, bilateral, chronic kidney disease. Type 2 diabetes mellitus with stage 3 chronic kidney disease, with long-term current use of insulin. Poor balance. Recurrent falls. Additional history provided: Patient has experienced multiple falls for 1 month, blurry vision. EXAM: MRI HEAD WITHOUT CONTRAST TECHNIQUE: Multiplanar, multiecho pulse sequences of the brain and surrounding structures were obtained without intravenous contrast. COMPARISON:  Head CT 05/26/2013 FINDINGS: Brain: There is no convincing evidence of acute infarct. No evidence of intracranial mass. No midline shift or extra-axial fluid collection. No chronic intracranial blood products. Small, well-circumscribed symmetric foci of T2/FLAIR hyperintensity within the globus pallidus bilaterally. Mild scattered and confluent T2/FLAIR hyperintensity within the cerebral white matter consistent with chronic small vessel ischemic disease. Small chronic lacunar infarct versus prominent perivascular space within the anterior right thalamus. Cerebral volume is normal for age. Vascular: Flow voids maintained within the proximal large arterial vessels. Skull and upper cervical spine: No focal marrow lesion. Sinuses/Orbits: Visualized orbits  demonstrate no acute abnormality. Tiny left maxillary sinus mucous retention cyst. No significant mastoid effusion IMPRESSION: 1. No evidence of acute intracranial abnormality. 2. Mild chronic small vessel ischemic disease. 3. Small symmetric signal changes within the globus pallidus bilaterally are nonspecific, but consistent with sequela of remote insult. 4. Small chronic lacunar infarct versus prominent perivascular space within the anterior right thalamus. Electronically Signed   By: Kellie Simmering   On: 02/25/2019 19:38   US Carotid Bilateral  Result Date: 03/12/2019 CLINICAL DATA:  Dizziness, hypertension, hyperlipidemia EXAM: BILATERAL CAROTID DUPLEX ULTRASOUND TECHNIQUE: Pearline Cables scale imaging, color Doppler and duplex ultrasound were performed of bilateral carotid and vertebral arteries in the neck. COMPARISON:  None. FINDINGS: Criteria: Quantification of carotid stenosis is based on velocity parameters that correlate the residual internal carotid diameter  with NASCET-based stenosis levels, using the diameter of the distal internal carotid lumen as the denominator for stenosis measurement. The following velocity measurements were obtained: RIGHT ICA: 54/24 cm/sec CCA: 16/10 cm/sec SYSTOLIC ICA/CCA RATIO:  0.9 ECA: 41 cm/sec LEFT ICA: 62/24 cm/sec CCA: 96/0 cm/sec SYSTOLIC ICA/CCA RATIO:  1.2 ECA: 58 cm/sec RIGHT CAROTID ARTERY: Minor echogenic shadowing plaque formation. No hemodynamically significant right ICA stenosis, velocity elevation, or turbulent flow. Degree of narrowing less than 50%. RIGHT VERTEBRAL ARTERY:  Antegrade LEFT CAROTID ARTERY: Similar scattered minor echogenic plaque formation. No hemodynamically significant left ICA stenosis, velocity elevation, or turbulent flow. LEFT VERTEBRAL ARTERY:  Antegrade IMPRESSION: Minor carotid atherosclerosis. No hemodynamically significant ICA stenosis. Degree of narrowing less than 50% bilaterally by ultrasound criteria. Patent antegrade vertebral flow  bilaterally Electronically Signed   By: Jerilynn Mages.  Shick M.D.   On: 03/12/2019 16:19   Nm Pulmonary Perf And Vent  Result Date: 03/11/2019 CLINICAL DATA:  Concern for pulmonary embolism.  Short of breath. EXAM: NUCLEAR MEDICINE VENTILATION - PERFUSION LUNG SCAN TECHNIQUE: Ventilation images were obtained in multiple projections using inhaled aerosol Tc-21m DTPA. Perfusion images were obtained in multiple projections after intravenous injection of Tc-37m MAA. RADIOPHARMACEUTICALS:  31.3 mCi of Tc-55m DTPA aerosol inhalation and 4.3 mCi Tc61m MAA IV COMPARISON:  Chest radiograph 03/11/2019 FINDINGS: Ventilation: No focal ventilation defect. Perfusion: No wedge shaped peripheral perfusion defects to suggest acute pulmonary embolism. IMPRESSION: No evidence acute pulmonary embolism. Electronically Signed   By: Suzy Bouchard M.D.   On: 03/11/2019 17:10   US Venous Img Lower Bilateral  Result Date: 03/11/2019 CLINICAL DATA:  Bilateral leg swelling EXAM: BILATERAL LOWER EXTREMITY VENOUS DOPPLER ULTRASOUND TECHNIQUE: Gray-scale sonography with graded compression, as well as color Doppler and duplex ultrasound were performed to evaluate the lower extremity deep venous systems from the level of the common femoral vein and including the common femoral, femoral, profunda femoral, popliteal and calf veins including the posterior tibial, peroneal and gastrocnemius veins when visible. The superficial great saphenous vein was also interrogated. Spectral Doppler was utilized to evaluate flow at rest and with distal augmentation maneuvers in the common femoral, femoral and popliteal veins. COMPARISON:  None. FINDINGS: RIGHT LOWER EXTREMITY Common Femoral Vein: No evidence of thrombus. Normal compressibility, respiratory phasicity and response to augmentation. Saphenofemoral Junction: No evidence of thrombus. Profunda Femoral Vein: No evidence of thrombus. Femoral Vein: No evidence of thrombus. Popliteal Vein: No evidence of  thrombus. Calf Veins: No evidence of thrombus. LEFT LOWER EXTREMITY Common Femoral Vein: No evidence of thrombus. Saphenofemoral Junction: No evidence of thrombus. Profunda Femoral Vein: No evidence of thrombus. Femoral Vein: No evidence of thrombus. Popliteal Vein: No evidence of thrombus. Calf Veins: No evidence of thrombus. IMPRESSION: No evidence of deep venous thrombosis in either lower extremity. Electronically Signed   By: Monte Fantasia M.D.   On: 03/11/2019 07:05   Dg Chest Portable 1 View  Result Date: 03/11/2019 CLINICAL DATA:  Shortness of breath worsening for 1 week EXAM: PORTABLE CHEST 1 VIEW COMPARISON:  Radiograph 09/16/2017 CT 09/14/2011 FINDINGS: No consolidation, features of edema, pneumothorax, or effusion. Pulmonary vascularity is normally distributed. Prominence of the cardiac silhouette is likely related to the portable technique. No acute osseous or soft tissue abnormality. IMPRESSION: No acute cardiopulmonary disease. Electronically Signed   By: Lovena Le M.D.   On: 03/11/2019 03:33    Micro Results     Recent Results (from the past 240 hour(s))  SARS CORONAVIRUS 2 (TAT 6-24 HRS) Nasopharyngeal Nasopharyngeal  Swab     Status: None   Collection Time: 03/11/19  5:05 AM   Specimen: Nasopharyngeal Swab  Result Value Ref Range Status   SARS Coronavirus 2 NEGATIVE NEGATIVE Final    Comment: (NOTE) SARS-CoV-2 target nucleic acids are NOT DETECTED. The SARS-CoV-2 RNA is generally detectable in upper and lower respiratory specimens during the acute phase of infection. Negative results do not preclude SARS-CoV-2 infection, do not rule out co-infections with other pathogens, and should not be used as the sole basis for treatment or other patient management decisions. Negative results must be combined with clinical observations, patient history, and epidemiological information. The expected result is Negative. Fact Sheet for  Patients: SugarRoll.be Fact Sheet for Healthcare Providers: https://www.woods-mathews.com/ This test is not yet approved or cleared by the [place] FDA and  has been authorized for detection and/or diagnosis of SARS-CoV-2 by FDA under an Emergency Use Authorization (EUA). This EUA will remain  in effect (meaning this test can be used) for the duration of the COVID-19 declaration under Section 56 4(b)(1) of the Act, 21 U.S.C. section 360bbb-3(b)(1), unless the authorization is terminated or revoked sooner. Performed at [facility], [place] [address], [place], [place] [address]        Today   Subjective:   Priscilla Villanueva today has no headache,no chest abdominal pain,no new weakness tingling or numbness, feels much better wants to go home today.   Objective:   Blood pressure (!) 164/82, pulse 64, temperature 97.8 F (36.6 C), temperature source Oral, resp. rate 17, height 5\' 4"  (1.626 m), weight 116.3 kg, SpO2 98 %.   Intake/Output Summary (Last 24 hours) at 03/13/2019 1027 Last data filed at 03/13/2019 0900 Gross per 24 hour  Intake 240 ml  Output 350 ml  Net -110 ml    Exam Awake Alert, Oriented x 3, No new F.N deficits, Normal affect [place].AT,PERRAL Supple Neck,No JVD, No cervical lymphadenopathy appriciated.  Symmetrical Chest wall movement, Good air movement bilaterally, CTAB RRR,No Gallops,Rubs or new Murmurs, No Parasternal Heave +ve B.Sounds, Abd Soft, Non tender, No organomegaly appriciated, No rebound -guarding or rigidity. No Cyanosis, Clubbing or edema, No new Rash or bruise  Data Review   CBC w Diff:  Lab Results  Component Value Date   WBC 4.0 03/13/2019   HGB 11.0 (L) 03/13/2019   HGB 11.6 (L) 04/13/2014   HCT 35.5 (L) 03/13/2019   HCT 39.2 12/08/2013   PLT 160 03/13/2019   PLT 169 12/08/2013   LYMPHOPCT 25 03/13/2019   LYMPHOPCT 23.9 12/08/2013   MONOPCT 6 03/13/2019   MONOPCT 6.0 12/08/2013    EOSPCT 3 03/13/2019   EOSPCT 0.7 12/08/2013   BASOPCT 1 03/13/2019   BASOPCT 0.5 12/08/2013    CMP:  Lab Results  Component Value Date   NA 139 03/13/2019   NA 134 (L) 05/28/2013   K 4.4 03/13/2019   K 3.5 05/28/2013   CL 105 03/13/2019   CL 99 05/28/2013   CO2 23 03/13/2019   CO2 25 05/28/2013   BUN 54 (H) 03/13/2019   BUN 30 (H) 05/28/2013   CREATININE 2.57 (H) 03/13/2019   CREATININE 1.40 (H) 05/28/2013   PROT 5.7 (L) 03/11/2019   PROT 7.2 06/23/2013   ALBUMIN 3.1 (L) 03/11/2019   ALBUMIN 3.4 06/23/2013   BILITOT 0.7 03/11/2019   BILITOT 0.2 06/23/2013   ALKPHOS 110 03/11/2019   ALKPHOS 65 06/23/2013   AST 43 (H) 03/11/2019   AST 39 (H) 06/23/2013  ALT 37 03/11/2019   ALT 83 (H) 06/23/2013  .   Total Time in preparing paper work, data evaluation and todays exam - 35 minutes  Epifanio Lesches M.D on 03/13/2019 at 10:27 AM    Note: This dictation was prepared with Dragon dictation along with smaller phrase technology. Any transcriptional errors that result from this process are unintentional.

## 2019-03-17 ENCOUNTER — Ambulatory Visit
Admission: RE | Admit: 2019-03-17 | Discharge: 2019-03-17 | Disposition: A | Discharge status: Home / Self Care | Payer: Medicare Other | Source: Ambulatory Visit | Attending: Student | Admitting: Student

## 2019-03-17 ENCOUNTER — Other Ambulatory Visit: Payer: Self-pay

## 2019-03-17 DIAGNOSIS — S46011A Strain of muscle(s) and tendon(s) of the rotator cuff of right shoulder, initial encounter: Secondary | ICD-10-CM | POA: Diagnosis present

## 2019-03-19 ENCOUNTER — Telehealth: Payer: Self-pay | Admitting: Family

## 2019-03-19 ENCOUNTER — Ambulatory Visit: Payer: Medicare Other | Admitting: Family

## 2019-03-19 NOTE — Progress Notes (Deleted)
Patient ID: Priscilla Villanueva, female    DOB: 04/20/1951, 68 y.o.   MRN: [id number]  HPI  Priscilla Villanueva is a 67 year old female with a medical history of CHF, CKD stage 3, hypertension, diabetes, colon cancer, and depression.   Echo reviewed from 03/11/2019 and shows an EF of 50-55% with severely elevated pulmonary artery systolic pressure.   She was admitted 03/11/2019 for a heart failure exacerbation. She was diuresed with IV lasix and discharged after 2 days. She went to the ED 02/21/2019 for right shoulder pain post fall and was treated and released.   She presents today for an initial visit with a chief complaint of   Review of Systems    Physical Exam    Assessment & Plan  1. Heart failure with preserved ejection fraction: - NYHA class - Instructed to weigh daily and call for a weight gain >2 pounds overnight or 5 pounds in a week - BNP 03/11/2019 1,048.0 - saw cardiology Priscilla Villanueva) 11/20/2018  2. Hypertension: - blood pressure - saw PCP (Priscilla Villanueva) 02/14/2019 - BMP from 03/13/2019 reviewed and shows sodium 139, potassium 4.4, creatinine 2.57, and GFR 22  3. Diabetes: - blood sugar today - A1C from 03/11/2019 7.2%

## 2019-03-19 NOTE — Telephone Encounter (Signed)
Patient did not show for her [facility] appointment on 03/19/2019. Will attempt to reschedule.

## 2019-03-20 ENCOUNTER — Other Ambulatory Visit
Admission: RE | Admit: 2019-03-20 | Discharge: 2019-03-20 | Disposition: A | Discharge status: Home / Self Care | Payer: Medicare Other | Source: Ambulatory Visit | Attending: Internal Medicine | Admitting: Internal Medicine

## 2019-03-20 DIAGNOSIS — I2510 Atherosclerotic heart disease of native coronary artery without angina pectoris: Secondary | ICD-10-CM | POA: Diagnosis not present

## 2019-03-20 DIAGNOSIS — Z9221 Personal history of antineoplastic chemotherapy: Secondary | ICD-10-CM | POA: Diagnosis not present

## 2019-03-20 DIAGNOSIS — Z87891 Personal history of nicotine dependence: Secondary | ICD-10-CM | POA: Diagnosis not present

## 2019-03-20 DIAGNOSIS — I110 Hypertensive heart disease with heart failure: Secondary | ICD-10-CM | POA: Diagnosis not present

## 2019-03-20 DIAGNOSIS — Z79899 Other long term (current) drug therapy: Secondary | ICD-10-CM | POA: Diagnosis not present

## 2019-03-20 DIAGNOSIS — I5032 Chronic diastolic (congestive) heart failure: Secondary | ICD-10-CM | POA: Insufficient documentation

## 2019-03-20 DIAGNOSIS — Z85038 Personal history of other malignant neoplasm of large intestine: Secondary | ICD-10-CM | POA: Diagnosis not present

## 2019-03-20 DIAGNOSIS — Z01812 Encounter for preprocedural laboratory examination: Secondary | ICD-10-CM | POA: Diagnosis present

## 2019-03-20 DIAGNOSIS — E119 Type 2 diabetes mellitus without complications: Secondary | ICD-10-CM | POA: Insufficient documentation

## 2019-03-20 LAB — BRAIN NATRIURETIC PEPTIDE: B Natriuretic Peptide: 939 pg/mL — ABNORMAL HIGH (ref 0.0–100.0)

## 2019-04-01 ENCOUNTER — Other Ambulatory Visit: Payer: Self-pay

## 2019-04-01 ENCOUNTER — Other Ambulatory Visit
Admission: RE | Admit: 2019-04-01 | Discharge: 2019-04-01 | Disposition: A | Discharge status: Home / Self Care | Payer: Medicare Other | Source: Ambulatory Visit | Attending: Internal Medicine | Admitting: Internal Medicine

## 2019-04-01 DIAGNOSIS — Z01812 Encounter for preprocedural laboratory examination: Secondary | ICD-10-CM | POA: Diagnosis present

## 2019-04-01 DIAGNOSIS — Z20828 Contact with and (suspected) exposure to other viral communicable diseases: Secondary | ICD-10-CM | POA: Insufficient documentation

## 2019-04-01 LAB — SARS CORONAVIRUS 2 (TAT 6-24 HRS): SARS Coronavirus 2: NEGATIVE

## 2019-04-02 ENCOUNTER — Encounter
Admission: RE | Disposition: A | Discharge status: Home / Self Care | Payer: Self-pay | Source: Home / Self Care | Attending: Internal Medicine

## 2019-04-02 ENCOUNTER — Ambulatory Visit
Admission: RE | Admit: 2019-04-02 | Discharge: 2019-04-02 | Disposition: A | Discharge status: Home / Self Care | Payer: Medicare Other | Attending: Internal Medicine | Admitting: Internal Medicine

## 2019-04-02 ENCOUNTER — Other Ambulatory Visit: Payer: Self-pay

## 2019-04-02 DIAGNOSIS — I2510 Atherosclerotic heart disease of native coronary artery without angina pectoris: Secondary | ICD-10-CM | POA: Insufficient documentation

## 2019-04-02 DIAGNOSIS — N2581 Secondary hyperparathyroidism of renal origin: Secondary | ICD-10-CM | POA: Insufficient documentation

## 2019-04-02 DIAGNOSIS — I129 Hypertensive chronic kidney disease with stage 1 through stage 4 chronic kidney disease, or unspecified chronic kidney disease: Secondary | ICD-10-CM | POA: Diagnosis not present

## 2019-04-02 DIAGNOSIS — R609 Edema, unspecified: Secondary | ICD-10-CM | POA: Diagnosis not present

## 2019-04-02 DIAGNOSIS — I272 Pulmonary hypertension, unspecified: Secondary | ICD-10-CM | POA: Insufficient documentation

## 2019-04-02 DIAGNOSIS — E559 Vitamin D deficiency, unspecified: Secondary | ICD-10-CM | POA: Diagnosis not present

## 2019-04-02 DIAGNOSIS — E669 Obesity, unspecified: Secondary | ICD-10-CM | POA: Insufficient documentation

## 2019-04-02 DIAGNOSIS — I509 Heart failure, unspecified: Secondary | ICD-10-CM

## 2019-04-02 DIAGNOSIS — Z85038 Personal history of other malignant neoplasm of large intestine: Secondary | ICD-10-CM | POA: Diagnosis not present

## 2019-04-02 DIAGNOSIS — N1831 Chronic kidney disease, stage 3a: Secondary | ICD-10-CM | POA: Insufficient documentation

## 2019-04-02 DIAGNOSIS — Z7982 Long term (current) use of aspirin: Secondary | ICD-10-CM | POA: Diagnosis not present

## 2019-04-02 DIAGNOSIS — E1122 Type 2 diabetes mellitus with diabetic chronic kidney disease: Secondary | ICD-10-CM | POA: Diagnosis not present

## 2019-04-02 DIAGNOSIS — I5032 Chronic diastolic (congestive) heart failure: Secondary | ICD-10-CM | POA: Diagnosis not present

## 2019-04-02 DIAGNOSIS — Z87891 Personal history of nicotine dependence: Secondary | ICD-10-CM | POA: Insufficient documentation

## 2019-04-02 DIAGNOSIS — F329 Major depressive disorder, single episode, unspecified: Secondary | ICD-10-CM | POA: Insufficient documentation

## 2019-04-02 DIAGNOSIS — Z79899 Other long term (current) drug therapy: Secondary | ICD-10-CM | POA: Diagnosis not present

## 2019-04-02 DIAGNOSIS — G4733 Obstructive sleep apnea (adult) (pediatric): Secondary | ICD-10-CM | POA: Diagnosis not present

## 2019-04-02 DIAGNOSIS — Z794 Long term (current) use of insulin: Secondary | ICD-10-CM | POA: Insufficient documentation

## 2019-04-02 DIAGNOSIS — E785 Hyperlipidemia, unspecified: Secondary | ICD-10-CM | POA: Insufficient documentation

## 2019-04-02 DIAGNOSIS — Z6841 Body Mass Index (BMI) 40.0 and over, adult: Secondary | ICD-10-CM | POA: Insufficient documentation

## 2019-04-02 HISTORY — PX: RIGHT HEART CATH AND CORONARY ANGIOGRAPHY: CATH118264

## 2019-04-02 LAB — GLUCOSE, CAPILLARY
Glucose-Capillary: 162 mg/dL — ABNORMAL HIGH (ref 70–99)
Glucose-Capillary: 128 mg/dL — ABNORMAL HIGH (ref 70–99)

## 2019-04-02 SURGERY — RIGHT HEART CATH AND CORONARY ANGIOGRAPHY
Anesthesia: Moderate Sedation | Laterality: Right

## 2019-04-02 MED ORDER — SODIUM CHLORIDE 0.9% FLUSH: 3.0000 mL | INTRAVENOUS | Status: DC | PRN

## 2019-04-02 MED ORDER — FENTANYL CITRATE (PF) 100 MCG/2ML IJ SOLN
INTRAMUSCULAR | Status: DC | PRN
  Administered 2019-04-02: 25 ug via INTRAVENOUS

## 2019-04-02 MED ORDER — SODIUM CHLORIDE 0.9% FLUSH: 3.0000 mL | Freq: Two times a day (BID) | INTRAVENOUS | Status: DC

## 2019-04-02 MED ORDER — FENTANYL CITRATE (PF) 100 MCG/2ML IJ SOLN
INTRAMUSCULAR | Status: AC
  Filled 2019-04-02: qty 2

## 2019-04-02 MED ORDER — HEPARIN (PORCINE) IN NACL 1000-0.9 UT/500ML-% IV SOLN
INTRAVENOUS | Status: AC
  Filled 2019-04-02: qty 1000

## 2019-04-02 MED ORDER — ASPIRIN 81 MG PO CHEW
81.0000 mg | CHEWABLE_TABLET | ORAL | Status: AC
  Administered 2019-04-02: 08:00:00 81 mg via ORAL

## 2019-04-02 MED ORDER — SODIUM CHLORIDE 0.9 % IV SOLN: 250.0000 mL | INTRAVENOUS | Status: DC | PRN

## 2019-04-02 MED ORDER — ASPIRIN 81 MG PO CHEW
CHEWABLE_TABLET | ORAL | Status: AC
  Filled 2019-04-02: qty 1

## 2019-04-02 MED ORDER — HYDRALAZINE HCL 20 MG/ML IJ SOLN: 10.0000 mg | INTRAMUSCULAR | Status: DC | PRN

## 2019-04-02 MED ORDER — SODIUM CHLORIDE 0.9 % WEIGHT BASED INFUSION: 3.0000 mL/kg/h | INTRAVENOUS | Status: DC

## 2019-04-02 MED ORDER — MIDAZOLAM HCL 2 MG/2ML IJ SOLN
INTRAMUSCULAR | Status: AC
  Filled 2019-04-02: qty 2

## 2019-04-02 MED ORDER — MIDAZOLAM HCL 2 MG/2ML IJ SOLN
INTRAMUSCULAR | Status: DC | PRN
  Administered 2019-04-02: 1 mg via INTRAVENOUS

## 2019-04-02 MED ORDER — ACETAMINOPHEN 325 MG PO TABS: 650.0000 mg | ORAL_TABLET | ORAL | Status: DC | PRN

## 2019-04-02 MED ORDER — ONDANSETRON HCL 4 MG/2ML IJ SOLN: 4.0000 mg | Freq: Four times a day (QID) | INTRAMUSCULAR | Status: DC | PRN

## 2019-04-02 MED ORDER — HEPARIN (PORCINE) IN NACL 1000-0.9 UT/500ML-% IV SOLN
INTRAVENOUS | Status: DC | PRN
  Administered 2019-04-02: 500 mL

## 2019-04-02 MED ORDER — LABETALOL HCL 5 MG/ML IV SOLN: 10.0000 mg | INTRAVENOUS | Status: DC | PRN

## 2019-04-02 MED ORDER — SODIUM CHLORIDE 0.9 % WEIGHT BASED INFUSION: 1.0000 mL/kg/h | INTRAVENOUS | Status: DC

## 2019-04-02 MED ORDER — SODIUM CHLORIDE 0.9 % IV SOLN
INTRAVENOUS | Status: DC
  Administered 2019-04-02: 08:00:00 via INTRAVENOUS

## 2019-04-02 SURGICAL SUPPLY — 8 items
CATH SWANZ 7F THERMO (CATHETERS) ×2 IMPLANT
GUIDEWIRE EMER 3M J .025X150CM (WIRE) ×2 IMPLANT
KIT MANI 3VAL PERCEP (MISCELLANEOUS) ×3 IMPLANT
KIT RIGHT HEART (MISCELLANEOUS) ×3 IMPLANT
NDL PERC 18GX7CM (NEEDLE) IMPLANT
NEEDLE PERC 18GX7CM (NEEDLE) ×3 IMPLANT
PACK CARDIAC CATH (CUSTOM PROCEDURE TRAY) ×3 IMPLANT
SHEATH AVANTI 7FRX11 (SHEATH) ×2 IMPLANT

## 2019-04-02 NOTE — Discharge Instructions (Signed)
Coronary Angiogram Coronary angiogram with stent placement is a procedure to widen or open a narrow blood vessel of the heart (coronary artery). Arteries may become blocked by cholesterol buildup (plaques) in the lining of the wall. When a coronary artery becomes partially blocked, blood flow to that area decreases. This may lead to chest pain or a heart attack (myocardial infarction). A stent is a small piece of metal that looks like mesh or a spring. Stent placement may be done as treatment for a heart attack or right after a coronary angiogram in which a blocked artery is found. Let your health care provider know about:  Any allergies you have.  All medicines you are taking, including vitamins, herbs, eye drops, creams, and over-the-counter medicines.  Any problems you or family members have had with anesthetic medicines.  Any blood disorders you have.  Any surgeries you have had.  Any medical conditions you have.  Whether you are pregnant or may be pregnant. What are the risks? Generally, this is a safe procedure. However, problems may occur, including:  Damage to the heart or its blood vessels.  A return of blockage.  Bleeding, infection, or bruising at the insertion site.  A collection of blood under the skin (hematoma) at the insertion site.  A blood clot in another part of the body.  Kidney injury.  Allergic reaction to the dye or contrast that is used.  Bleeding into the abdomen (retroperitoneal bleeding). What happens before the procedure? Staying hydrated Follow instructions from your health care provider about hydration, which may include:  Up to 2 hours before the procedure - you may continue to drink clear liquids, such as water, clear fruit juice, black coffee, and plain tea.  Eating and drinking restrictions Follow instructions from your health care provider about eating and drinking, which may include:  8 hours before the procedure - stop eating heavy  meals or foods such as meat, fried foods, or fatty foods.  6 hours before the procedure - stop eating light meals or foods, such as toast or cereal.  2 hours before the procedure - stop drinking clear liquids. Ask your health care provider about:  Changing or stopping your regular medicines. This is especially important if you are taking diabetes medicines or blood thinners.  Taking medicines such as ibuprofen. These medicines can thin your blood. Do not take these medicines before your procedure if your health care provider instructs you not to. Generally, aspirin is recommended before a procedure of passing a small, thin tube (catheter) through a blood vessel and into the heart (cardiac catheterization). What happens during the procedure?   An IV tube will be inserted into one of your veins.  You will be given one or more of the following: ? A medicine to help you relax (sedative). ? A medicine to numb the area where the catheter will be inserted into an artery (local anesthetic).  To reduce your risk of infection: ? Your health care team will wash or sanitize their hands. ? Your skin will be washed with soap. ? Hair may be removed from the area where the catheter will be inserted.  Using a guide wire, the catheter will be inserted into an artery. The location may be in your groin, in your wrist, or in the fold of your arm (near your elbow).  A type of X-ray (fluoroscopy) will be used to help guide the catheter to the opening of the arteries in the heart.  A dye will be  injected into the catheter, and X-rays will be taken. The dye will help to show where any narrowing or blockages are located in the arteries.  A tiny wire will be guided to the blocked spot, and a balloon will be inflated to make the artery wider.  The stent will be expanded and will crush the plaques into the wall of the vessel. The stent will hold the area open and improve the blood flow. Most stents have a drug  coating to reduce the risk of the stent narrowing over time.  The artery may be made wider using a drill, laser, or other tools to remove plaques.  When the blood flow is better, the catheter will be removed. The lining of the artery will grow over the stent, which stays where it was placed. This procedure may vary among health care providers and hospitals. What happens after the procedure?  If the procedure is done through the leg, you will be kept in bed lying flat for about 6 hours. You will be instructed to not bend and not cross your legs.  The insertion site will be checked frequently.  The pulse in your foot or wrist will be checked frequently.  You may have additional blood tests, X-rays, and a test that records the electrical activity of your heart (electrocardiogram, or ECG). This information is not intended to replace advice given to you by your health care provider. Make sure you discuss any questions you have with your health care provider. Document Released: 11/19/2002 Document Revised: 08/24/2017 Document Reviewed: 12/19/2015 Elsevier Patient Education  [place].      Moderate Conscious Sedation, Adult, Care After These instructions provide you with information about caring for yourself after your procedure. Your health care provider may also give you more specific instructions. Your treatment has been planned according to current medical practices, but problems sometimes occur. Call your health care provider if you have any problems or questions after your procedure. What can I expect after the procedure? After your procedure, it is common:  To feel sleepy for several hours.  To feel clumsy and have poor balance for several hours.  To have poor judgment for several hours.  To vomit if you eat too soon. Follow these instructions at home: For at least 24 hours after the procedure:   Do not: ? Participate in activities where you could fall or become  injured. ? Drive. ? Use heavy machinery. ? Drink alcohol. ? Take sleeping pills or medicines that cause drowsiness. ? Make important decisions or sign legal documents. ? Take care of children on your own.  Rest. Eating and drinking  Follow the diet recommended by your health care provider.  If you vomit: ? Drink water, juice, or soup when you can drink without vomiting. ? Make sure you have little or no nausea before eating solid foods. General instructions  Have a responsible adult stay with you until you are awake and alert.  Take over-the-counter and prescription medicines only as told by your health care provider.  If you smoke, do not smoke without supervision.  Keep all follow-up visits as told by your health care provider. This is important. Contact a health care provider if:  You keep feeling nauseous or you keep vomiting.  You feel light-headed.  You develop a rash.  You have a fever. Get help right away if:  You have trouble breathing. This information is not intended to replace advice given to you by your health care provider.  Make sure you discuss any questions you have with your health care provider. Document Released: 03/05/2013 Document Revised: 04/27/2017 Document Reviewed: 09/04/2015 Elsevier Patient Education  2020 [employer].

## 2019-04-07 ENCOUNTER — Other Ambulatory Visit: Payer: Medicare Other

## 2019-04-09 ENCOUNTER — Ambulatory Visit: Payer: Medicare Other

## 2019-04-09 DIAGNOSIS — R5383 Other fatigue: Secondary | ICD-10-CM | POA: Insufficient documentation

## 2019-04-09 DIAGNOSIS — G4733 Obstructive sleep apnea (adult) (pediatric): Secondary | ICD-10-CM | POA: Insufficient documentation

## 2019-04-10 ENCOUNTER — Other Ambulatory Visit (INDEPENDENT_AMBULATORY_CARE_PROVIDER_SITE_OTHER): Payer: Self-pay | Admitting: Vascular Surgery

## 2019-04-10 ENCOUNTER — Inpatient Hospital Stay
Admission: AD | Admit: 2019-04-10 | Discharge: 2019-04-17 | DRG: 291 | Disposition: A | Discharge status: Home Health | Payer: Medicare Other | Source: Ambulatory Visit | Attending: Internal Medicine | Admitting: Internal Medicine

## 2019-04-10 ENCOUNTER — Other Ambulatory Visit: Payer: Self-pay

## 2019-04-10 DIAGNOSIS — N1832 Chronic kidney disease, stage 3b: Secondary | ICD-10-CM | POA: Diagnosis present

## 2019-04-10 DIAGNOSIS — K219 Gastro-esophageal reflux disease without esophagitis: Secondary | ICD-10-CM | POA: Diagnosis present

## 2019-04-10 DIAGNOSIS — I272 Pulmonary hypertension, unspecified: Secondary | ICD-10-CM | POA: Diagnosis present

## 2019-04-10 DIAGNOSIS — I5033 Acute on chronic diastolic (congestive) heart failure: Secondary | ICD-10-CM | POA: Diagnosis present

## 2019-04-10 DIAGNOSIS — E785 Hyperlipidemia, unspecified: Secondary | ICD-10-CM | POA: Diagnosis present

## 2019-04-10 DIAGNOSIS — Z800 Family history of malignant neoplasm of digestive organs: Secondary | ICD-10-CM | POA: Diagnosis not present

## 2019-04-10 DIAGNOSIS — E1129 Type 2 diabetes mellitus with other diabetic kidney complication: Secondary | ICD-10-CM | POA: Diagnosis present

## 2019-04-10 DIAGNOSIS — E1122 Type 2 diabetes mellitus with diabetic chronic kidney disease: Secondary | ICD-10-CM | POA: Diagnosis present

## 2019-04-10 DIAGNOSIS — N183 Chronic kidney disease, stage 3 unspecified: Secondary | ICD-10-CM | POA: Diagnosis present

## 2019-04-10 DIAGNOSIS — Z6841 Body Mass Index (BMI) 40.0 and over, adult: Secondary | ICD-10-CM

## 2019-04-10 DIAGNOSIS — E876 Hypokalemia: Secondary | ICD-10-CM | POA: Diagnosis not present

## 2019-04-10 DIAGNOSIS — N179 Acute kidney failure, unspecified: Secondary | ICD-10-CM | POA: Diagnosis present

## 2019-04-10 DIAGNOSIS — M109 Gout, unspecified: Secondary | ICD-10-CM | POA: Diagnosis present

## 2019-04-10 DIAGNOSIS — Z87891 Personal history of nicotine dependence: Secondary | ICD-10-CM | POA: Diagnosis not present

## 2019-04-10 DIAGNOSIS — I5032 Chronic diastolic (congestive) heart failure: Secondary | ICD-10-CM | POA: Diagnosis present

## 2019-04-10 DIAGNOSIS — E1159 Type 2 diabetes mellitus with other circulatory complications: Secondary | ICD-10-CM | POA: Diagnosis present

## 2019-04-10 DIAGNOSIS — Z79899 Other long term (current) drug therapy: Secondary | ICD-10-CM

## 2019-04-10 DIAGNOSIS — Z9049 Acquired absence of other specified parts of digestive tract: Secondary | ICD-10-CM

## 2019-04-10 DIAGNOSIS — I10 Essential (primary) hypertension: Secondary | ICD-10-CM | POA: Diagnosis not present

## 2019-04-10 DIAGNOSIS — E8770 Fluid overload, unspecified: Secondary | ICD-10-CM | POA: Diagnosis present

## 2019-04-10 DIAGNOSIS — I152 Hypertension secondary to endocrine disorders: Secondary | ICD-10-CM | POA: Diagnosis present

## 2019-04-10 DIAGNOSIS — Z90710 Acquired absence of both cervix and uterus: Secondary | ICD-10-CM | POA: Diagnosis not present

## 2019-04-10 DIAGNOSIS — E1121 Type 2 diabetes mellitus with diabetic nephropathy: Secondary | ICD-10-CM | POA: Diagnosis not present

## 2019-04-10 DIAGNOSIS — I130 Hypertensive heart and chronic kidney disease with heart failure and stage 1 through stage 4 chronic kidney disease, or unspecified chronic kidney disease: Principal | ICD-10-CM | POA: Diagnosis present

## 2019-04-10 DIAGNOSIS — Z9221 Personal history of antineoplastic chemotherapy: Secondary | ICD-10-CM | POA: Diagnosis not present

## 2019-04-10 DIAGNOSIS — E669 Obesity, unspecified: Secondary | ICD-10-CM | POA: Diagnosis present

## 2019-04-10 DIAGNOSIS — E8809 Other disorders of plasma-protein metabolism, not elsewhere classified: Secondary | ICD-10-CM | POA: Diagnosis not present

## 2019-04-10 DIAGNOSIS — Z20828 Contact with and (suspected) exposure to other viral communicable diseases: Secondary | ICD-10-CM | POA: Diagnosis present

## 2019-04-10 DIAGNOSIS — I2721 Secondary pulmonary arterial hypertension: Secondary | ICD-10-CM | POA: Diagnosis not present

## 2019-04-10 DIAGNOSIS — I2510 Atherosclerotic heart disease of native coronary artery without angina pectoris: Secondary | ICD-10-CM | POA: Diagnosis present

## 2019-04-10 DIAGNOSIS — N1831 Chronic kidney disease, stage 3a: Secondary | ICD-10-CM | POA: Diagnosis not present

## 2019-04-10 DIAGNOSIS — Z85038 Personal history of other malignant neoplasm of large intestine: Secondary | ICD-10-CM

## 2019-04-10 DIAGNOSIS — R0902 Hypoxemia: Secondary | ICD-10-CM | POA: Diagnosis not present

## 2019-04-10 DIAGNOSIS — E7800 Pure hypercholesterolemia, unspecified: Secondary | ICD-10-CM | POA: Diagnosis present

## 2019-04-10 DIAGNOSIS — E1169 Type 2 diabetes mellitus with other specified complication: Secondary | ICD-10-CM | POA: Diagnosis present

## 2019-04-10 DIAGNOSIS — E269 Hyperaldosteronism, unspecified: Secondary | ICD-10-CM | POA: Diagnosis present

## 2019-04-10 DIAGNOSIS — Z794 Long term (current) use of insulin: Secondary | ICD-10-CM | POA: Diagnosis not present

## 2019-04-10 DIAGNOSIS — E8779 Other fluid overload: Secondary | ICD-10-CM | POA: Diagnosis not present

## 2019-04-10 DIAGNOSIS — I509 Heart failure, unspecified: Secondary | ICD-10-CM

## 2019-04-10 DIAGNOSIS — Z7982 Long term (current) use of aspirin: Secondary | ICD-10-CM

## 2019-04-10 DIAGNOSIS — J810 Acute pulmonary edema: Secondary | ICD-10-CM | POA: Diagnosis not present

## 2019-04-10 DIAGNOSIS — J811 Chronic pulmonary edema: Secondary | ICD-10-CM

## 2019-04-10 DIAGNOSIS — Z888 Allergy status to other drugs, medicaments and biological substances status: Secondary | ICD-10-CM

## 2019-04-10 LAB — CBC
HCT: 37.9 % (ref 36.0–46.0)
Hemoglobin: 11.6 g/dL — ABNORMAL LOW (ref 12.0–15.0)
MCH: 25.1 pg — ABNORMAL LOW (ref 26.0–34.0)
MCHC: 30.6 g/dL (ref 30.0–36.0)
MCV: 82 fL (ref 80.0–100.0)
Platelets: 148 10*3/uL — ABNORMAL LOW (ref 150–400)
RBC: 4.62 MIL/uL (ref 3.87–5.11)
RDW: 19.7 % — ABNORMAL HIGH (ref 11.5–15.5)
WBC: 5.6 10*3/uL (ref 4.0–10.5)
nRBC: 0 % (ref 0.0–0.2)

## 2019-04-10 LAB — BASIC METABOLIC PANEL
Anion gap: 12 (ref 5–15)
BUN: 22 mg/dL (ref 8–23)
CO2: 28 mmol/L (ref 22–32)
Calcium: 8.6 mg/dL — ABNORMAL LOW (ref 8.9–10.3)
Chloride: 105 mmol/L (ref 98–111)
Creatinine, Ser: 1.45 mg/dL — ABNORMAL HIGH (ref 0.44–1.00)
GFR calc Af Amer: 43 mL/min — ABNORMAL LOW (ref >60)
GFR calc non Af Amer: 37 mL/min — ABNORMAL LOW (ref >60)
Glucose, Bld: 203 mg/dL — ABNORMAL HIGH (ref 70–99)
Potassium: 3.4 mmol/L — ABNORMAL LOW (ref 3.5–5.1)
Sodium: 145 mmol/L (ref 135–145)

## 2019-04-10 LAB — GLUCOSE, CAPILLARY: Glucose-Capillary: 222 mg/dL — ABNORMAL HIGH (ref 70–99)

## 2019-04-10 MED ORDER — ATORVASTATIN CALCIUM 20 MG PO TABS
40.0000 mg | ORAL_TABLET | Freq: Every evening | ORAL | Status: DC
  Administered 2019-04-10 – 2019-04-17 (×7): 40 mg via ORAL
  Filled 2019-04-10 (×7): qty 2

## 2019-04-10 MED ORDER — ONDANSETRON HCL 4 MG/2ML IJ SOLN: 4.0000 mg | Freq: Four times a day (QID) | INTRAMUSCULAR | Status: DC | PRN

## 2019-04-10 MED ORDER — SODIUM CHLORIDE 0.9% FLUSH
3.0000 mL | Freq: Two times a day (BID) | INTRAVENOUS | Status: DC
  Administered 2019-04-10 – 2019-04-17 (×9): 3 mL via INTRAVENOUS

## 2019-04-10 MED ORDER — ACETAMINOPHEN 650 MG RE SUPP: 650.0000 mg | Freq: Four times a day (QID) | RECTAL | Status: DC | PRN

## 2019-04-10 MED ORDER — METOPROLOL SUCCINATE ER 100 MG PO TB24
200.0000 mg | ORAL_TABLET | Freq: Every day | ORAL | Status: DC
  Administered 2019-04-11 – 2019-04-17 (×6): 200 mg via ORAL
  Filled 2019-04-10 (×7): qty 2

## 2019-04-10 MED ORDER — POTASSIUM CHLORIDE 20 MEQ/15ML (10%) PO SOLN
40.0000 meq | Freq: Once | ORAL | Status: AC
  Administered 2019-04-10: 40 meq via ORAL
  Filled 2019-04-10: qty 30

## 2019-04-10 MED ORDER — INSULIN ASPART PROT & ASPART (70-30 MIX) 100 UNIT/ML ~~LOC~~ SUSP
14.0000 [IU] | Freq: Two times a day (BID) | SUBCUTANEOUS | Status: DC
  Administered 2019-04-11 – 2019-04-17 (×10): 14 [IU] via SUBCUTANEOUS
  Filled 2019-04-10 (×10): qty 10

## 2019-04-10 MED ORDER — HYDRALAZINE HCL 50 MG PO TABS
100.0000 mg | ORAL_TABLET | Freq: Three times a day (TID) | ORAL | Status: DC
  Administered 2019-04-10 – 2019-04-13 (×8): 100 mg via ORAL
  Filled 2019-04-10 (×10): qty 2

## 2019-04-10 MED ORDER — FUROSEMIDE 10 MG/ML IJ SOLN
5.0000 mg/h | INTRAVENOUS | Status: DC
  Administered 2019-04-10 – 2019-04-15 (×3): 5 mg/h via INTRAVENOUS
  Filled 2019-04-10 (×3): qty 25

## 2019-04-10 MED ORDER — CLONIDINE HCL 0.1 MG PO TABS
0.3000 mg | ORAL_TABLET | Freq: Three times a day (TID) | ORAL | Status: DC
  Administered 2019-04-10 – 2019-04-17 (×16): 0.3 mg via ORAL
  Filled 2019-04-10 (×18): qty 3

## 2019-04-10 MED ORDER — ASPIRIN EC 81 MG PO TBEC
81.0000 mg | DELAYED_RELEASE_TABLET | Freq: Every evening | ORAL | Status: DC
  Administered 2019-04-11 – 2019-04-17 (×6): 81 mg via ORAL
  Filled 2019-04-10 (×6): qty 1

## 2019-04-10 MED ORDER — ACETAMINOPHEN 325 MG PO TABS
650.0000 mg | ORAL_TABLET | Freq: Four times a day (QID) | ORAL | Status: DC | PRN
  Administered 2019-04-11 – 2019-04-15 (×3): 650 mg via ORAL
  Filled 2019-04-10 (×3): qty 2

## 2019-04-10 MED ORDER — ONDANSETRON HCL 4 MG PO TABS: 4.0000 mg | ORAL_TABLET | Freq: Four times a day (QID) | ORAL | Status: DC | PRN

## 2019-04-10 NOTE — H&P (Signed)
History and Physical    Priscilla Villanueva:[id number] DOB: March 01, 1951 DOA: 04/10/2019  PCP: Tracie Harrier, MD  Patient coming from: Home  I have personally briefly reviewed patient's old medical records in [place]  Chief Complaint: Progressive edema  HPI: Priscilla Villanueva is a 68 y.o. female with medical history significant for CKD stage III, chronic diastolic CHF, mod-severe pulmonary hypertension, insulin-dependent type 2 diabetes, hypertension, hyperlipidemia, CAD, and history of colon cancer who is a direct admission per nephrology for further management of progressive volume overload.  Patient reports about 40 pound progressive weight gain over the last month despite home diuretics.  She has been having increased swelling in her legs and abdomen.  She has been having progressive shortness of breath with exertion and at rest as well as orthopnea.  She reports occasional cough productive of slightly yellow sputum.  She denies any chest pain, palpitations, fevers, chills, diaphoresis, abdominal pain, dysuria, or diarrhea.  She was seen by her nephrologist Dr. Candiss Norse on 04/09/2019 who subsequently recommended direct admission to the hospital for management of progressive volume overload with ultrafiltration dialysis.   Review of Systems: All systems reviewed and are negative except as documented in history of present illness above.   Past Medical History:  Diagnosis Date   CAD (coronary artery disease)    CHF (congestive heart failure) ([place])    Colon cancer ([place]) 2009   with chemotherapy   Colon cancer ([place]) 11/26/2014   Depression    Diabetes mellitus without complication ([place])    Hypercholesteremia    Hypertension    Obesity    Personal history of chemotherapy 2009   colon ca    Past Surgical History:  Procedure Laterality Date   ABDOMINAL HYSTERECTOMY     PARTIAL   COLECTOMY     COLONOSCOPY WITH PROPOFOL N/A 07/16/2017   Procedure: COLONOSCOPY WITH  PROPOFOL;  Surgeon: Lollie Sails, MD;  Location: [facility] ENDOSCOPY;  Service: Endoscopy;  Laterality: N/A;   PERIPHERAL VASCULAR CATHETERIZATION N/A 01/26/2015   Procedure: Glori Luis Cath Removal;  Surgeon: Katha Cabal, MD;  Location: [facility] CV LAB;  Service: Cardiovascular;  Laterality: N/A;   RIGHT HEART CATH AND CORONARY ANGIOGRAPHY Right 04/02/2019   Procedure: RIGHT HEART CATH AND CORONARY ANGIOGRAPHY;  Surgeon: Yolonda Kida, MD;  Location: [facility];  Service: Cardiovascular;  Laterality: Right;   UPPER GI ENDOSCOPY      Social History:  reports that she has quit smoking. She has a 3.00 pack-year smoking history. She has never used smokeless tobacco. She reports current alcohol use of about 1.0 standard drinks of alcohol per week. She reports that she does not use drugs.  Allergies  Allergen Reactions   Amlodipine Swelling    Causes pt's feet and legs to swell...   Hydrochlorothiazide Other (See Comments)    Headaches.   Lisinopril Swelling and Cough   Sitagliptin Diarrhea   Zoloft [Sertraline] Other (See Comments)    Unsure of exact reaction    Family History  Problem Relation Age of Onset   Colon cancer Mother        "had some type of tumor in the vulva liver unknown primary"   Colon cancer Brother        "died at young age"   Breast cancer Neg Hx      Prior to Admission medications   Medication Sig Start Date End Date Taking? Authorizing Provider  allopurinol (ZYLOPRIM) 300 MG tablet Take 300 mg  by mouth daily.     [provider]  ALPRAZolam Duanne Moron) 0.25 MG tablet Take 0.25 mg by mouth at bedtime as needed for sleep.     [provider]  aspirin 81 MG EC tablet Take 81 mg by mouth every evening. Swallow whole.     [provider]  atorvastatin (LIPITOR) 40 MG tablet Take 40 mg by mouth every evening.  01/18/19   [provider]  b complex vitamins capsule Take 1 capsule by mouth daily.     [provider]  cloNIDine (CATAPRES) 0.3 MG tablet Take 0.3 mg by mouth 3 (three) times daily.  11/24/14   [provider]  Coenzyme Q10 (COQ10) 100 MG CAPS Take 100 mg by mouth every evening.    [provider]  colchicine 0.6 MG tablet Take 1.2 mg by mouth daily as needed (gout flare ups).  08/28/14   [provider]  furosemide (LASIX) 40 MG tablet Take 0.5 tablets (20 mg total) by mouth 2 (two) times daily for 7 days. 03/13/19 03/20/19  Epifanio Lesches, MD  HUMALOG MIX 75/25 KWIKPEN (75-25) 100 UNIT/ML Kwikpen Inject 28 Units into the skin 2 (two) times daily.  11/04/14   [provider]  hydrALAZINE (APRESOLINE) 100 MG tablet Take 100 mg by mouth 3 (three) times daily. 03/10/19   [provider]  lidocaine (LIDODERM) 5 % Place 1 patch onto the skin every 12 (twelve) hours. Remove & Discard patch within 12 hours or as directed by MD Patient not taking: Reported on 03/11/2019 02/21/19 02/21/20  Lannie Fields, PA-C  liraglutide (VICTOZA) 18 MG/3ML SOPN Inject 0.6 mg into the skin daily.     [provider]  meclizine (ANTIVERT) 12.5 MG tablet Take 1 tablet (12.5 mg total) by mouth 2 (two) times daily. Patient not taking: Reported on 03/31/2019 03/13/19   Epifanio Lesches, MD  metoprolol (TOPROL-XL) 200 MG 24 hr tablet Take 200 mg by mouth daily.    [provider]  olmesartan (BENICAR) 40 MG tablet Take 0.5 tablets (20 mg total) by mouth daily. Patient taking differently: Take 40 mg by mouth daily.  03/13/19 04/12/19  Epifanio Lesches, MD  olopatadine (PATANOL) 0.1 % ophthalmic solution Place 1 drop into both eyes 2 (two) times daily as needed for allergies.    [provider]  polyethylene glycol (MIRALAX / GLYCOLAX) packet Take 17 g by mouth daily as needed (constipation.).     [provider]  potassium chloride (K-DUR) 10 MEQ tablet TAKE ONE TABLET BY MOUTH TWICE DAILY Patient taking differently:  Take 10 mEq by mouth daily.  12/30/14   Evlyn Kanner, NP  torsemide (DEMADEX) 20 MG tablet Take 20 mg by mouth daily.    [provider]    Physical Exam: Vitals:   04/10/19 1757 04/10/19 1758 04/10/19 2002 04/10/19 2017  BP:   (!) 222/104 (!) 183/94  Pulse:   (!) 112 (!) 110  Resp:  18 17   Temp:   98.8 F (37.1 C)   TempSrc:   Oral   SpO2:   94% 95%  Weight: 112.5 kg     Height: 5\' 4"  (1.626 m)       Constitutional: Obese woman resting in bed, NAD, calm, comfortable Eyes: PERRL, lids and conjunctivae normal ENMT: Mucous membranes are moist. Posterior pharynx clear of any exudate or lesions.Normal dentition.  Neck: normal, supple, no masses. Respiratory: clear to auscultation bilaterally, no wheezing, no crackles. Normal respiratory effort. No  accessory muscle use.  Cardiovascular: Regular rate and rhythm, no murmurs / rubs / gallops.  +2 pitting edema up to the knees bilateral lower extremities. 2+ pedal pulses. Abdomen: no tenderness, no masses palpated. No hepatosplenomegaly. Bowel sounds positive.  Musculoskeletal: no clubbing / cyanosis. No joint deformity upper and lower extremities. Good ROM, no contractures. Normal muscle tone.  Skin: no rashes, lesions, ulcers. No induration Neurologic: CN 2-12 grossly intact. Sensation intact, DTR normal. Strength 5/5 in all 4.  Psychiatric: Normal judgment and insight. Alert and oriented x 3. Normal mood.     Labs on Admission: I have personally reviewed following labs and imaging studies  CBC: Recent Labs  Lab 04/10/19 2053  WBC 5.6  HGB 11.6*  HCT 37.9  MCV 82.0  PLT 245*   Basic Metabolic Panel: Recent Labs  Lab 04/10/19 2053  NA 145  K 3.4*  CL 105  CO2 28  GLUCOSE 203*  BUN 22  CREATININE 1.45*  CALCIUM 8.6*   GFR: Estimated Creatinine Clearance: 46.9 mL/min (A) (by C-G formula based on SCr of 1.45 mg/dL (H)). Liver Function Tests: No results for input(s): AST, ALT, ALKPHOS, BILITOT, PROT,  ALBUMIN in the last 168 hours. No results for input(s): LIPASE, AMYLASE in the last 168 hours. No results for input(s): AMMONIA in the last 168 hours. Coagulation Profile: No results for input(s): INR, PROTIME in the last 168 hours. Cardiac Enzymes: No results for input(s): CKTOTAL, CKMB, CKMBINDEX, TROPONINI in the last 168 hours. BNP (last 3 results) No results for input(s): PROBNP in the last 8760 hours. HbA1C: No results for input(s): HGBA1C in the last 72 hours. CBG: No results for input(s): GLUCAP in the last 168 hours. Lipid Profile: No results for input(s): CHOL, HDL, LDLCALC, TRIG, CHOLHDL, LDLDIRECT in the last 72 hours. Thyroid Function Tests: No results for input(s): TSH, T4TOTAL, FREET4, T3FREE, THYROIDAB in the last 72 hours. Anemia Panel: No results for input(s): VITAMINB12, FOLATE, FERRITIN, TIBC, IRON, RETICCTPCT in the last 72 hours. Urine analysis:    Component Value Date/Time   COLORURINE Yellow 05/26/2013 0346   APPEARANCEUR Clear 05/26/2013 0346   LABSPEC 1.011 05/26/2013 0346   PHURINE 8.0 05/26/2013 0346   GLUCOSEU 150 mg/dL 05/26/2013 0346   HGBUR Negative 05/26/2013 0346   BILIRUBINUR Negative 05/26/2013 0346   KETONESUR Negative 05/26/2013 0346   PROTEINUR >=500 05/26/2013 0346   NITRITE Negative 05/26/2013 0346   LEUKOCYTESUR Negative 05/26/2013 0346    Radiological Exams on Admission: No results found.  EKG: Ordered and pending.  Assessment/Plan Principal Problem:   Volume overload Active Problems:   CHF (congestive heart failure) (H[facility])   Diabetes mellitus, type 2 (H[facility])   Hypertension associated with diabetes ([place])   Hyperlipidemia associated with type 2 diabetes mellitus ([place])   CKD (chronic kidney disease), stage III  ALMIRA PHETTEPLACE is a 68 y.o. female with medical history significant for CKD stage III, chronic diastolic CHF, mod-severe pulmonary hypertension, insulin-dependent type 2 diabetes, hypertension, hyperlipidemia, CAD, and  history of colon cancer who is directly admitted per nephrology recommendations for management of hyperkalemia.  Hypervolemia/chronic diastolic CHF/mod-severe pulmonary hypertension: Progressive hypervolemia failing outpatient diuretic therapy.  Nephrology recommending starting IV Lasix infusion for now with plans for dialysis once HD catheter placed. -Plan for dialysis catheter placement by vascular surgery tomorrow -Start IV Lasix 5 mg/h infusion -Monitor strict I/O's, daily weights -Will keep n.p.o. after midnight -Appreciate nephrology and vascular surgery assistance  CKD stage III-IV: Has had worsening renal function over the  last few weeks with most recent creatinine 2.2 03/20/2019.  Repeat creatinine today actually improved to 1.45 with GFR 43. -Continue IV Lasix as above -Plan for HD catheter placement and dialysis as above  Insulin-dependent type 2 diabetes: Continue reduced home Humalog twice daily.  Hypertension: She is hypertensive on admission.  Resume home clonidine, hydralazine, Toprol-XL, and continue IV Lasix as above.  Hold olmesartan.  Hyperlipidemia: Continue atorvastatin.  DVT prophylaxis: SCDs Code Status: Full code, confirmed with patient Family Communication: Discussed with patient, she has discussed with family Disposition Plan: Pending clinical progress Consults called: Nephrology, vascular surgery Admission status: Admit - It is my clinical opinion that admission to INPATIENT is reasonable and necessary because of the expectation that this patient will require hospital care that crosses at least 2 midnights to treat this condition based on the medical complexity of the problems presented.  Given the aforementioned information, the predictability of an adverse outcome is felt to be significant.    Zada Finders MD [facility]  If 7PM-7AM, please contact night-coverage [email or web address]  04/10/2019, 10:11 PM

## 2019-04-11 ENCOUNTER — Inpatient Hospital Stay: Payer: Medicare Other

## 2019-04-11 ENCOUNTER — Inpatient Hospital Stay: Admission: RE | Admit: 2019-04-11 | Payer: Medicare Other | Source: Home / Self Care | Admitting: Vascular Surgery

## 2019-04-11 ENCOUNTER — Encounter
Admission: AD | Disposition: A | Discharge status: Home / Self Care | Payer: Self-pay | Source: Ambulatory Visit | Attending: Internal Medicine

## 2019-04-11 ENCOUNTER — Other Ambulatory Visit (INDEPENDENT_AMBULATORY_CARE_PROVIDER_SITE_OTHER): Payer: Self-pay | Admitting: Vascular Surgery

## 2019-04-11 DIAGNOSIS — E785 Hyperlipidemia, unspecified: Secondary | ICD-10-CM

## 2019-04-11 DIAGNOSIS — N1831 Chronic kidney disease, stage 3a: Secondary | ICD-10-CM

## 2019-04-11 DIAGNOSIS — N185 Chronic kidney disease, stage 5: Secondary | ICD-10-CM

## 2019-04-11 DIAGNOSIS — I5032 Chronic diastolic (congestive) heart failure: Secondary | ICD-10-CM

## 2019-04-11 DIAGNOSIS — N183 Chronic kidney disease, stage 3 unspecified: Secondary | ICD-10-CM

## 2019-04-11 DIAGNOSIS — E1169 Type 2 diabetes mellitus with other specified complication: Secondary | ICD-10-CM

## 2019-04-11 DIAGNOSIS — I10 Essential (primary) hypertension: Secondary | ICD-10-CM

## 2019-04-11 DIAGNOSIS — E8779 Other fluid overload: Secondary | ICD-10-CM

## 2019-04-11 DIAGNOSIS — E1159 Type 2 diabetes mellitus with other circulatory complications: Secondary | ICD-10-CM

## 2019-04-11 HISTORY — PX: TEMPORARY DIALYSIS CATHETER: CATH118312

## 2019-04-11 LAB — CBC
HCT: 34.2 % — ABNORMAL LOW (ref 36.0–46.0)
Hemoglobin: 10 g/dL — ABNORMAL LOW (ref 12.0–15.0)
MCH: 24.9 pg — ABNORMAL LOW (ref 26.0–34.0)
MCHC: 29.2 g/dL — ABNORMAL LOW (ref 30.0–36.0)
MCV: 85.3 fL (ref 80.0–100.0)
Platelets: 153 10*3/uL (ref 150–400)
RBC: 4.01 MIL/uL (ref 3.87–5.11)
RDW: 19.3 % — ABNORMAL HIGH (ref 11.5–15.5)
WBC: 5.1 10*3/uL (ref 4.0–10.5)
nRBC: 0 % (ref 0.0–0.2)

## 2019-04-11 LAB — GLUCOSE, CAPILLARY
Glucose-Capillary: 178 mg/dL — ABNORMAL HIGH (ref 70–99)
Glucose-Capillary: 169 mg/dL — ABNORMAL HIGH (ref 70–99)
Glucose-Capillary: 234 mg/dL — ABNORMAL HIGH (ref 70–99)
Glucose-Capillary: 224 mg/dL — ABNORMAL HIGH (ref 70–99)

## 2019-04-11 LAB — BASIC METABOLIC PANEL
Anion gap: 8 (ref 5–15)
BUN: 21 mg/dL (ref 8–23)
CO2: 28 mmol/L (ref 22–32)
Calcium: 8.3 mg/dL — ABNORMAL LOW (ref 8.9–10.3)
Chloride: 109 mmol/L (ref 98–111)
Creatinine, Ser: 1.38 mg/dL — ABNORMAL HIGH (ref 0.44–1.00)
GFR calc Af Amer: 46 mL/min — ABNORMAL LOW (ref >60)
GFR calc non Af Amer: 40 mL/min — ABNORMAL LOW (ref >60)
Glucose, Bld: 206 mg/dL — ABNORMAL HIGH (ref 70–99)
Potassium: 3.7 mmol/L (ref 3.5–5.1)
Sodium: 145 mmol/L (ref 135–145)

## 2019-04-11 LAB — HEPATITIS B SURFACE ANTIBODY,QUALITATIVE: Hep B S Ab: NONREACTIVE

## 2019-04-11 LAB — HEPATITIS B SURFACE ANTIGEN: Hepatitis B Surface Ag: NONREACTIVE

## 2019-04-11 LAB — SARS CORONAVIRUS 2 (TAT 6-24 HRS): SARS Coronavirus 2: NEGATIVE

## 2019-04-11 LAB — MAGNESIUM: Magnesium: 1.8 mg/dL (ref 1.7–2.4)

## 2019-04-11 LAB — HEPATITIS B CORE ANTIBODY, IGM: Hep B C IgM: NONREACTIVE

## 2019-04-11 SURGERY — TEMPORARY DIALYSIS CATHETER
Anesthesia: Moderate Sedation

## 2019-04-11 MED ORDER — INSULIN ASPART 100 UNIT/ML ~~LOC~~ SOLN
0.0000 [IU] | Freq: Every day | SUBCUTANEOUS | Status: DC
  Administered 2019-04-11: 2 [IU] via SUBCUTANEOUS
  Administered 2019-04-16: 3 [IU] via SUBCUTANEOUS
  Filled 2019-04-11 (×2): qty 1

## 2019-04-11 MED ORDER — INSULIN ASPART 100 UNIT/ML ~~LOC~~ SOLN
0.0000 [IU] | Freq: Three times a day (TID) | SUBCUTANEOUS | Status: DC
  Administered 2019-04-11: 2 [IU] via SUBCUTANEOUS
  Administered 2019-04-11: 3 [IU] via SUBCUTANEOUS
  Administered 2019-04-12: 1 [IU] via SUBCUTANEOUS
  Administered 2019-04-13: 5 [IU] via SUBCUTANEOUS
  Administered 2019-04-13 – 2019-04-14 (×4): 2 [IU] via SUBCUTANEOUS
  Administered 2019-04-15: 1 [IU] via SUBCUTANEOUS
  Administered 2019-04-16: 3 [IU] via SUBCUTANEOUS
  Administered 2019-04-16 (×2): 2 [IU] via SUBCUTANEOUS
  Administered 2019-04-17 (×2): 3 [IU] via SUBCUTANEOUS
  Administered 2019-04-17: 5 [IU] via SUBCUTANEOUS
  Filled 2019-04-11 (×14): qty 1

## 2019-04-11 MED ORDER — CHLORHEXIDINE GLUCONATE CLOTH 2 % EX PADS
6.0000 | MEDICATED_PAD | Freq: Every day | CUTANEOUS | Status: DC
  Administered 2019-04-12 – 2019-04-16 (×4): 6 via TOPICAL

## 2019-04-11 SURGICAL SUPPLY — 1 items: KIT DIALYSIS CATH TRI 30X13 (CATHETERS) ×2 IMPLANT

## 2019-04-11 NOTE — Progress Notes (Signed)
   04/11/19 1445  Time-Out for Hemodialysis  What Procedure? HEMODIALYSIS  Pt Identifiers(min of two) First/Last Name;MRN/Account#  Correct Site? Yes  Correct Side? Yes  Correct Procedure? Yes  Consents Verified? Yes  Rad Studies Available? N/A  Safety Precautions Reviewed? Yes  Engineer, civil (consulting) Number [id number]  Station Number 1  UF/Alarm Test Passed  Conductivity: Meter 13.8  Conductivity: Machine  13.9  pH 7.6  Normal Saline Lot Number [id number]  Dialyzer Lot Number [id number]  Disposable Set Lot Number [id number]  Dialysate Acid Bath Lot Number [id number]  Dialysate HCO3 Bath Lot Number [id number]  Machine Temperature 98.6 F (37 C)  Musician and Audible Yes  Blood Lines Intact and Secured Yes  Pre Treatment Patient Checks  Vascular access used during treatment Catheter  HD catheter dressing before treatment WDL  Patient is receiving dialysis in a chair  (BED)  Isolation Initiated  (NO)  Hepatitis B Surface Antibody  (PENDING)  Date Hepatitis B Surface Antibody Drawn 04/11/19  Hemodialysis Consent Verified Yes  Hemodialysis Standing Orders Initiated Yes  ECG (Telemetry) Monitor On Yes  Prime Ordered Normal Saline  Length of  DialysisTreatment -hour(s) 2 Hour(s)  Dialyzer Elisio 17H NR  Dialysate 3K;2.5 Ca  Dialysate Flow Ordered 0 (SEQ)  Blood Flow Rate Ordered 200 mL/min  Ultrafiltration Goal 1500 Liters  Dialysis Blood Pressure Support Ordered Normal Saline  PT STABLE FOR HD TX CVC WDL SEQUENTIAL TX ONLY

## 2019-04-11 NOTE — Progress Notes (Signed)
   04/11/19 1700  Vital Signs  Temp 98.5 F (36.9 C)  Temp Source Oral  Pulse Rate 76  Pulse Rate Source Monitor  Resp 18  BP (!) 154/71  Oxygen Therapy  SpO2 100 %  O2 Device Nasal Cannula  O2 Flow Rate (L/min) 2 L/min  During Hemodialysis Assessment  Blood Flow Rate (mL/min) 200 mL/min  Arterial Pressure (mmHg) -100 mmHg  Venous Pressure (mmHg) 90 mmHg  Transmembrane Pressure (mmHg) 60 mmHg  Ultrafiltration Rate (mL/min) 1000 mL/min  Dialysate Flow Rate (mL/min) 0 ml/min  Conductivity: Machine  14  HD Safety Checks Performed Yes  KECN 0 KECN  Dialysis Fluid Bolus Normal Saline  Bolus Amount (mL) 250 mL  Intra-Hemodialysis Comments Tolerated well;Tx completed  Post-Hemodialysis Assessment  Rinseback Volume (mL) 250 mL  KECN 0 V  Dialyzer Clearance Clear  Duration of HD Treatment -hour(s) 2 hour(s)  Hemodialysis Intake (mL) 500 mL  UF Total -Machine (mL) 2021 mL  Net UF (mL) 1521 mL  Tolerated HD Treatment Yes  HD TX COMPLETED NO C/OS NO DISTRESS NOTED VITALS STABLE UFG REACHED 1.5L VIA BED

## 2019-04-11 NOTE — Progress Notes (Signed)
   04/11/19 1445  Neurological  Level of Consciousness Alert  Orientation Level Oriented X4  Respiratory  Respiratory Pattern Regular  Bilateral Breath Sounds Clear;Diminished  Cardiac  Pulse Regular  ECG Monitor Yes  PT ARRIVED TO FROM UNIT AFTER CVC PLACEMENT STABLE FOR TX NO C/OS NO DISTRESS NOTED

## 2019-04-11 NOTE — Progress Notes (Signed)
[facility]  ROUNDING NOTE   Subjective:   [name] admitted to [facility] on 04/10/2019 for Volume overload AKI CKD level 3  Patient was seen in our office by my partner, Dr. Candiss Villanueva, on 11/11 where patient was found to be over 40 pounds over her dry weight.  Admitted for IV furosemide gtt and possible dialysis.   Objective:  Vital signs in last 24 hours:  Temp:  [98.4 F (36.9 C)-98.8 F (37.1 C)] 98.4 F (36.9 C) (11/13 0451) Pulse Rate:  [82-112] 82 (11/13 0451) Resp:  [17-18] 17 (11/13 0451) BP: (159-222)/(80-104) 159/80 (11/13 0451) SpO2:  [87 %-100 %] 100 % (11/13 0451) Weight:  [112.5 kg] 112.5 kg (11/12 1757)  Weight change:  Filed Weights   04/10/19 1757  Weight: 112.5 kg    Intake/Output: I/O last 3 completed shifts: In: 36.1 [I.V.:36.1] Out: 400 [Urine:400]   Intake/Output this shift:  Total I/O In: -  Out: 800 [Urine:800]  Physical Exam: General: NAD, laying in bed  Head: Normocephalic, atraumatic. Moist oral mucosal membranes  Eyes: Anicteric, PERRL  Neck: Supple, trachea midline  Lungs:  Bilateral crackles, diminished at bases  Heart: Regular rate and rhythm  Abdomen:  abdominal wall edema   Extremities: ++ peripheral edema.  Neurologic: Nonfocal, moving all four extremities  Skin: No lesions  Access: none    Basic Metabolic Panel: Recent Labs  Lab 04/10/19 2053 04/11/19 0354  NA 145 145  K 3.4* 3.7  CL 105 109  CO2 28 28  GLUCOSE 203* 206*  BUN 22 21  CREATININE 1.45* 1.38*  CALCIUM 8.6* 8.3*  MG  --  1.8    Liver Function Tests: No results for input(s): AST, ALT, ALKPHOS, BILITOT, PROT, ALBUMIN in the last 168 hours. No results for input(s): LIPASE, AMYLASE in the last 168 hours. No results for input(s): AMMONIA in the last 168 hours.  CBC: Recent Labs  Lab 04/10/19 2053 04/11/19 0354  WBC 5.6 5.1  HGB 11.6* 10.0*  HCT 37.9 34.2*  MCV 82.0 85.3  PLT 148* 153    Cardiac Enzymes: No results for  input(s): CKTOTAL, CKMB, CKMBINDEX, TROPONINI in the last 168 hours.  BNP: Invalid input(s): POCBNP  CBG: Recent Labs  Lab 04/10/19 2253 04/11/19 0751 04/11/19 1141  GLUCAP 222* 178* 169*    Microbiology: Results for orders placed or performed during the hospital encounter of 04/01/19  SARS CORONAVIRUS 2 (TAT 6-24 HRS) Nasopharyngeal Nasopharyngeal Swab     Status: None   Collection Time: 04/01/19 10:22 AM   Specimen: Nasopharyngeal Swab  Result Value Ref Range Status   SARS Coronavirus 2 NEGATIVE NEGATIVE Final    Comment: (NOTE) SARS-CoV-2 target nucleic acids are NOT DETECTED. The SARS-CoV-2 RNA is generally detectable in upper and lower respiratory specimens during the acute phase of infection. Negative results do not preclude SARS-CoV-2 infection, do not rule out co-infections with other pathogens, and should not be used as the sole basis for treatment or other patient management decisions. Negative results must be combined with clinical observations, patient history, and epidemiological information. The expected result is Negative. Fact Sheet for Patients: SugarRoll.be Fact Sheet for Healthcare Providers: https://www.woods-mathews.com/ This test is not yet approved or cleared by the [place] FDA and  has been authorized for detection and/or diagnosis of SARS-CoV-2 by FDA under an Emergency Use Authorization (EUA). This EUA will remain  in effect (meaning this test can be used) for the duration of the COVID-19 declaration under Section 56  4(b)(1) of the Act, 21 U.S.C. section 360bbb-3(b)(1), unless the authorization is terminated or revoked sooner. Performed at [facility], [place] [address], [place], [place] [address]     Coagulation Studies: No results for input(s): LABPROT, INR in the last 72 hours.  Urinalysis: No results for input(s): COLORURINE, LABSPEC, PHURINE, GLUCOSEU, HGBUR, BILIRUBINUR, KETONESUR,  PROTEINUR, UROBILINOGEN, NITRITE, LEUKOCYTESUR in the last 72 hours.  Invalid input(s): APPERANCEUR    Imaging: No results found.   Medications:   . furosemide (LASIX) infusion 5 mg/hr (04/11/19 0600)   . aspirin EC  81 mg Oral QPM  . atorvastatin  40 mg Oral QPM  . cloNIDine  0.3 mg Oral TID  . hydrALAZINE  100 mg Oral TID  . insulin aspart protamine- aspart  14 Units Subcutaneous BID WC  . metoprolol  200 mg Oral Daily  . sodium chloride flush  3 mL Intravenous Q12H   acetaminophen **OR** acetaminophen, ondansetron **OR** ondansetron (ZOFRAN) IV  Assessment/ Plan:  [name] is a 68 y.o. black female with hypertension, diabetes mellitus type II, hyperlipidemia, depression, history of colon cancer, coronary artery disease, congestive heart failure with diastolic disfunction, right rotator cuff trauma, hyperaldosteronism who is admitted from nephrology clinic on 04/10/2019 for Volume overload AKI CKD level 3  1. 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  Chronic kidney disease secondary to diabetic nephropathy Acute renal failure in 02/2019 was due to acute cardiorenal syndrome. Suspect the same on this admission.   2. Acute exacerbation of diastolic congestive heart failure. Failed outpatient diuretic therapy.   3. Hypertension: with chronic kidney disease: home regimen of olmesartan, clonidinehydralazine, metoprolol and torsemide. Patient unable to tolerate spironolactone in the past.   4. Diabetes mellitus type II with chronic kidney disease: insulin dependent. Hemoglobin A1c of 7.2% on 03/11/2019 - holding metformin  Plan - Initiated on furosemide gtt - Due to extraordinary amount of volume, will schedule for hemodialysis for volume removal. Ultrafiltration only.  - Vascular consulted for dialysis catheter placement   LOS: 1 Ashonti Leandro 11/13/202011:45 AM

## 2019-04-11 NOTE — Op Note (Signed)
  OPERATIVE NOTE   PROCEDURE: 1. Ultrasound guidance for vascular access right femoral vein 2. Placement of a 30 cm triple lumen dialysis catheter right femoral vein  PRE-OPERATIVE DIAGNOSIS: 1.  Chronic renal failure and volume overload  POST-OPERATIVE DIAGNOSIS: Same  SURGEON: Leotis Pain, MD  ASSISTANT(S): None  ANESTHESIA: local  ESTIMATED BLOOD LOSS: Minimal   FINDING(S): 1. None  SPECIMEN(S): None  INDICATIONS:  Patient is a 68 y.o.female who presents with with volume overload and chronic renal failure needing some ultrafiltration to remove fluid and some electrolyte imbalances.  Risks and benefits were discussed, and informed consent was obtained..  DESCRIPTION: After obtaining full informed written consent, the patient was laid flat in the bed. The right groin was sterilely prepped and draped in a sterile surgical field was created. The right femoral vein was visualized with ultrasound and found to be widely patent. It was then accessed under direct guidance without difficulty with a Seldinger needle and a permanent image was recorded. A J-wire was then placed. After skin nick and dilatation, a 30 cm triple-lumen dialysis catheter was placed over the wire and the wire was removed. The lumens withdrew dark red nonpulsatile blood and flushed easily with sterile saline. The catheter was secured to the skin with 3 nylon sutures. Sterile dressing was placed.  COMPLICATIONS: None  CONDITION: Stable  Leotis Pain 04/11/2019 2:37 PM  This note was created with Dragon Medical transcription system. Any errors in dictation are purely unintentional.

## 2019-04-11 NOTE — Progress Notes (Signed)
Patients urine is a pink color this evening whereas it was yellow earlier. Told nurse on next shift about this change

## 2019-04-11 NOTE — Progress Notes (Signed)
[facility] Consult Note  MRN : [id number]  Priscilla Villanueva is a 68 y.o. ([birth year]) female who presents with chief complaint of "fluid overload".  History of Present Illness:  Patient is admitted to the hospital by the recommendation of her nephrologist for management of progressive fluid overload.  Patient has a medical history significant for CKD stage III, chronic diastolic CHF, mod-severe pulmonary hypertension, insulin-dependent type 2 diabetes, hypertension, hyperlipidemia, CAD, and history of colon cancer. The patient endorses a history of progressive weight gain - approximately 40 pounds over the last month. Notes increasing SOB with exertion / ambulation. Has experienced increased swelling to her bilateral lower extremity. Denies chest pain. Denies nausea, vomiting or fever. Patient has been treated with diuretics as an outpatient without success.   The nephrology service has decided to initiate dialysis at this time, and we are asked by Dr. Juleen China to place a temporary dialysis catheter for immediate dialysis use.    Current Facility-Administered Medications  Medication Dose Route Frequency Provider Last Rate Last Dose  . acetaminophen (TYLENOL) tablet 650 mg  650 mg Oral Q6H PRN Lenore Cordia, MD       Or  . acetaminophen (TYLENOL) suppository 650 mg  650 mg Rectal Q6H PRN Lenore Cordia, MD      . aspirin EC tablet 81 mg  81 mg Oral QPM Patel, Vishal R, MD      . atorvastatin (LIPITOR) tablet 40 mg  40 mg Oral QPM Lenore Cordia, MD   40 mg at 04/10/19 2150  . cloNIDine (CATAPRES) tablet 0.3 mg  0.3 mg Oral TID Lenore Cordia, MD   Stopped at 04/11/19 0815  . furosemide (LASIX) 250 mg in dextrose 5 % 250 mL (1 mg/mL) infusion  5 mg/hr Intravenous Continuous Zada Finders R, MD 5 mL/hr at 04/11/19 0600 5 mg/hr at 04/11/19 0600  . hydrALAZINE (APRESOLINE) tablet 100 mg  100 mg Oral TID Lenore Cordia, MD   Stopped at 04/11/19 [phone]  . insulin  aspart protamine- aspart (NOVOLOG MIX 70/30) injection 14 Units  14 Units Subcutaneous BID WC Patel, Vishal R, MD      . metoprolol succinate (TOPROL-XL) 24 hr tablet 200 mg  200 mg Oral Daily Lenore Cordia, MD   Stopped at 04/11/19 [phone]  . ondansetron (ZOFRAN) tablet 4 mg  4 mg Oral Q6H PRN Lenore Cordia, MD       Or  . ondansetron (ZOFRAN) injection 4 mg  4 mg Intravenous Q6H PRN Zada Finders R, MD      . sodium chloride flush (NS) 0.9 % injection 3 mL  3 mL Intravenous Q12H Lenore Cordia, MD   3 mL at 04/11/19 5427   Past Medical History:  Diagnosis Date  . CAD (coronary artery disease)   . CHF (congestive heart failure) ([place])   . Colon cancer ([place]) 2009   with chemotherapy  . Colon cancer ([place]) 11/26/2014  . Depression   . Diabetes mellitus without complication ([place])   . Hypercholesteremia   . Hypertension   . Obesity   . Personal history of chemotherapy 2009   colon ca   Past Surgical History:  Procedure Laterality Date  . ABDOMINAL HYSTERECTOMY     PARTIAL  . COLECTOMY    . COLONOSCOPY WITH PROPOFOL N/A 07/16/2017   Procedure: COLONOSCOPY WITH PROPOFOL;  Surgeon: Lollie Sails, MD;  Location: [facility] ENDOSCOPY;  Service: Endoscopy;  Laterality: N/A;  . PERIPHERAL  VASCULAR CATHETERIZATION N/A 01/26/2015   Procedure: Glori Luis Cath Removal;  Surgeon: Katha Cabal, MD;  Location: [facility];  Service: Cardiovascular;  Laterality: N/A;  . RIGHT HEART CATH AND CORONARY ANGIOGRAPHY Right 04/02/2019   Procedure: RIGHT HEART CATH AND CORONARY ANGIOGRAPHY;  Surgeon: Yolonda Kida, MD;  Location: [facility];  Service: Cardiovascular;  Laterality: Right;  . UPPER GI ENDOSCOPY     Social History Social History   Tobacco Use  . Smoking status: Former Smoker    Packs/day: 1.00    Years: 3.00    Pack years: 3.00  . Smokeless tobacco: Never Used  Substance Use Topics  . Alcohol use: Yes    Alcohol/week: 1.0 standard drinks    Types: 1 Cans of beer  per week    Comment: socially beer  . Drug use: No   Family History Family History  Problem Relation Age of Onset  . Colon cancer Mother        "had some type of tumor in the vulva liver unknown primary"  . Colon cancer Brother        "died at young age"  . Breast cancer Neg Hx   Denies family history of PAD, renal disease or bleeding / clotting disorders.  Allergies  Allergen Reactions  . Amlodipine Swelling    Causes pt's feet and legs to swell...  . Hydrochlorothiazide Other (See Comments)    Headaches.  . Lisinopril Swelling and Cough  . Sitagliptin Diarrhea  . Zoloft [Sertraline] Other (See Comments)    Unsure of exact reaction   REVIEW OF SYSTEMS (Negative unless checked)  Constitutional: [] Weight loss  [] Fever  [] Chills Cardiac: [] Chest pain   [] Chest pressure   [] Palpitations   [x] Shortness of breath when laying flat   [x] Shortness of breath at rest   [x] Shortness of breath with exertion. Vascular:  [] Pain in legs with walking   [] Pain in legs at rest   [] Pain in legs when laying flat   [] Claudication   [] Pain in feet when walking  [] Pain in feet at rest  [] Pain in feet when laying flat   [] History of DVT   [] Phlebitis   [x] Swelling in legs   [] Varicose veins   [] Non-healing ulcers Pulmonary:   [] Uses home oxygen   [] Productive cough   [] Hemoptysis   [] Wheeze  [] COPD   [] Asthma Neurologic:  [] Dizziness  [] Blackouts   [] Seizures   [] History of stroke   [] History of TIA  [] Aphasia   [] Temporary blindness   [] Dysphagia   [] Weakness or numbness in arms   [] Weakness or numbness in legs Musculoskeletal:  [] Arthritis   [] Joint swelling   [] Joint pain   [] Low back pain Hematologic:  [] Easy bruising  [] Easy bleeding   [] Hypercoagulable state   [] Anemic  [] Hepatitis Gastrointestinal:  [] Blood in stool   [] Vomiting blood  [] Gastroesophageal reflux/heartburn   [] Difficulty swallowing. Genitourinary:  [x] Chronic kidney disease   [] Difficult urination  [] Frequent urination  [] Burning  with urination   [] Blood in urine Skin:  [] Rashes   [] Ulcers   [] Wounds Psychological:  [] History of anxiety   []  History of major depression.  Physical Examination  Vitals:   04/10/19 1758 04/10/19 2002 04/10/19 2017 04/11/19 0451  BP:  (!) 222/104 (!) 183/94 (!) 159/80  Pulse:  (!) 112 (!) 110 82  Resp: 18 17  17   Temp:  98.8 F (37.1 C)  98.4 F (36.9 C)  TempSrc:  Oral  Oral  SpO2:  94% 95%  100%  Weight:      Height:       Body mass index is 42.57 kg/m. Gen: WD/WN Head: [place]/AT, No temporalis wasting Ear/Nose/Throat: Hearing grossly intact, nares w/o erythema or drainage Eyes: Sclera non-icteric, conjunctiva clear Neck: Supple, no nuchal rigidity.  No JVD.  Pulmonary:  Good air movement, clear to auscultation bilaterally.  Cardiac: RRR, normal S1, S2, no Murmurs, rubs or gallops. Vascular: Bilateral extremities are warm distally to toes. Good capillary refill to toes. Gastrointestinal: soft, non-tender/non-distended. No guarding/reflex.  Musculoskeletal: M/S 5/5 throughout.  Extremities without ischemic changes.  No deformity or atrophy. Mild-moderate edema in the lower extremities bilaterally Neurologic: Intact. No sensory / motor deficits noted Psychiatric: Appropriate for patients clinical diagnosis  Dermatologic: No rashes or ulcers noted.   Lymph : No Cervical, Axillary, or Inguinal lymphadenopathy.  CBC Lab Results  Component Value Date   WBC 5.1 04/11/2019   HGB 10.0 (L) 04/11/2019   HCT 34.2 (L) 04/11/2019   MCV 85.3 04/11/2019   PLT 153 04/11/2019   BMET    Component Value Date/Time   NA 145 04/11/2019 0354   NA 134 (L) 05/28/2013 0825   K 3.7 04/11/2019 0354   K 3.5 05/28/2013 0825   CL 109 04/11/2019 0354   CL 99 05/28/2013 0825   CO2 28 04/11/2019 0354   CO2 25 05/28/2013 0825   GLUCOSE 206 (H) 04/11/2019 0354   GLUCOSE 222 (H) 05/28/2013 0825   BUN 21 04/11/2019 0354   BUN 30 (H) 05/28/2013 0825   CREATININE 1.38 (H) 04/11/2019 0354    CREATININE 1.40 (H) 05/28/2013 0825   CALCIUM 8.3 (L) 04/11/2019 0354   CALCIUM 9.2 05/28/2013 0825   GFRNONAA 40 (L) 04/11/2019 0354   GFRNONAA 41 (L) 05/28/2013 0825   GFRAA 46 (L) 04/11/2019 0354   GFRAA 47 (L) 05/28/2013 0825   Estimated Creatinine Clearance: 49.3 mL/min (A) (by C-G formula based on SCr of 1.38 mg/dL (H)).  COAG Lab Results  Component Value Date   INR 1.0 02/06/2013   INR 0.8 08/25/2011   Radiology Mr Shoulder Right Wo Contrast  Result Date: 03/17/2019 CLINICAL DATA:  [place] 1 month ago. Persistent shoulder pain and painful range of motion. EXAM: MRI OF THE RIGHT SHOULDER WITHOUT CONTRAST TECHNIQUE: Multiplanar, multisequence MR imaging of the shoulder was performed. No intravenous contrast was administered. COMPARISON:  Shoulder radiographs 02/21/2019 FINDINGS: Rotator cuff: There is a full-thickness retracted tear involving the anterior aspect of the supraspinatus tendon. Maximum retraction is estimated at 15 mm and the tear is approximately 15 mm wide. The subscapularis tendon is intact. Mild to moderate tendinopathy with small interstitial tears. The subscapularis tendon is intact. Muscles: Mild diffuse fatty atrophy of the shoulder musculature. There is also a benign-appearing lipoma in the chest wall near the teres major muscle. Biceps long head:  Intact Acromioclavicular Joint: Mild degenerative changes. Type 2 acromion. No significant lateral downsloping or undersurface spurring. Glenohumeral Joint: Moderate degenerative changes with joint space narrowing, degenerative chondrosis, early spurring and small joint effusion. Labrum:  No definite labral tears. Bones:  No acute bony findings. Other: Expected fluid in the subacromial/subdeltoid bursa. IMPRESSION: 1. Full-thickness retracted supraspinatus tendon tear measuring approximately 15 x 15 mm. 2. Infraspinatus and subscapularis tendinopathy but no tears. 3. Intact long head biceps tendon and glenoid labrum. 4. No  significant MR findings to suggest bony impingement. 5. Moderate glenohumeral joint degenerative changes. Electronically Signed   By: Marijo Sanes M.D.   On: 03/17/2019 13:42  US Carotid Bilateral  Result Date: 03/12/2019 CLINICAL DATA:  Dizziness, hypertension, hyperlipidemia EXAM: BILATERAL CAROTID DUPLEX ULTRASOUND TECHNIQUE: Pearline Cables scale imaging, color Doppler and duplex ultrasound were performed of bilateral carotid and vertebral arteries in the neck. COMPARISON:  None. FINDINGS: Criteria: Quantification of carotid stenosis is based on velocity parameters that correlate the residual internal carotid diameter with NASCET-based stenosis levels, using the diameter of the distal internal carotid lumen as the denominator for stenosis measurement. The following velocity measurements were obtained: RIGHT ICA: 54/24 cm/sec CCA: 88/32 cm/sec SYSTOLIC ICA/CCA RATIO:  0.9 ECA: 41 cm/sec LEFT ICA: 62/24 cm/sec CCA: 54/9 cm/sec SYSTOLIC ICA/CCA RATIO:  1.2 ECA: 58 cm/sec RIGHT CAROTID ARTERY: Minor echogenic shadowing plaque formation. No hemodynamically significant right ICA stenosis, velocity elevation, or turbulent flow. Degree of narrowing less than 50%. RIGHT VERTEBRAL ARTERY:  Antegrade LEFT CAROTID ARTERY: Similar scattered minor echogenic plaque formation. No hemodynamically significant left ICA stenosis, velocity elevation, or turbulent flow. LEFT VERTEBRAL ARTERY:  Antegrade IMPRESSION: Minor carotid atherosclerosis. No hemodynamically significant ICA stenosis. Degree of narrowing less than 50% bilaterally by ultrasound criteria. Patent antegrade vertebral flow bilaterally Electronically Signed   By: Jerilynn Mages.  Shick M.D.   On: 03/12/2019 16:19   Assessment/Plan Patient is admitted to the hospital by the recommendation of her nephrologist for management of progressive fluid overload  1. Acute Renal Failure: Patient presents with a known history of chronic kidney disease.  The patient has experienced a weight  gain of approximately 40 pounds over the last month.  Outpatient diuretics have not been successful.  Patient notes increasing shortness of breath as well as edema to the lower extremity.  Patient was admitted by her nephrologist for management of volume overload.  Nephrology has decided to initiate dialysis and we have been asked to place a temporary dialysis catheter.  Procedure, risks and benefits explained to the patient.  All questions answered.  The patient wishes to proceed. If the patient's renal function does not improve throughout the hospital course, we will be happy to place a tunneled dialysis catheter for long term use prior to discharge.   2. Diabetes: On appropriate medications. Encouraged good control as its slows the progression of atherosclerotic and renal disease. 3. Hyperlipidemia: On ASA and statin for medical management. Encouraged good control as its slows the progression of atherosclerotic and renal disease. 4. Hypertension: On appropriate medications. Encouraged good control as its slows the progression of atherosclerotic and renal disease.  Discussed with Dr. Mayme Genta, PA-C  04/11/2019 11:56 AM

## 2019-04-11 NOTE — Progress Notes (Signed)
PROGRESS NOTE    Priscilla Villanueva  WVP:[id number] DOB: [birth year] DOA: 04/10/2019 PCP: Tracie Harrier, MD   Brief Narrative:  68 year old African-American female with a past medical history notable for CKD stage III, chronic diastolic congestive heart failure, moderate severe pulmonary hypertension, insulin-dependent diabetes, hypertension, hyperlipidemia and CAD as well as history of colon cancer who was admitted directly by nephrology for management of progressive volume overload with possibility of dialysis.   Assessment & Plan:   Principal Problem:   Volume overload Active Problems:   CHF (congestive heart failure) (HCC)   Diabetes mellitus, type 2 (HCC)   Hypertension associated with diabetes ([place])   Hyperlipidemia associated with type 2 diabetes mellitus ([place])   CKD (chronic kidney disease), stage III   Hypervolemia with chronic diastolic heart failure moderate severe pulmonary hypertension: - Continue Lasix drip at 5 mics per hour -Continue to monitor strict I's and O's and daily weights -Dialysis catheter placement by vascular surgery today -Nephrology and vascular surgery involved  CKD stage III for: -Lasix drip as above, -Dialysis catheter as above, -Monitor electrolytes, cautiously replete -Possible dialysis for volume removal -Net 1.1 L output  Insulin-dependent diabetes type 2: -Continue reduced dose of Humalog -Sliding scale insulin light  Hypertension: -Continue patient's clonidine, hydralazine, Toprol -Holding ARB -Continue Lasix drip  Hyperlipidemia: -Continue statin   DVT prophylaxis: SCD/Compression stockings  Code Status: Full code    Code Status Orders  (From admission, onward)         Start     Ordered   04/10/19 1959  Full code  Continuous     04/10/19 1959        Code Status History    Date Active Date Inactive Code Status Order ID Comments User Context   04/02/2019 0942 04/02/2019 1359 Full Code [id number]  Yolonda Kida, MD  Inpatient   03/11/2019 0540 03/13/2019 1646 Full Code [id number]  Sidney Ace Arvella Merles, MD ED   01/26/2015 1435 01/26/2015 1800 Full Code [id number]  Schnier, Dolores Lory, MD Inpatient   Advance Care Planning Activity    Advance Directive Documentation     Most Recent Value  Type of Advance Directive  Living will, Healthcare Power of Attorney  Pre-existing out of facility DNR order (yellow form or pink MOST form)  -  "MOST" Form in Place?  -     Family Communication: Discussed with patient in detail she will self update family Disposition Plan:   Patient remained inpatient for continued IV diuresis continuous infusion, dialysis catheter placement, further subspecialty evaluation for possible initiation of dialysis.  Patient is not stable for discharge.  Without these services would be at risk of severe clinical deterioration Consults called: None Admission status: Inpatient   Consultants:   Nephrology, vascular surgery  Procedures:  Dg Chest 2 View  Result Date: 04/11/2019 CLINICAL DATA:  Pulmonary edema EXAM: CHEST - 2 VIEW COMPARISON:  03/11/2019 FINDINGS: Cardiomegaly. Mild, diffuse bilateral interstitial pulmonary opacity and trace pleural effusions. The visualized skeletal structures are unremarkable. IMPRESSION: 1. Mild, diffuse bilateral interstitial pulmonary opacity and trace pleural effusions, findings consistent with edema. No focal airspace opacity. 2.  Cardiomegaly. Electronically Signed   By: Eddie Candle M.D.   On: 04/11/2019 12:51   Mr Shoulder Right Wo Contrast  Result Date: 03/17/2019 CLINICAL DATA:  [place] 1 month ago. Persistent shoulder pain and painful range of motion. EXAM: MRI OF THE RIGHT SHOULDER WITHOUT CONTRAST TECHNIQUE: Multiplanar, multisequence MR imaging of the shoulder was performed. No intravenous contrast was  administered. COMPARISON:  Shoulder radiographs 02/21/2019 FINDINGS: Rotator cuff: There is a full-thickness retracted tear involving the anterior aspect of  the supraspinatus tendon. Maximum retraction is estimated at 15 mm and the tear is approximately 15 mm wide. The subscapularis tendon is intact. Mild to moderate tendinopathy with small interstitial tears. The subscapularis tendon is intact. Muscles: Mild diffuse fatty atrophy of the shoulder musculature. There is also a benign-appearing lipoma in the chest wall near the teres major muscle. Biceps long head:  Intact Acromioclavicular Joint: Mild degenerative changes. Type 2 acromion. No significant lateral downsloping or undersurface spurring. Glenohumeral Joint: Moderate degenerative changes with joint space narrowing, degenerative chondrosis, early spurring and small joint effusion. Labrum:  No definite labral tears. Bones:  No acute bony findings. Other: Expected fluid in the subacromial/subdeltoid bursa. IMPRESSION: 1. Full-thickness retracted supraspinatus tendon tear measuring approximately 15 x 15 mm. 2. Infraspinatus and subscapularis tendinopathy but no tears. 3. Intact long head biceps tendon and glenoid labrum. 4. No significant MR findings to suggest bony impingement. 5. Moderate glenohumeral joint degenerative changes. Electronically Signed   By: Marijo Sanes M.D.   On: 03/17/2019 13:42   US Carotid Bilateral  Result Date: 03/12/2019 CLINICAL DATA:  Dizziness, hypertension, hyperlipidemia EXAM: BILATERAL CAROTID DUPLEX ULTRASOUND TECHNIQUE: Pearline Cables scale imaging, color Doppler and duplex ultrasound were performed of bilateral carotid and vertebral arteries in the neck. COMPARISON:  None. FINDINGS: Criteria: Quantification of carotid stenosis is based on velocity parameters that correlate the residual internal carotid diameter with NASCET-based stenosis levels, using the diameter of the distal internal carotid lumen as the denominator for stenosis measurement. The following velocity measurements were obtained: RIGHT ICA: 54/24 cm/sec CCA: 85/63 cm/sec SYSTOLIC ICA/CCA RATIO:  0.9 ECA: 41 cm/sec LEFT  ICA: 62/24 cm/sec CCA: 14/9 cm/sec SYSTOLIC ICA/CCA RATIO:  1.2 ECA: 58 cm/sec RIGHT CAROTID ARTERY: Minor echogenic shadowing plaque formation. No hemodynamically significant right ICA stenosis, velocity elevation, or turbulent flow. Degree of narrowing less than 50%. RIGHT VERTEBRAL ARTERY:  Antegrade LEFT CAROTID ARTERY: Similar scattered minor echogenic plaque formation. No hemodynamically significant left ICA stenosis, velocity elevation, or turbulent flow. LEFT VERTEBRAL ARTERY:  Antegrade IMPRESSION: Minor carotid atherosclerosis. No hemodynamically significant ICA stenosis. Degree of narrowing less than 50% bilaterally by ultrasound criteria. Patent antegrade vertebral flow bilaterally Electronically Signed   By: Jerilynn Mages.  Shick M.D.   On: 03/12/2019 16:19     Antimicrobials:   None   Subjective: Admitted overnight with 40 pound weight gain over the last month No acute decompensation overnight  Objective: Vitals:   04/10/19 2002 04/10/19 2017 04/11/19 0451 04/11/19 1330  BP: (!) 222/104 (!) 183/94 (!) 159/80 (!) 191/91  Pulse: (!) 112 (!) 110 82 90  Resp: 17  17 20   Temp: 98.8 F (37.1 C)  98.4 F (36.9 C) 98.9 F (37.2 C)  TempSrc: Oral  Oral Oral  SpO2: 94% 95% 100% 98%  Weight:      Height:        Intake/Output Summary (Last 24 hours) at 04/11/2019 1342 Last data filed at 04/11/2019 0914 Gross per 24 hour  Intake 36.14 ml  Output 1200 ml  Net -1163.86 ml   Filed Weights   04/10/19 1757  Weight: 112.5 kg    Examination:  General exam: Appears calm and comfortable  Respiratory system: Clear to auscultation. Respiratory effort normal. Cardiovascular system: S1 & S2 heard, RRR. No JVD, murmurs, rubs, gallops or clicks. No pedal edema. Gastrointestinal system: Abdomen is nondistended, soft and nontender. No organomegaly  or masses felt. Normal bowel sounds heard. Central nervous system: Alert and oriented. No focal neurological deficits. Extremities: 2-3+ pitting edema  bilateral lower extremities. Skin: No rashes, lesions or ulcers Psychiatry: Judgement and insight appear normal. Mood & affect appropriate.     Data Reviewed: I have personally reviewed following labs and imaging studies  CBC: Recent Labs  Lab 04/10/19 2053 04/11/19 0354  WBC 5.6 5.1  HGB 11.6* 10.0*  HCT 37.9 34.2*  MCV 82.0 85.3  PLT 148* 532   Basic Metabolic Panel: Recent Labs  Lab 04/10/19 2053 04/11/19 0354  NA 145 145  K 3.4* 3.7  CL 105 109  CO2 28 28  GLUCOSE 203* 206*  BUN 22 21  CREATININE 1.45* 1.38*  CALCIUM 8.6* 8.3*  MG  --  1.8   GFR: Estimated Creatinine Clearance: 49.3 mL/min (A) (by C-G formula based on SCr of 1.38 mg/dL (H)). Liver Function Tests: No results for input(s): AST, ALT, ALKPHOS, BILITOT, PROT, ALBUMIN in the last 168 hours. No results for input(s): LIPASE, AMYLASE in the last 168 hours. No results for input(s): AMMONIA in the last 168 hours. Coagulation Profile: No results for input(s): INR, PROTIME in the last 168 hours. Cardiac Enzymes: No results for input(s): CKTOTAL, CKMB, CKMBINDEX, TROPONINI in the last 168 hours. BNP (last 3 results) No results for input(s): PROBNP in the last 8760 hours. HbA1C: No results for input(s): HGBA1C in the last 72 hours. CBG: Recent Labs  Lab 04/10/19 2253 04/11/19 0751 04/11/19 1141  GLUCAP 222* 178* 169*   Lipid Profile: No results for input(s): CHOL, HDL, LDLCALC, TRIG, CHOLHDL, LDLDIRECT in the last 72 hours. Thyroid Function Tests: No results for input(s): TSH, T4TOTAL, FREET4, T3FREE, THYROIDAB in the last 72 hours. Anemia Panel: No results for input(s): VITAMINB12, FOLATE, FERRITIN, TIBC, IRON, RETICCTPCT in the last 72 hours. Sepsis Labs: No results for input(s): PROCALCITON, LATICACIDVEN in the last 168 hours.  Recent Results (from the past 240 hour(s))  SARS CORONAVIRUS 2 (TAT 6-24 HRS) Nasopharyngeal Nasopharyngeal Swab     Status: None   Collection Time: 04/10/19  9:14 PM    Specimen: Nasopharyngeal Swab  Result Value Ref Range Status   SARS Coronavirus 2 NEGATIVE NEGATIVE Final    Comment: (NOTE) SARS-CoV-2 target nucleic acids are NOT DETECTED. The SARS-CoV-2 RNA is generally detectable in upper and lower respiratory specimens during the acute phase of infection. Negative results do not preclude SARS-CoV-2 infection, do not rule out co-infections with other pathogens, and should not be used as the sole basis for treatment or other patient management decisions. Negative results must be combined with clinical observations, patient history, and epidemiological information. The expected result is Negative. Fact Sheet for Patients: SugarRoll.be Fact Sheet for Healthcare Providers: https://www.woods-mathews.com/ This test is not yet approved or cleared by the [place] FDA and  has been authorized for detection and/or diagnosis of SARS-CoV-2 by FDA under an Emergency Use Authorization (EUA). This EUA will remain  in effect (meaning this test can be used) for the duration of the COVID-19 declaration under Section 56 4(b)(1) of the Act, 21 U.S.C. section 360bbb-3(b)(1), unless the authorization is terminated or revoked sooner. Performed at [facility], [place] [address] [place], [place] [address]          Radiology Studies: Dg Chest 2 View  Result Date: 04/11/2019 CLINICAL DATA:  Pulmonary edema EXAM: CHEST - 2 VIEW COMPARISON:  03/11/2019 FINDINGS: Cardiomegaly. Mild, diffuse bilateral interstitial pulmonary opacity and trace pleural effusions.  The visualized skeletal structures are unremarkable. IMPRESSION: 1. Mild, diffuse bilateral interstitial pulmonary opacity and trace pleural effusions, findings consistent with edema. No focal airspace opacity. 2.  Cardiomegaly. Electronically Signed   By: Eddie Candle M.D.   On: 04/11/2019 12:51        Scheduled Meds: . aspirin EC  81 mg Oral QPM  .  atorvastatin  40 mg Oral QPM  . cloNIDine  0.3 mg Oral TID  . hydrALAZINE  100 mg Oral TID  . insulin aspart protamine- aspart  14 Units Subcutaneous BID WC  . metoprolol  200 mg Oral Daily  . sodium chloride flush  3 mL Intravenous Q12H   Continuous Infusions: . furosemide (LASIX) infusion 5 mg/hr (04/11/19 0600)     LOS: 1 day    Time spent: 4 min    Nicolette Bang, MD [facility]  If 7PM-7AM, please contact night-coverage  04/11/2019, 1:42 PM

## 2019-04-12 LAB — CBC WITH DIFFERENTIAL/PLATELET
Abs Immature Granulocytes: 0.02 10*3/uL (ref 0.00–0.07)
Basophils Absolute: 0 10*3/uL (ref 0.0–0.1)
Basophils Relative: 1 %
Eosinophils Absolute: 0.1 10*3/uL (ref 0.0–0.5)
Eosinophils Relative: 3 %
HCT: 33.9 % — ABNORMAL LOW (ref 36.0–46.0)
Hemoglobin: 10.2 g/dL — ABNORMAL LOW (ref 12.0–15.0)
Immature Granulocytes: 0 %
Lymphocytes Relative: 21 %
Lymphs Abs: 1 10*3/uL (ref 0.7–4.0)
MCH: 24.8 pg — ABNORMAL LOW (ref 26.0–34.0)
MCHC: 30.1 g/dL (ref 30.0–36.0)
MCV: 82.3 fL (ref 80.0–100.0)
Monocytes Absolute: 0.4 10*3/uL (ref 0.1–1.0)
Monocytes Relative: 8 %
Neutro Abs: 3.2 10*3/uL (ref 1.7–7.7)
Neutrophils Relative %: 67 %
Platelets: 142 10*3/uL — ABNORMAL LOW (ref 150–400)
RBC: 4.12 MIL/uL (ref 3.87–5.11)
RDW: 19 % — ABNORMAL HIGH (ref 11.5–15.5)
WBC: 4.7 10*3/uL (ref 4.0–10.5)
nRBC: 0 % (ref 0.0–0.2)

## 2019-04-12 LAB — BASIC METABOLIC PANEL
Anion gap: 9 (ref 5–15)
BUN: 17 mg/dL (ref 8–23)
CO2: 27 mmol/L (ref 22–32)
Calcium: 8.3 mg/dL — ABNORMAL LOW (ref 8.9–10.3)
Chloride: 107 mmol/L (ref 98–111)
Creatinine, Ser: 1.42 mg/dL — ABNORMAL HIGH (ref 0.44–1.00)
GFR calc Af Amer: 44 mL/min — ABNORMAL LOW (ref >60)
GFR calc non Af Amer: 38 mL/min — ABNORMAL LOW (ref >60)
Glucose, Bld: 132 mg/dL — ABNORMAL HIGH (ref 70–99)
Potassium: 3.5 mmol/L (ref 3.5–5.1)
Sodium: 143 mmol/L (ref 135–145)

## 2019-04-12 LAB — RENAL FUNCTION PANEL
Albumin: 2.6 g/dL — ABNORMAL LOW (ref 3.5–5.0)
Anion gap: 9 (ref 5–15)
BUN: 18 mg/dL (ref 8–23)
CO2: 27 mmol/L (ref 22–32)
Calcium: 8.2 mg/dL — ABNORMAL LOW (ref 8.9–10.3)
Chloride: 106 mmol/L (ref 98–111)
Creatinine, Ser: 1.39 mg/dL — ABNORMAL HIGH (ref 0.44–1.00)
GFR calc Af Amer: 46 mL/min — ABNORMAL LOW (ref >60)
GFR calc non Af Amer: 39 mL/min — ABNORMAL LOW (ref >60)
Glucose, Bld: 131 mg/dL — ABNORMAL HIGH (ref 70–99)
Phosphorus: 4 mg/dL (ref 2.5–4.6)
Potassium: 3.5 mmol/L (ref 3.5–5.1)
Sodium: 142 mmol/L (ref 135–145)

## 2019-04-12 LAB — GLUCOSE, CAPILLARY
Glucose-Capillary: 130 mg/dL — ABNORMAL HIGH (ref 70–99)
Glucose-Capillary: 111 mg/dL — ABNORMAL HIGH (ref 70–99)
Glucose-Capillary: 197 mg/dL — ABNORMAL HIGH (ref 70–99)

## 2019-04-12 NOTE — Progress Notes (Signed)
This note also relates to the following rows which could not be included: Pulse Rate - Cannot attach notes to unvalidated device data Resp - Cannot attach notes to unvalidated device data BP - Cannot attach notes to unvalidated device data SpO2 - Cannot attach notes to unvalidated device data  Pt stable HD tolerated well cvc wdl ufg achieved 2.5l

## 2019-04-12 NOTE — Progress Notes (Signed)
PROGRESS NOTE    Priscilla Villanueva  BOF:[id number] DOB: 1950/10/16 DOA: 04/10/2019 PCP: Tracie Harrier, MD   Brief Narrative:  68 year old African-American female with a past medical history notable for CKD stage III, chronic diastolic congestive heart failure, moderate severe pulmonary hypertension, insulin-dependent diabetes, hypertension, hyperlipidemia and CAD as well as history of colon cancer who was admitted directly by nephrology for management of progressive volume overload with possibility of dialysis.   Assessment & Plan:   Principal Problem:   Volume overload Active Problems:   CHF (congestive heart failure) (HCC)   Diabetes mellitus, type 2 (HCC)   Hypertension associated with diabetes ([place])   Hyperlipidemia associated with type 2 diabetes mellitus ([place])   CKD (chronic kidney disease), stage III   Hypervolemia with chronic diastolic heart failure moderate severe pulmonary hypertension: -HD to assist with volume removal -Continue to monitor strict I's and O's and daily weights -Dialysis catheter placement by vascular surgery 11/13 -Nephrology and vascular surgery involved  CKD stage III for: -Dialysis catheter as above, -Monitor electrolytes, cautiously replete -dialysis today for volume removal -Net 3.3 L output  Insulin-dependent diabetes type 2: -Continue reduced dose of Humalog -Sliding scale insulin light  Hypertension: -Continue patient's clonidine, hydralazine, Toprol -Holding ARB  Hyperlipidemia: -Continue statin  DVT prophylaxis: SCD/Compression stockings  Code Status: full    Code Status Orders  (From admission, onward)         Start     Ordered   04/10/19 1959  Full code  Continuous     04/10/19 1959        Code Status History    Date Active Date Inactive Code Status Order ID Comments User Context   04/02/2019 0942 04/02/2019 1359 Full Code [id number]  Yolonda Kida, MD Inpatient   03/11/2019 0540 03/13/2019 1646 Full Code  [id number]  Sidney Ace Arvella Merles, MD ED   01/26/2015 1435 01/26/2015 1800 Full Code [id number]  Schnier, Dolores Lory, MD Inpatient   Advance Care Planning Activity    Advance Directive Documentation     Most Recent Value  Type of Advance Directive  Living will, Healthcare Power of Attorney  Pre-existing out of facility DNR order (yellow form or pink MOST form)  -  "MOST" Form in Place?  -     Family Communication: Discussed in detail with patient she will self update family Disposition Plan:   Patient remained inpatient continued volume movable through dialysis.  Patient still remains volume overloaded is not stable for discharge Consults called: None Admission status: Inpatient   Consultants:   Nephrology and vascular surgery  Procedures:  Dg Chest 2 View  Result Date: 04/11/2019 CLINICAL DATA:  Pulmonary edema EXAM: CHEST - 2 VIEW COMPARISON:  03/11/2019 FINDINGS: Cardiomegaly. Mild, diffuse bilateral interstitial pulmonary opacity and trace pleural effusions. The visualized skeletal structures are unremarkable. IMPRESSION: 1. Mild, diffuse bilateral interstitial pulmonary opacity and trace pleural effusions, findings consistent with edema. No focal airspace opacity. 2.  Cardiomegaly. Electronically Signed   By: Eddie Candle M.D.   On: 04/11/2019 12:51   Mr Shoulder Right Wo Contrast  Result Date: 03/17/2019 CLINICAL DATA:  [place] 1 month ago. Persistent shoulder pain and painful range of motion. EXAM: MRI OF THE RIGHT SHOULDER WITHOUT CONTRAST TECHNIQUE: Multiplanar, multisequence MR imaging of the shoulder was performed. No intravenous contrast was administered. COMPARISON:  Shoulder radiographs 02/21/2019 FINDINGS: Rotator cuff: There is a full-thickness retracted tear involving the anterior aspect of the supraspinatus tendon. Maximum retraction is estimated at 15 mm and  the tear is approximately 15 mm wide. The subscapularis tendon is intact. Mild to moderate tendinopathy with small interstitial  tears. The subscapularis tendon is intact. Muscles: Mild diffuse fatty atrophy of the shoulder musculature. There is also a benign-appearing lipoma in the chest wall near the teres major muscle. Biceps long head:  Intact Acromioclavicular Joint: Mild degenerative changes. Type 2 acromion. No significant lateral downsloping or undersurface spurring. Glenohumeral Joint: Moderate degenerative changes with joint space narrowing, degenerative chondrosis, early spurring and small joint effusion. Labrum:  No definite labral tears. Bones:  No acute bony findings. Other: Expected fluid in the subacromial/subdeltoid bursa. IMPRESSION: 1. Full-thickness retracted supraspinatus tendon tear measuring approximately 15 x 15 mm. 2. Infraspinatus and subscapularis tendinopathy but no tears. 3. Intact long head biceps tendon and glenoid labrum. 4. No significant MR findings to suggest bony impingement. 5. Moderate glenohumeral joint degenerative changes. Electronically Signed   By: Marijo Sanes M.D.   On: 03/17/2019 13:42     Antimicrobials:   None   Subjective: Doing well overnight Seen in dialysis  Objective: Vitals:   04/12/19 1230 04/12/19 1245 04/12/19 1300 04/12/19 1510  BP: (!) 158/86 (!) 158/82  (!) 165/76  Pulse: 68 71  72  Resp: 18 19  18   Temp:   98.4 F (36.9 C) 98.9 F (37.2 C)  TempSrc:   Oral Oral  SpO2: 100% 100%  92%  Weight:      Height:        Intake/Output Summary (Last 24 hours) at 04/12/2019 1534 Last data filed at 04/12/2019 1245 Gross per 24 hour  Intake -  Output 4624 ml  Net -4624 ml   Filed Weights   04/10/19 1757  Weight: 112.5 kg    Examination:  General exam: Appears calm and comfortable  Respiratory system: Clear to auscultation. Respiratory effort normal. Cardiovascular system: S1 & S2 heard, RRR. No JVD, murmurs, rubs, gallops or clicks. No pedal edema. Gastrointestinal system: Abdomen is nondistended, soft and nontender. No organomegaly or masses felt.  Normal bowel sounds heard. Central nervous system: Alert and oriented. No focal neurological deficits. Extremities: Significant anasarca, warm well perfused Skin: No rashes, lesions or ulcers Psychiatry: Judgement and insight appear normal. Mood & affect appropriate.     Data Reviewed: I have personally reviewed following labs and imaging studies  CBC: Recent Labs  Lab 04/10/19 2053 04/11/19 0354 04/12/19 0833  WBC 5.6 5.1 4.7  NEUTROABS  --   --  3.2  HGB 11.6* 10.0* 10.2*  HCT 37.9 34.2* 33.9*  MCV 82.0 85.3 82.3  PLT 148* 153 469*   Basic Metabolic Panel: Recent Labs  Lab 04/10/19 2053 04/11/19 0354 04/12/19 0833  NA 145 145 142  143  K 3.4* 3.7 3.5  3.5  CL 105 109 106  107  CO2 28 28 27  27   GLUCOSE 203* 206* 131*  132*  BUN 22 21 18  17   CREATININE 1.45* 1.38* 1.39*  1.42*  CALCIUM 8.6* 8.3* 8.2*  8.3*  MG  --  1.8  --   PHOS  --   --  4.0   GFR: Estimated Creatinine Clearance: 47.9 mL/min (A) (by C-G formula based on SCr of 1.42 mg/dL (H)). Liver Function Tests: Recent Labs  Lab 04/12/19 0833  ALBUMIN 2.6*   No results for input(s): LIPASE, AMYLASE in the last 168 hours. No results for input(s): AMMONIA in the last 168 hours. Coagulation Profile: No results for input(s): INR, PROTIME in the last 168 hours.  Cardiac Enzymes: No results for input(s): CKTOTAL, CKMB, CKMBINDEX, TROPONINI in the last 168 hours. BNP (last 3 results) No results for input(s): PROBNP in the last 8760 hours. HbA1C: No results for input(s): HGBA1C in the last 72 hours. CBG: Recent Labs  Lab 04/11/19 1141 04/11/19 1902 04/11/19 2112 04/12/19 0745 04/12/19 1415  GLUCAP 169* 234* 224* 130* 111*   Lipid Profile: No results for input(s): CHOL, HDL, LDLCALC, TRIG, CHOLHDL, LDLDIRECT in the last 72 hours. Thyroid Function Tests: No results for input(s): TSH, T4TOTAL, FREET4, T3FREE, THYROIDAB in the last 72 hours. Anemia Panel: No results for input(s): VITAMINB12,  FOLATE, FERRITIN, TIBC, IRON, RETICCTPCT in the last 72 hours. Sepsis Labs: No results for input(s): PROCALCITON, LATICACIDVEN in the last 168 hours.  Recent Results (from the past 240 hour(s))  SARS CORONAVIRUS 2 (TAT 6-24 HRS) Nasopharyngeal Nasopharyngeal Swab     Status: None   Collection Time: 04/10/19  9:14 PM   Specimen: Nasopharyngeal Swab  Result Value Ref Range Status   SARS Coronavirus 2 NEGATIVE NEGATIVE Final    Comment: (NOTE) SARS-CoV-2 target nucleic acids are NOT DETECTED. The SARS-CoV-2 RNA is generally detectable in upper and lower respiratory specimens during the acute phase of infection. Negative results do not preclude SARS-CoV-2 infection, do not rule out co-infections with other pathogens, and should not be used as the sole basis for treatment or other patient management decisions. Negative results must be combined with clinical observations, patient history, and epidemiological information. The expected result is Negative. Fact Sheet for Patients: SugarRoll.be Fact Sheet for Healthcare Providers: https://www.woods-mathews.com/ This test is not yet approved or cleared by the [place] FDA and  has been authorized for detection and/or diagnosis of SARS-CoV-2 by FDA under an Emergency Use Authorization (EUA). This EUA will remain  in effect (meaning this test can be used) for the duration of the COVID-19 declaration under Section 56 4(b)(1) of the Act, 21 U.S.C. section 360bbb-3(b)(1), unless the authorization is terminated or revoked sooner. Performed at [facility], [place] [address], [place], [place] [address]          Radiology Studies: Dg Chest 2 View  Result Date: 04/11/2019 CLINICAL DATA:  Pulmonary edema EXAM: CHEST - 2 VIEW COMPARISON:  03/11/2019 FINDINGS: Cardiomegaly. Mild, diffuse bilateral interstitial pulmonary opacity and trace pleural effusions. The visualized skeletal structures are  unremarkable. IMPRESSION: 1. Mild, diffuse bilateral interstitial pulmonary opacity and trace pleural effusions, findings consistent with edema. No focal airspace opacity. 2.  Cardiomegaly. Electronically Signed   By: Eddie Candle M.D.   On: 04/11/2019 12:51        Scheduled Meds: . aspirin EC  81 mg Oral QPM  . atorvastatin  40 mg Oral QPM  . Chlorhexidine Gluconate Cloth  6 each Topical Q0600  . cloNIDine  0.3 mg Oral TID  . hydrALAZINE  100 mg Oral TID  . insulin aspart  0-5 Units Subcutaneous QHS  . insulin aspart  0-9 Units Subcutaneous TID WC  . insulin aspart protamine- aspart  14 Units Subcutaneous BID WC  . metoprolol  200 mg Oral Daily  . sodium chloride flush  3 mL Intravenous Q12H   Continuous Infusions: . furosemide (LASIX) infusion 5 mg/hr (04/11/19 0600)     LOS: 2 days    Time spent: 56 min    Nicolette Bang, MD [facility]  If 7PM-7AM, please contact night-coverage  04/12/2019, 3:34 PM

## 2019-04-12 NOTE — Progress Notes (Signed)
   04/12/19 1010  Neurological  Level of Consciousness Alert  Orientation Level Oriented X4  Respiratory  Respiratory Pattern Regular  Bilateral Breath Sounds Clear;Diminished  Cardiac  Pulse Regular  Heart Sounds S1, S2  ECG Monitor Yes  pt stable for HD no c/os no distress noted cvc wdl

## 2019-04-12 NOTE — Progress Notes (Signed)
[facility]  ROUNDING NOTE   Subjective:   Initial dialysis treatment yesterday. Tolerated treatment well. UF of  1.5 liters.   Furosemide gtt 5mg /hr UOP 1426mL recorded  Seen and examined on hemodialysis treatment. Second treatment. Tolerating treatment well.   Objective:  Vital signs in last 24 hours:  Temp:  [97.8 F (36.6 C)-98.9 F (37.2 C)] 97.9 F (36.6 C) (11/14 0820) Pulse Rate:  [64-91] 64 (11/14 0820) Resp:  [16-23] 18 (11/14 0820) BP: (140-207)/(67-91) 158/83 (11/14 0820) SpO2:  [86 %-100 %] 98 % (11/14 0820)  Weight change:  Filed Weights   04/10/19 1757  Weight: 112.5 kg    Intake/Output: I/O last 3 completed shifts: In: 36.1 [I.V.:36.1] Out: 9381 [Urine:1800; Other:1521]   Intake/Output this shift:  No intake/output data recorded.  Physical Exam: General: NAD, laying in bed  Head: Normocephalic, atraumatic. Moist oral mucosal membranes  Eyes: Anicteric, PERRL  Neck: Supple, trachea midline  Lungs:  Bilateral crackles, diminished at bases  Heart: Regular rate and rhythm  Abdomen:  abdominal wall edema   Extremities: + peripheral edema.  Neurologic: Nonfocal, moving all four extremities  Skin: No lesions  Access: none    Basic Metabolic Panel: Recent Labs  Lab 04/10/19 2053 04/11/19 0354  NA 145 145  K 3.4* 3.7  CL 105 109  CO2 28 28  GLUCOSE 203* 206*  BUN 22 21  CREATININE 1.45* 1.38*  CALCIUM 8.6* 8.3*  MG  --  1.8    Liver Function Tests: No results for input(s): AST, ALT, ALKPHOS, BILITOT, PROT, ALBUMIN in the last 168 hours. No results for input(s): LIPASE, AMYLASE in the last 168 hours. No results for input(s): AMMONIA in the last 168 hours.  CBC: Recent Labs  Lab 04/10/19 2053 04/11/19 0354  WBC 5.6 5.1  HGB 11.6* 10.0*  HCT 37.9 34.2*  MCV 82.0 85.3  PLT 148* 153    Cardiac Enzymes: No results for input(s): CKTOTAL, CKMB, CKMBINDEX, TROPONINI in the last 168 hours.  BNP: Invalid input(s):  POCBNP  CBG: Recent Labs  Lab 04/11/19 0751 04/11/19 1141 04/11/19 1902 04/11/19 2112 04/12/19 0745  GLUCAP 178* 169* 234* 224* 130*    Microbiology: Results for orders placed or performed during the hospital encounter of 04/10/19  SARS CORONAVIRUS 2 (TAT 6-24 HRS) Nasopharyngeal Nasopharyngeal Swab     Status: None   Collection Time: 04/10/19  9:14 PM   Specimen: Nasopharyngeal Swab  Result Value Ref Range Status   SARS Coronavirus 2 NEGATIVE NEGATIVE Final    Comment: (NOTE) SARS-CoV-2 target nucleic acids are NOT DETECTED. The SARS-CoV-2 RNA is generally detectable in upper and lower respiratory specimens during the acute phase of infection. Negative results do not preclude SARS-CoV-2 infection, do not rule out co-infections with other pathogens, and should not be used as the sole basis for treatment or other patient management decisions. Negative results must be combined with clinical observations, patient history, and epidemiological information. The expected result is Negative. Fact Sheet for Patients: SugarRoll.be Fact Sheet for Healthcare Providers: https://www.woods-mathews.com/ This test is not yet approved or cleared by the [place] FDA and  has been authorized for detection and/or diagnosis of SARS-CoV-2 by FDA under an Emergency Use Authorization (EUA). This EUA will remain  in effect (meaning this test can be used) for the duration of the COVID-19 declaration under Section 56 4(b)(1) of the Act, 21 U.S.C. section 360bbb-3(b)(1), unless the authorization is terminated or revoked sooner. Performed at [facility], [place]  [address], [place], [place] [address]     Coagulation Studies: No results for input(s): LABPROT, INR in the last 72 hours.  Urinalysis: No results for input(s): COLORURINE, LABSPEC, PHURINE, GLUCOSEU, HGBUR, BILIRUBINUR, KETONESUR, PROTEINUR, UROBILINOGEN, NITRITE, LEUKOCYTESUR in the last  72 hours.  Invalid input(s): APPERANCEUR    Imaging: Dg Chest 2 View  Result Date: 04/11/2019 CLINICAL DATA:  Pulmonary edema EXAM: CHEST - 2 VIEW COMPARISON:  03/11/2019 FINDINGS: Cardiomegaly. Mild, diffuse bilateral interstitial pulmonary opacity and trace pleural effusions. The visualized skeletal structures are unremarkable. IMPRESSION: 1. Mild, diffuse bilateral interstitial pulmonary opacity and trace pleural effusions, findings consistent with edema. No focal airspace opacity. 2.  Cardiomegaly. Electronically Signed   By: Eddie Candle M.D.   On: 04/11/2019 12:51     Medications:   . furosemide (LASIX) infusion 5 mg/hr (04/11/19 0600)   . aspirin EC  81 mg Oral QPM  . atorvastatin  40 mg Oral QPM  . Chlorhexidine Gluconate Cloth  6 each Topical Q0600  . cloNIDine  0.3 mg Oral TID  . hydrALAZINE  100 mg Oral TID  . insulin aspart  0-5 Units Subcutaneous QHS  . insulin aspart  0-9 Units Subcutaneous TID WC  . insulin aspart protamine- aspart  14 Units Subcutaneous BID WC  . metoprolol  200 mg Oral Daily  . sodium chloride flush  3 mL Intravenous Q12H   acetaminophen **OR** acetaminophen, ondansetron **OR** ondansetron (ZOFRAN) IV  Assessment/ Plan:  [name] is a 68 y.o. black female with hypertension, diabetes mellitus type II, hyperlipidemia, depression, history of colon cancer, coronary artery disease, congestive heart failure with diastolic disfunction, right rotator cuff trauma, hyperaldosteronism who is admitted from nephrology clinic on 04/10/2019 for Volume overload AKI CKD level 3  1. 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  Chronic kidney disease secondary to diabetic nephropathy Acute renal failure in 02/2019 was due to acute cardiorenal syndrome. Suspect the same on this admission.   2. Acute exacerbation of diastolic congestive heart failure. Failed outpatient diuretic  therapy.   3. Hypertension: with chronic kidney disease: home regimen of olmesartan, clonidinehydralazine, metoprolol and torsemide. Patient unable to tolerate spironolactone in the past.   4. Diabetes mellitus type II with chronic kidney disease: insulin dependent. Hemoglobin A1c of 7.2% on 03/11/2019 - holding metformin  Plan - Continue furosemide gtt - Due to extraordinary amount of volume, will schedule for hemodialysis for volume removal. Ultrafiltration only. Second dialysis treatment.   LOS: 2 Xayvier Vallez 11/14/20208:40 AM

## 2019-04-13 LAB — BASIC METABOLIC PANEL
Anion gap: 9 (ref 5–15)
BUN: 16 mg/dL (ref 8–23)
CO2: 27 mmol/L (ref 22–32)
Calcium: 8.1 mg/dL — ABNORMAL LOW (ref 8.9–10.3)
Chloride: 104 mmol/L (ref 98–111)
Creatinine, Ser: 1.56 mg/dL — ABNORMAL HIGH (ref 0.44–1.00)
GFR calc Af Amer: 40 mL/min — ABNORMAL LOW (ref >60)
GFR calc non Af Amer: 34 mL/min — ABNORMAL LOW (ref >60)
Glucose, Bld: 190 mg/dL — ABNORMAL HIGH (ref 70–99)
Potassium: 3.4 mmol/L — ABNORMAL LOW (ref 3.5–5.1)
Sodium: 140 mmol/L (ref 135–145)

## 2019-04-13 LAB — GLUCOSE, CAPILLARY
Glucose-Capillary: 173 mg/dL — ABNORMAL HIGH (ref 70–99)
Glucose-Capillary: 191 mg/dL — ABNORMAL HIGH (ref 70–99)
Glucose-Capillary: 281 mg/dL — ABNORMAL HIGH (ref 70–99)
Glucose-Capillary: 164 mg/dL — ABNORMAL HIGH (ref 70–99)

## 2019-04-13 MED ORDER — ALPRAZOLAM 0.25 MG PO TABS
0.2500 mg | ORAL_TABLET | Freq: Every evening | ORAL | Status: DC | PRN
  Administered 2019-04-13 – 2019-04-16 (×3): 0.25 mg via ORAL
  Filled 2019-04-13 (×3): qty 1

## 2019-04-13 MED ORDER — POTASSIUM CHLORIDE CRYS ER 20 MEQ PO TBCR
20.0000 meq | EXTENDED_RELEASE_TABLET | Freq: Once | ORAL | Status: AC
  Administered 2019-04-13: 20 meq via ORAL
  Filled 2019-04-13: qty 1

## 2019-04-13 NOTE — Progress Notes (Signed)
PROGRESS NOTE    Priscilla Villanueva  CHY:[id number] DOB: 31-Jul-1950 DOA: 04/10/2019 PCP: Tracie Harrier, MD   Brief Narrative:  68 year old African-American female with a past medical history notable for CKD stage III, chronic diastolic congestive heart failure, moderate severe pulmonary hypertension, insulin-dependent diabetes, hypertension, hyperlipidemia and CAD as well as history of colon cancer who was admitted directly by nephrology for management of progressive volume overload with possibility of dialysis   Assessment & Plan:   Principal Problem:   Volume overload Active Problems:   CHF (congestive heart failure) ([place])   Diabetes mellitus, type 2 ([place])   Hypertension associated with diabetes ([place])   Hyperlipidemia associated with type 2 diabetes mellitus ([place])   CKD (chronic kidney disease), stage III   Hypervolemiawith chronic diastolic heart failure moderate severe pulmonary hypertension: -c/w lasix gtt 5mg /hr -HD to assist with volume removal-day off today, resume monday -Continue to monitor strict I's and O's and daily weights -Dialysis catheter placement by vascular surgery 11/13 -Nephrology and vascular surgery involved  CKD stage III for: -Dialysis catheter as above, -Monitor electrolytes, cautiously replete -dialysis on Monday to cont volume removal -Net 5.7 L output with lasix gtt  Insulin-dependent diabetes type 2: -Continue reduced dose of Humalog -Sliding scale insulin light  Hypertension: -Continue patient's clonidine, hydralazine, Toprol -Holding ARB  Hyperlipidemia: -Continue statin  DVT prophylaxis: SCD/Compression stockings  Code Status: full    Code Status Orders  (From admission, onward)         Start     Ordered   04/10/19 1959  Full code  Continuous     04/10/19 1959        Code Status History    Date Active Date Inactive Code Status Order ID Comments User Context   04/02/2019 0942 04/02/2019 1359 Full Code [id number]   Yolonda Kida, MD Inpatient   03/11/2019 0540 03/13/2019 1646 Full Code [id number]  Sidney Ace Arvella Merles, MD ED   01/26/2015 1435 01/26/2015 1800 Full Code [id number]  Schnier, Dolores Lory, MD Inpatient   Advance Care Planning Activity    Advance Directive Documentation     Most Recent Value  Type of Advance Directive  Living will, Healthcare Power of Attorney  Pre-existing out of facility DNR order (yellow form or pink MOST form)  -  "MOST" Form in Place?  -     Family Communication: Discussed in detail with patient Disposition Plan:   Patient remained inpatient for continued dialysis with ultrafiltration for volume removal.  Patient still with significant volume overload not yet ready for medical discharge Consults called: None Admission status: Inpatient   Consultants:   Nephrology, vascular surgery  Procedures:  Dg Chest 2 View  Result Date: 04/11/2019 CLINICAL DATA:  Pulmonary edema EXAM: CHEST - 2 VIEW COMPARISON:  03/11/2019 FINDINGS: Cardiomegaly. Mild, diffuse bilateral interstitial pulmonary opacity and trace pleural effusions. The visualized skeletal structures are unremarkable. IMPRESSION: 1. Mild, diffuse bilateral interstitial pulmonary opacity and trace pleural effusions, findings consistent with edema. No focal airspace opacity. 2.  Cardiomegaly. Electronically Signed   By: Eddie Candle M.D.   On: 04/11/2019 12:51   Mr Shoulder Right Wo Contrast  Result Date: 03/17/2019 CLINICAL DATA:  [place] 1 month ago. Persistent shoulder pain and painful range of motion. EXAM: MRI OF THE RIGHT SHOULDER WITHOUT CONTRAST TECHNIQUE: Multiplanar, multisequence MR imaging of the shoulder was performed. No intravenous contrast was administered. COMPARISON:  Shoulder radiographs 02/21/2019 FINDINGS: Rotator cuff: There is a full-thickness retracted tear involving the anterior aspect of  the supraspinatus tendon. Maximum retraction is estimated at 15 mm and the tear is approximately 15 mm wide. The  subscapularis tendon is intact. Mild to moderate tendinopathy with small interstitial tears. The subscapularis tendon is intact. Muscles: Mild diffuse fatty atrophy of the shoulder musculature. There is also a benign-appearing lipoma in the chest wall near the teres major muscle. Biceps long head:  Intact Acromioclavicular Joint: Mild degenerative changes. Type 2 acromion. No significant lateral downsloping or undersurface spurring. Glenohumeral Joint: Moderate degenerative changes with joint space narrowing, degenerative chondrosis, early spurring and small joint effusion. Labrum:  No definite labral tears. Bones:  No acute bony findings. Other: Expected fluid in the subacromial/subdeltoid bursa. IMPRESSION: 1. Full-thickness retracted supraspinatus tendon tear measuring approximately 15 x 15 mm. 2. Infraspinatus and subscapularis tendinopathy but no tears. 3. Intact long head biceps tendon and glenoid labrum. 4. No significant MR findings to suggest bony impingement. 5. Moderate glenohumeral joint degenerative changes. Electronically Signed   By: Marijo Sanes M.D.   On: 03/17/2019 13:42     Antimicrobials:   None   Subjective: Reports feeling much better overnight Noted improvement with volume removal  Objective: Vitals:   04/13/19 0429 04/13/19 0630 04/13/19 0853 04/13/19 1126  BP:  (!) 161/84 (!) 170/79 118/64  Pulse:  71 79 73  Resp:  18    Temp:  98.6 F (37 C)  98.4 F (36.9 C)  TempSrc:  Oral  Oral  SpO2:  94%  100%  Weight: 107.5 kg     Height:        Intake/Output Summary (Last 24 hours) at 04/13/2019 1514 Last data filed at 04/13/2019 0855 Gross per 24 hour  Intake 3 ml  Output -  Net 3 ml   Filed Weights   04/10/19 1757 04/13/19 0429  Weight: 112.5 kg 107.5 kg    Examination:  General exam: Appears calm and comfortable  Respiratory system: Clear to auscultation. Respiratory effort normal. Cardiovascular system: S1 & S2 heard, RRR. No JVD, murmurs, rubs,  gallops or clicks. No pedal edema. Gastrointestinal system: Abdomen is nondistended, soft and nontender. No organomegaly or masses felt. Normal bowel sounds heard. Central nervous system: Alert and oriented. No focal neurological deficits. Extremities: Significant anasarca although improved from prior, warm well perfused Skin: No rashes, lesions or ulcers Psychiatry: Judgement and insight appear normal. Mood & affect appropriate..     Data Reviewed: I have personally reviewed following labs and imaging studies  CBC: Recent Labs  Lab 04/10/19 2053 04/11/19 0354 04/12/19 0833  WBC 5.6 5.1 4.7  NEUTROABS  --   --  3.2  HGB 11.6* 10.0* 10.2*  HCT 37.9 34.2* 33.9*  MCV 82.0 85.3 82.3  PLT 148* 153 283*   Basic Metabolic Panel: Recent Labs  Lab 04/10/19 2053 04/11/19 0354 04/12/19 0833 04/13/19 0538  NA 145 145 142  143 140  K 3.4* 3.7 3.5  3.5 3.4*  CL 105 109 106  107 104  CO2 28 28 27  27 27   GLUCOSE 203* 206* 131*  132* 190*  BUN 22 21 18  17 16   CREATININE 1.45* 1.38* 1.39*  1.42* 1.56*  CALCIUM 8.6* 8.3* 8.2*  8.3* 8.1*  MG  --  1.8  --   --   PHOS  --   --  4.0  --    GFR: Estimated Creatinine Clearance: 42.4 mL/min (A) (by C-G formula based on SCr of 1.56 mg/dL (H)). Liver Function Tests: Recent Labs  Lab 04/12/19 [phone]  ALBUMIN 2.6*   No results for input(s): LIPASE, AMYLASE in the last 168 hours. No results for input(s): AMMONIA in the last 168 hours. Coagulation Profile: No results for input(s): INR, PROTIME in the last 168 hours. Cardiac Enzymes: No results for input(s): CKTOTAL, CKMB, CKMBINDEX, TROPONINI in the last 168 hours. BNP (last 3 results) No results for input(s): PROBNP in the last 8760 hours. HbA1C: No results for input(s): HGBA1C in the last 72 hours. CBG: Recent Labs  Lab 04/12/19 0745 04/12/19 1415 04/12/19 2056 04/13/19 0746 04/13/19 1122  GLUCAP 130* 111* 197* 173* 191*   Lipid Profile: No results for input(s): CHOL,  HDL, LDLCALC, TRIG, CHOLHDL, LDLDIRECT in the last 72 hours. Thyroid Function Tests: No results for input(s): TSH, T4TOTAL, FREET4, T3FREE, THYROIDAB in the last 72 hours. Anemia Panel: No results for input(s): VITAMINB12, FOLATE, FERRITIN, TIBC, IRON, RETICCTPCT in the last 72 hours. Sepsis Labs: No results for input(s): PROCALCITON, LATICACIDVEN in the last 168 hours.  Recent Results (from the past 240 hour(s))  SARS CORONAVIRUS 2 (TAT 6-24 HRS) Nasopharyngeal Nasopharyngeal Swab     Status: None   Collection Time: 04/10/19  9:14 PM   Specimen: Nasopharyngeal Swab  Result Value Ref Range Status   SARS Coronavirus 2 NEGATIVE NEGATIVE Final    Comment: (NOTE) SARS-CoV-2 target nucleic acids are NOT DETECTED. The SARS-CoV-2 RNA is generally detectable in upper and lower respiratory specimens during the acute phase of infection. Negative results do not preclude SARS-CoV-2 infection, do not rule out co-infections with other pathogens, and should not be used as the sole basis for treatment or other patient management decisions. Negative results must be combined with clinical observations, patient history, and epidemiological information. The expected result is Negative. Fact Sheet for Patients: SugarRoll.be Fact Sheet for Healthcare Providers: https://www.woods-mathews.com/ This test is not yet approved or cleared by the [place] FDA and  has been authorized for detection and/or diagnosis of SARS-CoV-2 by FDA under an Emergency Use Authorization (EUA). This EUA will remain  in effect (meaning this test can be used) for the duration of the COVID-19 declaration under Section 56 4(b)(1) of the Act, 21 U.S.C. section 360bbb-3(b)(1), unless the authorization is terminated or revoked sooner. Performed at [facility], [place] [address], [place], [place] [address]          Radiology Studies: No results found.      Scheduled  Meds: . aspirin EC  81 mg Oral QPM  . atorvastatin  40 mg Oral QPM  . Chlorhexidine Gluconate Cloth  6 each Topical Q0600  . cloNIDine  0.3 mg Oral TID  . hydrALAZINE  100 mg Oral TID  . insulin aspart  0-5 Units Subcutaneous QHS  . insulin aspart  0-9 Units Subcutaneous TID WC  . insulin aspart protamine- aspart  14 Units Subcutaneous BID WC  . metoprolol  200 mg Oral Daily  . sodium chloride flush  3 mL Intravenous Q12H   Continuous Infusions: . furosemide (LASIX) infusion 5 mg/hr (04/13/19 0258)     LOS: 3 days    Time spent: 38 min    Nicolette Bang, MD [facility]  If 7PM-7AM, please contact night-coverage  04/13/2019, 3:14 PM  \

## 2019-04-13 NOTE — Progress Notes (Signed)
[facility]  ROUNDING NOTE   Subjective:   Second dialysis treatment yesterday. Tolerated treatment well. Sequential only. UF of  2.5 liters.   Furosemide gtt 5mg /hr   Wt 107.45kg ( 112.5kg on admission)  Objective:  Vital signs in last 24 hours:  Temp:  [98.4 F (36.9 C)-99.1 F (37.3 C)] 98.4 F (36.9 C) (11/15 1126) Pulse Rate:  [71-79] 73 (11/15 1126) Resp:  [18] 18 (11/15 0630) BP: (118-170)/(64-84) 118/64 (11/15 1126) SpO2:  [90 %-100 %] 100 % (11/15 1126) Weight:  [107.5 kg] 107.5 kg (11/15 0429)  Weight change:  Filed Weights   04/10/19 1757 04/13/19 0429  Weight: 112.5 kg 107.5 kg    Intake/Output: I/O last 3 completed shifts: In: -  Out: 3103 [Urine:600; Other:2503]   Intake/Output this shift:  Total I/O In: 3 [I.V.:3] Out: -   Physical Exam: General: NAD, laying in bed  Head: Normocephalic, atraumatic. Moist oral mucosal membranes  Eyes: Anicteric, PERRL  Neck: Supple, trachea midline  Lungs:  Bilateral crackles, diminished at bases  Heart: Regular rate and rhythm  Abdomen:  abdominal wall edema   Extremities: + peripheral edema.  Neurologic: Nonfocal, moving all four extremities  Skin: No lesions  Access: Right femoral temp catheter 11/14 Dr. Lucky Cowboy    Basic Metabolic Panel: Recent Labs  Lab 04/10/19 2053 04/11/19 0354 04/12/19 0833 04/13/19 0538  NA 145 145 142  143 140  K 3.4* 3.7 3.5  3.5 3.4*  CL 105 109 106  107 104  CO2 28 28 27  27 27   GLUCOSE 203* 206* 131*  132* 190*  BUN 22 21 18  17 16   CREATININE 1.45* 1.38* 1.39*  1.42* 1.56*  CALCIUM 8.6* 8.3* 8.2*  8.3* 8.1*  MG  --  1.8  --   --   PHOS  --   --  4.0  --     Liver Function Tests: Recent Labs  Lab 04/12/19 0833  ALBUMIN 2.6*   No results for input(s): LIPASE, AMYLASE in the last 168 hours. No results for input(s): AMMONIA in the last 168 hours.  CBC: Recent Labs  Lab 04/10/19 2053 04/11/19 0354 04/12/19 0833  WBC 5.6 5.1 4.7   NEUTROABS  --   --  3.2  HGB 11.6* 10.0* 10.2*  HCT 37.9 34.2* 33.9*  MCV 82.0 85.3 82.3  PLT 148* 153 142*    Cardiac Enzymes: No results for input(s): CKTOTAL, CKMB, CKMBINDEX, TROPONINI in the last 168 hours.  BNP: Invalid input(s): POCBNP  CBG: Recent Labs  Lab 04/12/19 0745 04/12/19 1415 04/12/19 2056 04/13/19 0746 04/13/19 1122  GLUCAP 130* 111* 197* 173* 191*    Microbiology: Results for orders placed or performed during the hospital encounter of 04/10/19  SARS CORONAVIRUS 2 (TAT 6-24 HRS) Nasopharyngeal Nasopharyngeal Swab     Status: None   Collection Time: 04/10/19  9:14 PM   Specimen: Nasopharyngeal Swab  Result Value Ref Range Status   SARS Coronavirus 2 NEGATIVE NEGATIVE Final    Comment: (NOTE) SARS-CoV-2 target nucleic acids are NOT DETECTED. The SARS-CoV-2 RNA is generally detectable in upper and lower respiratory specimens during the acute phase of infection. Negative results do not preclude SARS-CoV-2 infection, do not rule out co-infections with other pathogens, and should not be used as the sole basis for treatment or other patient management decisions. Negative results must be combined with clinical observations, patient history, and epidemiological information. The expected result is Negative. Fact Sheet for Patients: SugarRoll.be Fact Sheet for  Healthcare Providers: https://www.woods-mathews.com/ This test is not yet approved or cleared by the [place] and  has been authorized for detection and/or diagnosis of SARS-CoV-2 by FDA under an Emergency Use Authorization (EUA). This EUA will remain  in effect (meaning this test can be used) for the duration of the COVID-19 declaration under Section 56 4(b)(1) of the Act, 21 U.S.C. section 360bbb-3(b)(1), unless the authorization is terminated or revoked sooner. Performed at [facility], [place] [address], [place], [place] [address]      Coagulation Studies: No results for input(s): LABPROT, INR in the last 72 hours.  Urinalysis: No results for input(s): COLORURINE, LABSPEC, PHURINE, GLUCOSEU, HGBUR, BILIRUBINUR, KETONESUR, PROTEINUR, UROBILINOGEN, NITRITE, LEUKOCYTESUR in the last 72 hours.  Invalid input(s): APPERANCEUR    Imaging: Dg Chest 2 View  Result Date: 04/11/2019 CLINICAL DATA:  Pulmonary edema EXAM: CHEST - 2 VIEW COMPARISON:  03/11/2019 FINDINGS: Cardiomegaly. Mild, diffuse bilateral interstitial pulmonary opacity and trace pleural effusions. The visualized skeletal structures are unremarkable. IMPRESSION: 1. Mild, diffuse bilateral interstitial pulmonary opacity and trace pleural effusions, findings consistent with edema. No focal airspace opacity. 2.  Cardiomegaly. Electronically Signed   By: Eddie Candle M.D.   On: 04/11/2019 12:51     Medications:   . furosemide (LASIX) infusion 5 mg/hr (04/13/19 0258)   . aspirin EC  81 mg Oral QPM  . atorvastatin  40 mg Oral QPM  . Chlorhexidine Gluconate Cloth  6 each Topical Q0600  . cloNIDine  0.3 mg Oral TID  . hydrALAZINE  100 mg Oral TID  . insulin aspart  0-5 Units Subcutaneous QHS  . insulin aspart  0-9 Units Subcutaneous TID WC  . insulin aspart protamine- aspart  14 Units Subcutaneous BID WC  . metoprolol  200 mg Oral Daily  . sodium chloride flush  3 mL Intravenous Q12H   acetaminophen **OR** acetaminophen, ondansetron **OR** ondansetron (ZOFRAN) IV  Assessment/ Plan:  Ms. BRIEA MCENERY is a 68 y.o. black female with hypertension, diabetes mellitus type II, hyperlipidemia, depression, history of colon cancer, coronary artery disease, congestive heart failure with diastolic disfunction, right rotator cuff trauma, hyperaldosteronism who is admitted from nephrology clinic on 04/10/2019 for Volume overload AKI CKD level 3  1. 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  Chronic kidney disease secondary to diabetic nephropathy Acute renal failure due to acute cardiorenal syndrome.   2. Acute exacerbation of diastolic congestive heart failure. Failed outpatient diuretic therapy.   3. Hypertension: with chronic kidney disease: home regimen of olmesartan, clonidinehydralazine, metoprolol and torsemide. Patient unable to tolerate spironolactone in the past.   4. Diabetes mellitus type II with chronic kidney disease: insulin dependent. Hemoglobin A1c of 7.2% on 03/11/2019 - holding metformin  Plan - Continue furosemide gtt - Due to extraordinary amount of volume, will schedule for hemodialysis for volume removal. Ultrafiltration only. Third dialysis treatment for tomorrow - Consider epleronone on discharge.    LOS: 3 Arham Symmonds 11/15/202012:48 PM

## 2019-04-14 ENCOUNTER — Encounter: Payer: Self-pay | Admitting: Vascular Surgery

## 2019-04-14 DIAGNOSIS — I2721 Secondary pulmonary arterial hypertension: Secondary | ICD-10-CM

## 2019-04-14 DIAGNOSIS — I5033 Acute on chronic diastolic (congestive) heart failure: Secondary | ICD-10-CM

## 2019-04-14 DIAGNOSIS — N1832 Chronic kidney disease, stage 3b: Secondary | ICD-10-CM

## 2019-04-14 LAB — BLOOD GAS, ARTERIAL
Acid-Base Excess: 4.2 mmol/L — ABNORMAL HIGH (ref 0.0–2.0)
Bicarbonate: 27.3 mmol/L (ref 20.0–28.0)
FIO2: 0.21
O2 Saturation: 91.4 %
Patient temperature: 37
pCO2 arterial: 35 mmHg (ref 32.0–48.0)
pH, Arterial: 7.5 — ABNORMAL HIGH (ref 7.350–7.450)
pO2, Arterial: 56 mmHg — ABNORMAL LOW (ref 83.0–108.0)

## 2019-04-14 LAB — BASIC METABOLIC PANEL
Anion gap: 9 (ref 5–15)
BUN: 19 mg/dL (ref 8–23)
CO2: 28 mmol/L (ref 22–32)
Calcium: 8.2 mg/dL — ABNORMAL LOW (ref 8.9–10.3)
Chloride: 104 mmol/L (ref 98–111)
Creatinine, Ser: 1.51 mg/dL — ABNORMAL HIGH (ref 0.44–1.00)
GFR calc Af Amer: 41 mL/min — ABNORMAL LOW (ref >60)
GFR calc non Af Amer: 36 mL/min — ABNORMAL LOW (ref >60)
Glucose, Bld: 155 mg/dL — ABNORMAL HIGH (ref 70–99)
Potassium: 3.2 mmol/L — ABNORMAL LOW (ref 3.5–5.1)
Sodium: 141 mmol/L (ref 135–145)

## 2019-04-14 LAB — CBC
HCT: 33.6 % — ABNORMAL LOW (ref 36.0–46.0)
Hemoglobin: 10.2 g/dL — ABNORMAL LOW (ref 12.0–15.0)
MCH: 25.2 pg — ABNORMAL LOW (ref 26.0–34.0)
MCHC: 30.4 g/dL (ref 30.0–36.0)
MCV: 83.2 fL (ref 80.0–100.0)
Platelets: 132 K/uL — ABNORMAL LOW (ref 150–400)
RBC: 4.04 MIL/uL (ref 3.87–5.11)
RDW: 18.9 % — ABNORMAL HIGH (ref 11.5–15.5)
WBC: 4.7 K/uL (ref 4.0–10.5)
nRBC: 0 % (ref 0.0–0.2)

## 2019-04-14 LAB — QUANTIFERON-TB GOLD PLUS: QuantiFERON-TB Gold Plus: NEGATIVE

## 2019-04-14 LAB — QUANTIFERON-TB GOLD PLUS (RQFGPL)
QuantiFERON Mitogen Value: >10 IU/mL
QuantiFERON Nil Value: 0.01 IU/mL
QuantiFERON TB1 Ag Value: 0.01 IU/mL
QuantiFERON TB2 Ag Value: 0.02 IU/mL

## 2019-04-14 LAB — GLUCOSE, CAPILLARY
Glucose-Capillary: 160 mg/dL — ABNORMAL HIGH (ref 70–99)
Glucose-Capillary: 167 mg/dL — ABNORMAL HIGH (ref 70–99)
Glucose-Capillary: 119 mg/dL — ABNORMAL HIGH (ref 70–99)

## 2019-04-14 LAB — MAGNESIUM: Magnesium: 1.8 mg/dL (ref 1.7–2.4)

## 2019-04-14 LAB — SEDIMENTATION RATE: Sed Rate: 28 mm/hr (ref 0–30)

## 2019-04-14 MED ORDER — POLYETHYLENE GLYCOL 3350 17 G PO PACK
17.0000 g | PACK | Freq: Every day | ORAL | Status: DC | PRN
  Administered 2019-04-14: 17 g via ORAL
  Filled 2019-04-14: qty 1

## 2019-04-14 MED ORDER — ALBUMIN HUMAN 25 % IV SOLN
12.5000 g | Freq: Every day | INTRAVENOUS | Status: DC
  Administered 2019-04-14 – 2019-04-16 (×3): 12.5 g via INTRAVENOUS
  Filled 2019-04-14 (×3): qty 50

## 2019-04-14 MED ORDER — POTASSIUM CHLORIDE CRYS ER 20 MEQ PO TBCR: 40.0000 meq | EXTENDED_RELEASE_TABLET | Freq: Once | ORAL | Status: DC

## 2019-04-14 MED ORDER — POTASSIUM CHLORIDE CRYS ER 20 MEQ PO TBCR
20.0000 meq | EXTENDED_RELEASE_TABLET | Freq: Once | ORAL | Status: AC
  Administered 2019-04-14: 20 meq via ORAL
  Filled 2019-04-14: qty 1

## 2019-04-14 MED ORDER — SPIRONOLACTONE 25 MG PO TABS: 25.0000 mg | ORAL_TABLET | Freq: Every day | ORAL | Status: DC

## 2019-04-14 MED ORDER — BISACODYL 5 MG PO TBEC: 5.0000 mg | DELAYED_RELEASE_TABLET | Freq: Every day | ORAL | Status: DC | PRN

## 2019-04-14 MED ORDER — SPIRONOLACTONE 25 MG PO TABS
25.0000 mg | ORAL_TABLET | Freq: Every day | ORAL | Status: DC
  Administered 2019-04-14: 25 mg via ORAL
  Filled 2019-04-14 (×3): qty 1

## 2019-04-14 MED ORDER — MAGNESIUM SULFATE 2 GM/50ML IV SOLN
2.0000 g | Freq: Once | INTRAVENOUS | Status: AC
  Administered 2019-04-14: 2 g via INTRAVENOUS
  Filled 2019-04-14: qty 50

## 2019-04-14 MED ORDER — HYDRALAZINE HCL 25 MG PO TABS
25.0000 mg | ORAL_TABLET | Freq: Three times a day (TID) | ORAL | Status: DC
  Administered 2019-04-14 – 2019-04-17 (×8): 25 mg via ORAL
  Filled 2019-04-14 (×8): qty 1

## 2019-04-14 NOTE — Care Management Important Message (Signed)
Important Message  Patient Details  Name: Priscilla Villanueva MRN: [id number] Date of Birth: 1951/01/07   Medicare Important Message Given:  Yes     [name] 04/14/2019, 12:17 PM

## 2019-04-14 NOTE — Progress Notes (Signed)
Pre HD Tx Assessment   04/14/19 0935  Neurological  Level of Consciousness Alert  Orientation Level Oriented X4  Respiratory  Respiratory Pattern Regular;Unlabored  Chest Assessment Chest expansion symmetrical  Bilateral Breath Sounds Clear;Diminished  Cough None  Cardiac  Pulse Regular  Heart Sounds S1, S2  Jugular Venous Distention (JVD) No  ECG Monitor Yes  Cardiac Rhythm NSR  Antiarrhythmic device No  Vascular  R Radial Pulse +2  L Radial Pulse +2  Edema Generalized  Generalized Edema +1  RLE Edema +2  LLE Edema +2  Integumentary  Integumentary (WDL) WDL  Musculoskeletal  Musculoskeletal (WDL) X  Generalized Weakness Yes  Gastrointestinal  Bowel Sounds Assessment Active  Last BM Date 04/12/19  GU Assessment  Genitourinary (WDL) X  Genitourinary Symptoms External catheter  Psychosocial  Psychosocial (WDL) WDL

## 2019-04-14 NOTE — Progress Notes (Signed)
PROGRESS NOTE    Priscilla Villanueva  HWE:[id number] DOB: [birth year] DOA: 04/10/2019 PCP: Tracie Harrier, MD   Brief Narrative:  68 year old African-American female with a past medical history notable for CKD stage III, chronic diastolic congestive heart failure, moderate severe pulmonary hypertension, insulin-dependent diabetes, hypertension, hyperlipidemia and CAD as well as history of colon cancer who was admitted directly by nephrology for management of progressive volume overload with possibility of dialysis   Assessment & Plan:   Principal Problem:   Volume overload Active Problems:   CHF (congestive heart failure) ([place])   Diabetes mellitus, type 2 ([place])   Hypertension associated with diabetes ([place])   Hyperlipidemia associated with type 2 diabetes mellitus ([place])   CKD (chronic kidney disease), stage III   Hypervolemiawith chronic diastolic heart failure moderate severe pulmonary hypertension: -c/w lasix gtt 5mg /hr  -HD to assist with volume removal-day off today, resume today -Continue to monitor strict I's and O's and daily weights -Dialysis catheter placement by vascular surgery11/13 -Nephrology and vascular surgery involved  CKD stage III for: -Dialysis catheter as above, -Monitor electrolytes, cautiously replete -dialysison Monday to cont volume removal -Net7.2 L output with lasix gtt  Insulin-dependent diabetes type 2: -Continue reduced dose of Humalog -Sliding scale insulin light  Hypertension: -Continue patient's clonidine, hydralazine, Toprol -Holding ARB  Hyperlipidemia: -Continue statin  DVT prophylaxis: SCD/Compression stockings  Code Status: full    Code Status Orders  (From admission, onward)         Start     Ordered   04/10/19 1959  Full code  Continuous     04/10/19 1959        Code Status History    Date Active Date Inactive Code Status Order ID Comments User Context   04/02/2019 0942 04/02/2019 1359 Full Code [id number]   Yolonda Kida, MD Inpatient   03/11/2019 0540 03/13/2019 1646 Full Code [id number]  Sidney Ace Arvella Merles, MD ED   01/26/2015 1435 01/26/2015 1800 Full Code [id number]  Schnier, Dolores Lory, MD Inpatient   Advance Care Planning Activity    Advance Directive Documentation     Most Recent Value  Type of Advance Directive  Living will, Healthcare Power of Attorney  Pre-existing out of facility DNR order (yellow form or pink MOST form)  -  "MOST" Form in Place?  -     Family Communication: Discussed in detail with patient  Disposition Plan:   Patient remained inpatient for continued dialysis with ultrafiltration for volume removal.  Patient still with significant volume overload not yet ready for medical discharge Consults called: None Admission status: Inpatient   Consultants:   Nephrology, vascular surgery  Procedures:  Dg Chest 2 View  Result Date: 04/11/2019 CLINICAL DATA:  Pulmonary edema EXAM: CHEST - 2 VIEW COMPARISON:  03/11/2019 FINDINGS: Cardiomegaly. Mild, diffuse bilateral interstitial pulmonary opacity and trace pleural effusions. The visualized skeletal structures are unremarkable. IMPRESSION: 1. Mild, diffuse bilateral interstitial pulmonary opacity and trace pleural effusions, findings consistent with edema. No focal airspace opacity. 2.  Cardiomegaly. Electronically Signed   By: Eddie Candle M.D.   On: 04/11/2019 12:51   Mr Shoulder Right Wo Contrast  Result Date: 03/17/2019 CLINICAL DATA:  [place] 1 month ago. Persistent shoulder pain and painful range of motion. EXAM: MRI OF THE RIGHT SHOULDER WITHOUT CONTRAST TECHNIQUE: Multiplanar, multisequence MR imaging of the shoulder was performed. No intravenous contrast was administered. COMPARISON:  Shoulder radiographs 02/21/2019 FINDINGS: Rotator cuff: There is a full-thickness retracted tear involving the anterior aspect of the  supraspinatus tendon. Maximum retraction is estimated at 15 mm and the tear is approximately 15 mm wide. The  subscapularis tendon is intact. Mild to moderate tendinopathy with small interstitial tears. The subscapularis tendon is intact. Muscles: Mild diffuse fatty atrophy of the shoulder musculature. There is also a benign-appearing lipoma in the chest wall near the teres major muscle. Biceps long head:  Intact Acromioclavicular Joint: Mild degenerative changes. Type 2 acromion. No significant lateral downsloping or undersurface spurring. Glenohumeral Joint: Moderate degenerative changes with joint space narrowing, degenerative chondrosis, early spurring and small joint effusion. Labrum:  No definite labral tears. Bones:  No acute bony findings. Other: Expected fluid in the subacromial/subdeltoid bursa. IMPRESSION: 1. Full-thickness retracted supraspinatus tendon tear measuring approximately 15 x 15 mm. 2. Infraspinatus and subscapularis tendinopathy but no tears. 3. Intact long head biceps tendon and glenoid labrum. 4. No significant MR findings to suggest bony impingement. 5. Moderate glenohumeral joint degenerative changes. Electronically Signed   By: Marijo Sanes M.D.   On: 03/17/2019 13:42     Antimicrobials:   none   Subjective: Reports she continues to feel better as volume is removed, Currently net -7.2 L on Lasix drip as well as additional volume removal through ultrafiltration  Objective: Vitals:   04/14/19 1130 04/14/19 1145 04/14/19 1200 04/14/19 1215  BP: (!) 149/62 (!) 165/94 (!) 161/81 (!) 149/91  Pulse: 62 66 64 64  Resp: 17 16 20 17   Temp:      TempSrc:      SpO2:      Weight:      Height:        Intake/Output Summary (Last 24 hours) at 04/14/2019 1229 Last data filed at 04/14/2019 0844 Gross per 24 hour  Intake 173.95 ml  Output 1650 ml  Net -1476.05 ml   Filed Weights   04/10/19 1757 04/13/19 0429 04/14/19 0500  Weight: 112.5 kg 107.5 kg 107.4 kg    Examination:  General exam:Appears calm and comfortable  Respiratory system: Clear to auscultation. Respiratory  effort normal. Cardiovascular system:S1 &S2 heard, RRR. No JVD, murmurs, rubs, gallops or clicks. No pedal edema. Gastrointestinal system:Abdomen is nondistended, soft and nontender. No organomegaly or masses felt. Normal bowel sounds heard. Central nervous system:Alert and oriented. No focal neurological deficits. Extremities:Significant anasarca  continues to improve from prior, warm well perfused Skin: No rashes, lesions or ulcers Psychiatry:Judgement and insight appear normal. Mood &affect appropriate..      Data Reviewed: I have personally reviewed following labs and imaging studies  CBC: Recent Labs  Lab 04/10/19 2053 04/11/19 0354 04/12/19 0833 04/14/19 0557  WBC 5.6 5.1 4.7 4.7  NEUTROABS  --   --  3.2  --   HGB 11.6* 10.0* 10.2* 10.2*  HCT 37.9 34.2* 33.9* 33.6*  MCV 82.0 85.3 82.3 83.2  PLT 148* 153 142* 390*   Basic Metabolic Panel: Recent Labs  Lab 04/10/19 2053 04/11/19 0354 04/12/19 0833 04/13/19 0538 04/14/19 0557  NA 145 145 142  143 140 141  K 3.4* 3.7 3.5  3.5 3.4* 3.2*  CL 105 109 106  107 104 104  CO2 28 28 27  27 27 28   GLUCOSE 203* 206* 131*  132* 190* 155*  BUN 22 21 18  17 16 19   CREATININE 1.45* 1.38* 1.39*  1.42* 1.56* 1.51*  CALCIUM 8.6* 8.3* 8.2*  8.3* 8.1* 8.2*  MG  --  1.8  --   --  1.8  PHOS  --   --  4.0  --   --  GFR: Estimated Creatinine Clearance: 43.9 mL/min (A) (by C-G formula based on SCr of 1.51 mg/dL (H)). Liver Function Tests: Recent Labs  Lab 04/12/19 0833  ALBUMIN 2.6*   No results for input(s): LIPASE, AMYLASE in the last 168 hours. No results for input(s): AMMONIA in the last 168 hours. Coagulation Profile: No results for input(s): INR, PROTIME in the last 168 hours. Cardiac Enzymes: No results for input(s): CKTOTAL, CKMB, CKMBINDEX, TROPONINI in the last 168 hours. BNP (last 3 results) No results for input(s): PROBNP in the last 8760 hours. HbA1C: No results for input(s): HGBA1C in the last 72  hours. CBG: Recent Labs  Lab 04/13/19 0746 04/13/19 1122 04/13/19 1650 04/13/19 2111 04/14/19 0731  GLUCAP 173* 191* 281* 164* 160*   Lipid Profile: No results for input(s): CHOL, HDL, LDLCALC, TRIG, CHOLHDL, LDLDIRECT in the last 72 hours. Thyroid Function Tests: No results for input(s): TSH, T4TOTAL, FREET4, T3FREE, THYROIDAB in the last 72 hours. Anemia Panel: No results for input(s): VITAMINB12, FOLATE, FERRITIN, TIBC, IRON, RETICCTPCT in the last 72 hours. Sepsis Labs: No results for input(s): PROCALCITON, LATICACIDVEN in the last 168 hours.  Recent Results (from the past 240 hour(s))  SARS CORONAVIRUS 2 (TAT 6-24 HRS) Nasopharyngeal Nasopharyngeal Swab     Status: None   Collection Time: 04/10/19  9:14 PM   Specimen: Nasopharyngeal Swab  Result Value Ref Range Status   SARS Coronavirus 2 NEGATIVE NEGATIVE Final    Comment: (NOTE) SARS-CoV-2 target nucleic acids are NOT DETECTED. The SARS-CoV-2 RNA is generally detectable in upper and lower respiratory specimens during the acute phase of infection. Negative results do not preclude SARS-CoV-2 infection, do not rule out co-infections with other pathogens, and should not be used as the sole basis for treatment or other patient management decisions. Negative results must be combined with clinical observations, patient history, and epidemiological information. The expected result is Negative. Fact Sheet for Patients: SugarRoll.be Fact Sheet for Healthcare Providers: https://www.woods-mathews.com/ This test is not yet approved or cleared by the [place] FDA and  has been authorized for detection and/or diagnosis of SARS-CoV-2 by FDA under an Emergency Use Authorization (EUA). This EUA will remain  in effect (meaning this test can be used) for the duration of the COVID-19 declaration under Section 56 4(b)(1) of the Act, 21 U.S.C. section 360bbb-3(b)(1), unless the authorization  is terminated or revoked sooner. Performed at [facility], [place] [address], [place], [place] [address]          Radiology Studies: No results found.      Scheduled Meds: . aspirin EC  81 mg Oral QPM  . atorvastatin  40 mg Oral QPM  . Chlorhexidine Gluconate Cloth  6 each Topical Q0600  . cloNIDine  0.3 mg Oral TID  . hydrALAZINE  25 mg Oral TID  . insulin aspart  0-5 Units Subcutaneous QHS  . insulin aspart  0-9 Units Subcutaneous TID WC  . insulin aspart protamine- aspart  14 Units Subcutaneous BID WC  . metoprolol  200 mg Oral Daily  . sodium chloride flush  3 mL Intravenous Q12H  . spironolactone  25 mg Oral Daily   Continuous Infusions: . albumin human    . furosemide (LASIX) infusion 5 mg/hr (04/14/19 1308)     LOS: 4 days    Time spent: 52 min     Nicolette Bang, MD [facility]  If 7PM-7AM, please contact night-coverage  04/14/2019, 12:29 PM

## 2019-04-14 NOTE — Progress Notes (Signed)
HD Tx Completed    04/14/19 1250  Vital Signs  Pulse Rate 63  Resp (!) 23  BP (!) 154/85  During Hemodialysis Assessment  Blood Flow Rate (mL/min) 300 mL/min  Arterial Pressure (mmHg) -150 mmHg  Venous Pressure (mmHg) 150 mmHg  Transmembrane Pressure (mmHg) 10 mmHg  Ultrafiltration Rate (mL/min) 1000 mL/min  Dialysate Flow Rate (mL/min) 0 ml/min  Conductivity: Machine  14  HD Safety Checks Performed Yes  Intra-Hemodialysis Comments Tx completed

## 2019-04-14 NOTE — Progress Notes (Signed)
HD Tx Start    04/14/19 0945  Vital Signs  Pulse Rate 66  Resp (!) 22  BP (!) 150/80  Oxygen Therapy  SpO2 96 %  O2 Device Room Air  Pain Assessment  Pain Scale 0-10  Pain Score 0  During Hemodialysis Assessment  Blood Flow Rate (mL/min) 300 mL/min  Arterial Pressure (mmHg) -150 mmHg  Venous Pressure (mmHg) 120 mmHg  Transmembrane Pressure (mmHg) 10 mmHg  Ultrafiltration Rate (mL/min) 10000 mL/min  Dialysate Flow Rate (mL/min)  (SEQ)  Conductivity: Machine  14.2  HD Safety Checks Performed Yes  Dialysis Fluid Bolus Normal Saline  Bolus Amount (mL) 250 mL  Intra-Hemodialysis Comments Tx initiated

## 2019-04-14 NOTE — Progress Notes (Signed)
Post HD Tx Assessment   04/14/19 1250  Neurological  Level of Consciousness Alert  Orientation Level Oriented X4  Respiratory  Respiratory Pattern Regular;Unlabored  Chest Assessment Chest expansion symmetrical  Bilateral Breath Sounds Clear  R Upper  Breath Sounds Clear  L Upper Breath Sounds Clear  R Lower Breath Sounds Clear;Diminished  L Lower Breath Sounds Clear;Diminished  Cough None  Cardiac  Pulse Regular  Heart Sounds S1, S2  Jugular Venous Distention (JVD) No  ECG Monitor Yes  Cardiac Rhythm NSR  Antiarrhythmic device No  Vascular  R Radial Pulse +2  L Radial Pulse +2  Edema Generalized  Generalized Edema +1  RLE Edema +2  LLE Edema +2  Integumentary  Integumentary (WDL) WDL  Musculoskeletal  Musculoskeletal (WDL) X  Generalized Weakness Yes  Gastrointestinal  Bowel Sounds Assessment Active  GU Assessment  Genitourinary (WDL) X  Genitourinary Symptoms External catheter  Psychosocial  Psychosocial (WDL) WDL

## 2019-04-14 NOTE — Progress Notes (Signed)
[facility]  ROUNDING NOTE   Subjective:   Furosemide gtt 5mg /hr Wt 107.45kg ( 112.5kg on admission) 11/15 0701 - 11/16 0700 In: 3 [I.V.:3] Out: 1450 [Urine:1450]  Length of stay: 4 days   Objective:  Vital signs in last 24 hours:  Temp:  [98.4 F (36.9 C)-98.8 F (37.1 C)] 98.4 F (36.9 C) (11/16 0531) Pulse Rate:  [64-73] 64 (11/16 0531) Resp:  [20] 20 (11/16 0531) BP: (118-150)/(64-78) 136/69 (11/16 0531) SpO2:  [94 %-100 %] 95 % (11/16 0531) Weight:  [107.4 kg] 107.4 kg (11/16 0500)  Weight change: -0.045 kg Filed Weights   04/10/19 1757 04/13/19 0429 04/14/19 0500  Weight: 112.5 kg 107.5 kg 107.4 kg    Intake/Output: I/O last 3 completed shifts: In: 3 [I.V.:3] Out: 1450 [Urine:1450]   Intake/Output this shift:  Total I/O In: 174 [I.V.:174] Out: 200 [Urine:200]  Physical Exam: General: NAD, sitting up on the side of the bed  Head: Normocephalic, atraumatic. Moist oral mucosal membranes  Eyes: Anicteric,   Neck: Supple,   Lungs:  Bilateral mild basilar crackles, diminished at bases  Heart: Regular rate and rhythm  Abdomen:  abdominal wall edema   Extremities: + peripheral edema upto thighs  Neurologic:  Alert and oriented  Skin: No lesions  Access: Right femoral temp catheter 11/14 Dr. Lucky Cowboy    Basic Metabolic Panel: Recent Labs  Lab 04/10/19 2053 04/11/19 0354 04/12/19 0833 04/13/19 0538 04/14/19 0557  NA 145 145 142  143 140 141  K 3.4* 3.7 3.5  3.5 3.4* 3.2*  CL 105 109 106  107 104 104  CO2 28 28 27  27 27 28   GLUCOSE 203* 206* 131*  132* 190* 155*  BUN 22 21 18  17 16 19   CREATININE 1.45* 1.38* 1.39*  1.42* 1.56* 1.51*  CALCIUM 8.6* 8.3* 8.2*  8.3* 8.1* 8.2*  MG  --  1.8  --   --  1.8  PHOS  --   --  4.0  --   --     Liver Function Tests: Recent Labs  Lab 04/12/19 0833  ALBUMIN 2.6*   No results for input(s): LIPASE, AMYLASE in the last 168 hours. No results for input(s): AMMONIA in the last 168  hours.  CBC: Recent Labs  Lab 04/10/19 2053 04/11/19 0354 04/12/19 0833 04/14/19 0557  WBC 5.6 5.1 4.7 4.7  NEUTROABS  --   --  3.2  --   HGB 11.6* 10.0* 10.2* 10.2*  HCT 37.9 34.2* 33.9* 33.6*  MCV 82.0 85.3 82.3 83.2  PLT 148* 153 142* 132*    Cardiac Enzymes: No results for input(s): CKTOTAL, CKMB, CKMBINDEX, TROPONINI in the last 168 hours.  BNP: Invalid input(s): POCBNP  CBG: Recent Labs  Lab 04/13/19 0746 04/13/19 1122 04/13/19 1650 04/13/19 2111 04/14/19 0731  GLUCAP 173* 191* 281* 164* 160*    Microbiology: Results for orders placed or performed during the hospital encounter of 04/10/19  SARS CORONAVIRUS 2 (TAT 6-24 HRS) Nasopharyngeal Nasopharyngeal Swab     Status: None   Collection Time: 04/10/19  9:14 PM   Specimen: Nasopharyngeal Swab  Result Value Ref Range Status   SARS Coronavirus 2 NEGATIVE NEGATIVE Final    Comment: (NOTE) SARS-CoV-2 target nucleic acids are NOT DETECTED. The SARS-CoV-2 RNA is generally detectable in upper and lower respiratory specimens during the acute phase of infection. Negative results do not preclude SARS-CoV-2 infection, do not rule out co-infections with other pathogens, and should not be used as the  sole basis for treatment or other patient management decisions. Negative results must be combined with clinical observations, patient history, and epidemiological information. The expected result is Negative. Fact Sheet for Patients: SugarRoll.be Fact Sheet for Healthcare Providers: https://www.woods-mathews.com/ This test is not yet approved or cleared by the [place] FDA and  has been authorized for detection and/or diagnosis of SARS-CoV-2 by FDA under an Emergency Use Authorization (EUA). This EUA will remain  in effect (meaning this test can be used) for the duration of the COVID-19 declaration under Section 56 4(b)(1) of the Act, 21 U.S.C. section 360bbb-3(b)(1),  unless the authorization is terminated or revoked sooner. Performed at [facility], [place] [address], [place], [place] [address]     Coagulation Studies: No results for input(s): LABPROT, INR in the last 72 hours.  Urinalysis: No results for input(s): COLORURINE, LABSPEC, PHURINE, GLUCOSEU, HGBUR, BILIRUBINUR, KETONESUR, PROTEINUR, UROBILINOGEN, NITRITE, LEUKOCYTESUR in the last 72 hours.  Invalid input(s): APPERANCEUR    Imaging: No results found.   Medications:   . furosemide (LASIX) infusion 5 mg/hr (04/14/19 0822)   . aspirin EC  81 mg Oral QPM  . atorvastatin  40 mg Oral QPM  . Chlorhexidine Gluconate Cloth  6 each Topical Q0600  . cloNIDine  0.3 mg Oral TID  . hydrALAZINE  100 mg Oral TID  . insulin aspart  0-5 Units Subcutaneous QHS  . insulin aspart  0-9 Units Subcutaneous TID WC  . insulin aspart protamine- aspart  14 Units Subcutaneous BID WC  . metoprolol  200 mg Oral Daily  . potassium chloride  40 mEq Oral Once  . sodium chloride flush  3 mL Intravenous Q12H   acetaminophen **OR** acetaminophen, ALPRAZolam, ondansetron **OR** ondansetron (ZOFRAN) IV  Assessment/ Plan:  Ms. SALVADOR BIGBEE is a 68 y.o. black female with hypertension, diabetes mellitus type II, hyperlipidemia, depression, history of colon cancer, coronary artery disease, congestive heart failure with diastolic disfunction, right rotator cuff trauma, hyperaldosteronism who is admitted from nephrology clinic on 04/10/2019 for Volume overload AKI CKD level 3  1. 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  Chronic kidney disease secondary to diabetic nephropathy Acute renal failure due to acute cardiorenal syndrome.   2. LE Edema and Volume overload. Failed outpatient diuretic therapy. Last 2 D Echo(03/11/2019) = LVEF 50-55 %; LVH; Severe Pulmonary Hypertension Now getting iv furosemide and UF only dialysis for  volume removal Consider Pulmonary consult for e/m of Pulm HTN Daily standing weight  3. Hypertension: with chronic kidney disease: home regimen of olmesartan, clonidine, hydralazine, metoprolol and torsemide. With volume removal, blood pressure is expected to get better We will decrease the dose of hydralazine to 25 mg 3 times a day May need to reduce dose of clonidine in next few days.  4. Diabetes mellitus type II with chronic kidney disease: insulin dependent. Hemoglobin A1c of 7.2% on 03/11/2019 - holding metformin  5.  Hypokalemia -Potassium supplementation -Start spironolactone 25 mg daily  6.  Hypoalbuminemia -We will supplement low-dose albumin for oncotic support to assist with volume removal    LOS: 4 Daelin Haste 11/16/20209:23 AM

## 2019-04-14 NOTE — Progress Notes (Signed)
Post HD Tx Note    04/14/19 1300  Hand-Off documentation  Report given to (Full Name) Lewie Chamber RN   Report received from (Full Name) Newt Minion RN   Vital Signs  Temp 98.7 F (37.1 C)  Temp Source Oral  Pulse Rate 65  Pulse Rate Source Monitor  Resp 20  BP (!) 163/71  BP Location Right Arm  BP Method Automatic  Patient Position (if appropriate) Lying  Oxygen Therapy  SpO2 100 %  O2 Device Room Air  Pain Assessment  Pain Scale 0-10  Pain Score 0  Post-Hemodialysis Assessment  Rinseback Volume (mL) 250 mL  KECN 0 V (SEQ)  Dialyzer Clearance Lightly streaked  Duration of HD Treatment -hour(s) 3 hour(s)  Hemodialysis Intake (mL) 500 mL  UF Total -Machine (mL) 3000 mL  Net UF (mL) 2500 mL  Tolerated HD Treatment Yes  Hemodialysis Catheter Superior Double lumen Permanent (Tunneled)  No Placement Date or Time found.   Placed prior to admission: Yes  Orientation: Superior  Hemodialysis Catheter Type: Double lumen Permanent (Tunneled)  Site Condition No complications  Blue Lumen Status Heparin locked  Red Lumen Status Heparin locked  Purple Lumen Status Capped (Central line)  Catheter fill solution Heparin 1000 units/ml  Catheter fill volume (Arterial) 1.8 cc  Catheter fill volume (Venous) 1.8  Dressing Type Biopatch  Drainage Description None  Dressing Change Due 04/16/19  Post treatment catheter status Capped and Clamped

## 2019-04-14 NOTE — Consult Note (Signed)
Reason for Consult: Pulmonary hypertension Referring Physician: Murlean Iba, MD  Priscilla Villanueva is an 68 y.o. female.  HPI: This is a 68 year old former smoker with a history as noted below recently noted to have severe pulmonary hypertension who presented on 10 April 2019 with severe volume overload.  She was noted to be approximately 40 pounds over her baseline dry weight.  She had severe lower extremity edema.  She had had a right heart catheterization on 02 April 2019 by Dr. Lujean Amel.  She was noted to have a pulmonary artery pressure of 74/37 with a mean of 51.  Pulmonary capillary wedge pressure was noted to be 12.  Systemic vascular resistance is calculated at 1852 dynes per second and pulmonary vascular resistance was noted to be 8.8 Wood units.  Findings are consistent with severe precapillary pulmonary hypertension shortly seen in either primary pulmonary arterial hypertension, chronic hypoxic vasoconstriction, Neri artery obstruction or other unclear etiologies.  Patient notes that since admission she has felt better as she has been aggressively diuresed and has actually required dialysis to manage her severe volume overload.  We are asked to render opinion with regards to her pulmonary hypertension.  The patient did not seem to have a pulmonary vasodilator challenge.  Patient does not endorse any fevers, chills or sweats.  No cough or sputum production.  No hemoptysis.  She has had occasional chest pressure.  No presyncope or syncope.  Has noted dyspnea.  She recently had a sleep study on 13 November however the results are not on the chart.  It was noted however during the study that she had to be placed on 1.5 L of oxygen via nasal cannula due to oxygen desaturations.  He is far from the technician notes.  She also appeared to have an episode of reflux during this test.  It was significant to cause on awakening event.  Past Medical History:  Diagnosis Date  . CAD (coronary  artery disease)   . CHF (congestive heart failure) ([place])   . Colon cancer ([place]) 2009   with chemotherapy  . Colon cancer ([place]) 11/26/2014  . Depression   . Diabetes mellitus without complication ([place])   . Hypercholesteremia   . Hypertension   . Obesity   . Personal history of chemotherapy 2009   colon ca    Past Surgical History:  Procedure Laterality Date  . ABDOMINAL HYSTERECTOMY     PARTIAL  . COLECTOMY    . COLONOSCOPY WITH PROPOFOL N/A 07/16/2017   Procedure: COLONOSCOPY WITH PROPOFOL;  Surgeon: Lollie Sails, MD;  Location: [facility] ENDOSCOPY;  Service: Endoscopy;  Laterality: N/A;  . PERIPHERAL VASCULAR CATHETERIZATION N/A 01/26/2015   Procedure: Glori Luis Cath Removal;  Surgeon: Katha Cabal, MD;  Location: [facility] CV LAB;  Service: Cardiovascular;  Laterality: N/A;  . RIGHT HEART CATH AND CORONARY ANGIOGRAPHY Right 04/02/2019   Procedure: RIGHT HEART CATH AND CORONARY ANGIOGRAPHY;  Surgeon: Yolonda Kida, MD;  Location: [facility] LAB;  Service: Cardiovascular;  Laterality: Right;  . TEMPORARY DIALYSIS CATHETER N/A 04/11/2019   Procedure: TEMPORARY DIALYSIS CATHETER;  Surgeon: Algernon Huxley, MD;  Location: [facility] LAB;  Service: Cardiovascular;  Laterality: N/A;  . UPPER GI ENDOSCOPY      Family History  Problem Relation Age of Onset  . Colon cancer Mother        "had some type of tumor in the vulva liver unknown primary"  . Colon cancer Brother        "  died at young age"  . Breast cancer Neg Hx     Social History:  reports that she has quit smoking. She has a 3.00 pack-year smoking history. She has never used smokeless tobacco. She reports current alcohol use of about 1.0 standard drinks of alcohol per week. She reports that she does not use drugs.  Allergies:  Allergies  Allergen Reactions  . Amlodipine Swelling    Causes pt's feet and legs to swell...  . Hydrochlorothiazide Other (See Comments)    Headaches.  . Lisinopril Swelling and  Cough  . Sitagliptin Diarrhea  . Zoloft [Sertraline] Other (See Comments)    Unsure of exact reaction    Medications:  I have reviewed the patient's current medications. Prior to Admission:  Medications Prior to Admission  Medication Sig Dispense Refill Last Dose  . allopurinol (ZYLOPRIM) 300 MG tablet Take 300 mg by mouth daily.    Past Week at Unknown time  . ALPRAZolam (XANAX) 0.25 MG tablet Take 0.25 mg by mouth at bedtime as needed for sleep.    Past Week at prn  . aspirin 81 MG EC tablet Take 81 mg by mouth every evening. Swallow whole.    Past Week at Unknown time  . atorvastatin (LIPITOR) 40 MG tablet Take 40 mg by mouth every evening.    Past Week at Unknown time  . b complex vitamins capsule Take 1 capsule by mouth daily.   Past Week at Unknown time  . cloNIDine (CATAPRES) 0.3 MG tablet Take 0.3 mg by mouth 3 (three) times daily.    Past Week at Unknown time  . Coenzyme Q10 (COQ10) 100 MG CAPS Take 100 mg by mouth every evening.   Past Week at Unknown time  . colchicine 0.6 MG tablet Take 1.2 mg by mouth daily as needed (gout flare ups).    prn at prn  . HUMALOG MIX 75/25 KWIKPEN (75-25) 100 UNIT/ML Kwikpen Inject 28 Units into the skin 2 (two) times daily.    Past Week at Unknown time  . hydrALAZINE (APRESOLINE) 100 MG tablet Take 100 mg by mouth 3 (three) times daily.   Past Week at Unknown time  . liraglutide (VICTOZA) 18 MG/3ML SOPN Inject 0.6 mg into the skin daily.    Past Week at Unknown time  . metoprolol (TOPROL-XL) 200 MG 24 hr tablet Take 200 mg by mouth daily.   Past Week at Unknown time  . olmesartan (BENICAR) 40 MG tablet Take 0.5 tablets (20 mg total) by mouth daily. (Patient taking differently: Take 40 mg by mouth daily. ) 15 tablet 0 Past Week at Unknown time  . olopatadine (PATANOL) 0.1 % ophthalmic solution Place 1 drop into both eyes 2 (two) times daily as needed for allergies.   prn at prn  . polyethylene glycol (MIRALAX / GLYCOLAX) packet Take 17 g by mouth  daily as needed (constipation.).    prn at prn  . potassium chloride (K-DUR) 10 MEQ tablet TAKE ONE TABLET BY MOUTH TWICE DAILY (Patient taking differently: Take 10 mEq by mouth 2 (two) times daily. ) 60 tablet 0 Past Week at Unknown time  . torsemide (DEMADEX) 20 MG tablet Take 20 mg by mouth daily.   Past Week at Unknown time    Results for orders placed or performed during the hospital encounter of 04/10/19 (from the past 48 hour(s))  Glucose, capillary     Status: Abnormal   Collection Time: 04/12/19  8:56 PM  Result Value Ref Range  Glucose-Capillary 197 (H) 70 - 99 mg/dL  Basic metabolic panel     Status: Abnormal   Collection Time: 04/13/19  5:38 AM  Result Value Ref Range   Sodium 140 135 - 145 mmol/L   Potassium 3.4 (L) 3.5 - 5.1 mmol/L   Chloride 104 98 - 111 mmol/L   CO2 27 22 - 32 mmol/L   Glucose, Bld 190 (H) 70 - 99 mg/dL   BUN 16 8 - 23 mg/dL   Creatinine, Ser 1.56 (H) 0.44 - 1.00 mg/dL   Calcium 8.1 (L) 8.9 - 10.3 mg/dL   GFR calc non Af Amer 34 (L) >60 mL/min   GFR calc Af Amer 40 (L) >60 mL/min   Anion gap 9 5 - 15    Comment: Performed at [facility], [address] [place], [place] [address]  Glucose, capillary     Status: Abnormal   Collection Time: 04/13/19  7:46 AM  Result Value Ref Range   Glucose-Capillary 173 (H) 70 - 99 mg/dL  Glucose, capillary     Status: Abnormal   Collection Time: 04/13/19 11:22 AM  Result Value Ref Range   Glucose-Capillary 191 (H) 70 - 99 mg/dL  Glucose, capillary     Status: Abnormal   Collection Time: 04/13/19  4:50 PM  Result Value Ref Range   Glucose-Capillary 281 (H) 70 - 99 mg/dL  Glucose, capillary     Status: Abnormal   Collection Time: 04/13/19  9:11 PM  Result Value Ref Range   Glucose-Capillary 164 (H) 70 - 99 mg/dL   Comment 1 Notify RN   Basic metabolic panel     Status: Abnormal   Collection Time: 04/14/19  5:57 AM  Result Value Ref Range   Sodium 141 135 - 145 mmol/L   Potassium 3.2 (L) 3.5 -  5.1 mmol/L   Chloride 104 98 - 111 mmol/L   CO2 28 22 - 32 mmol/L   Glucose, Bld 155 (H) 70 - 99 mg/dL   BUN 19 8 - 23 mg/dL   Creatinine, Ser 1.51 (H) 0.44 - 1.00 mg/dL   Calcium 8.2 (L) 8.9 - 10.3 mg/dL   GFR calc non Af Amer 36 (L) >60 mL/min   GFR calc Af Amer 41 (L) >60 mL/min   Anion gap 9 5 - 15    Comment: Performed at [facility], [address] [place], [place] [address]  Magnesium     Status: None   Collection Time: 04/14/19  5:57 AM  Result Value Ref Range   Magnesium 1.8 1.7 - 2.4 mg/dL    Comment: Performed at [facility], [address] [place], [place] [address]  CBC     Status: Abnormal   Collection Time: 04/14/19  5:57 AM  Result Value Ref Range   WBC 4.7 4.0 - 10.5 K/uL   RBC 4.04 3.87 - 5.11 MIL/uL   Hemoglobin 10.2 (L) 12.0 - 15.0 g/dL   HCT 33.6 (L) 36.0 - 46.0 %   MCV 83.2 80.0 - 100.0 fL   MCH 25.2 (L) 26.0 - 34.0 pg   MCHC 30.4 30.0 - 36.0 g/dL   RDW 18.9 (H) 11.5 - 15.5 %   Platelets 132 (L) 150 - 400 K/uL   nRBC 0.0 0.0 - 0.2 %    Comment: Performed at [facility], [address] [place], [place] [address]  Glucose, capillary     Status: Abnormal   Collection Time: 04/14/19  7:31 AM  Result  Value Ref Range   Glucose-Capillary 160 (H) 70 - 99 mg/dL   Comment 1 Notify RN   Sedimentation rate     Status: None   Collection Time: 04/14/19  4:14 PM  Result Value Ref Range   Sed Rate 28 0 - 30 mm/hr    Comment: Performed at [facility], [address] [place], [place] [address]  Blood gas, arterial     Status: Abnormal   Collection Time: 04/14/19  4:48 PM  Result Value Ref Range   FIO2 0.21    pH, Arterial 7.50 (H) 7.350 - 7.450   pCO2 arterial 35 32.0 - 48.0 mmHg   pO2, Arterial 56 (L) 83.0 - 108.0 mmHg   Bicarbonate 27.3 20.0 - 28.0 mmol/L   Acid-Base Excess 4.2 (H) 0.0 - 2.0 mmol/L   O2 Saturation 91.4 %   Patient temperature 37.0    Collection site RIGHT BRACHIAL    Sample type ARTERIAL DRAW     Allens test (pass/fail) PASS PASS    Comment: Performed at [facility], [address] [place], [place] [address]  Glucose, capillary     Status: Abnormal   Collection Time: 04/14/19  4:50 PM  Result Value Ref Range   Glucose-Capillary 167 (H) 70 - 99 mg/dL   Comment 1 Notify RN     No results found.  Review of Systems  Constitutional: Positive for malaise/fatigue.  HENT: Negative.   Eyes: Negative.   Respiratory: Positive for shortness of breath.   Cardiovascular: Positive for palpitations, leg swelling and PND.  Gastrointestinal: Positive for heartburn.  Genitourinary: Negative.   Musculoskeletal: Positive for joint pain (Knees bilaterally).  Skin: Negative.   Neurological: Negative.   Endo/Heme/Allergies: Negative.   Psychiatric/Behavioral: Negative.   All other systems reviewed and are negative.  Blood pressure (!) 163/71, pulse 65, temperature (!) 97.2 F (36.2 C), temperature source Oral, resp. rate (!) 23, height 5\' 4"  (1.626 m), weight 107.4 kg, SpO2 96 %. Physical Exam  Constitutional: She is oriented to person, place, and time. She appears well-developed. She is cooperative.  Obese, in no respiratory distress, not on oxygen.  HENT:  Head: Normocephalic and atraumatic.  Right Ear: External ear normal.  Left Ear: External ear normal.  Nose: Nose normal.  Mouth/Throat: Oropharynx is clear and moist and mucous membranes are normal.  Neck: Trachea normal. Neck supple. JVD present. Carotid bruit is not present. No tracheal deviation present. No thyromegaly present.  Cardiovascular: Normal rate and regular rhythm. Exam reveals no gallop.  No murmur heard. Accentuated P2  Respiratory: Effort normal. No respiratory distress. She has no wheezes. She has no rales.  GI: Soft. She exhibits no ascites. Distention: Protuberant.     Musculoskeletal:        General: Edema present.     Comments: 3+ lower extremity edema, pitting  Neurological: She is alert and  oriented to person, place, and time.  No overt focal deficits.  Skin: Skin is warm and dry. No erythema.  Psychiatric: She has a normal mood and affect. Her behavior is normal.    Assessment/Plan:  1.  Severe pulmonary hypertension: This is classified as "precapillary" pulmonary hypertension.  Her hemodynamic numbers by right heart cath are consistent with either Group 1 (pulmonary artery hypertension, idiopathic), group 3 (lung disease or hypoxic vasoconstriction), group 4 (pulmonary artery obstruction (and/or group 5 this is pulmonary hypertension of unclear/multifactorial etiologies).  It appears that the patient does have some issues with hypoxia particularly at nighttime,  formal sleep study is pending.  Arterial blood gas does show mild to moderate hypoxemia with PaO2 of 56.  PaCO2 was normal so obesity with obesity hypoventilation is not considered in this situation.  Ideally ,she should have pulmonary function testing to further evaluate pulmonary causes for ongoing hypoxemia.  This can be done as an outpatient.  I recommend for now supplemental oxygen at 2 L/min, 24/7.  She should have ambulatory oximetry performed on room air prior to discharge.  Pulmonary artery obstruction has been ruled out by low probability VQ scan, this is the preferred study for chronic pulmonary embolism.  A vasoreactivity test was not performed during the right heart cath but she may benefit from pulmonary vasodilators.  However, given the severity of her pulmonary hypertension I endorse referral to the [facility] so that the appropriate pulmonary vasodilator can be instituted.  Additionally, once temporary catheter for dialysis has been removed recommend that the patient be placed on anticoagulation as patients with severe pulmonary hypertension are prone to pulmonary embolism.    I have taken the liberty to order test to rule out potential connective tissue disease.  I will gladly  continue to follow patient as an outpatient in our pulmonary clinic.   2.  Acute on chronic renal failure on CKDIII: Currently undergoing dialysis to assist with volume overload.  Baseline creatinine is 1.6, 1.51 today management per renal.  3.  Acute on chronic diastolic heart failure: This issue adds complexity to her management vis--vis her volume overload and her pulmonary hypertension.  4.  Hypertension: This issue adds complexity to her management given all of the above.  She is currently on multiple antihypertensives may benefit from addition of calcium channel blocker in particular diltiazem given her pulmonary hypertension.  Will need to coordinate with renal.  She would need to be monitored closely at initiation of therapy with a calcium channel blocker.  5.  Diabetes mellitus type 2: This issue also adds complexity to her management.  Regimen per primary team.  6.  Obesity with BMI of 40.3: This issue also adds complexity to her management.  There is a possibility of obstructive sleep apnea, formal results from the sleep study are not back yet.  7.  Gastroesophageal reflux: Per the sleep technician the patient was witnessed to reflux to the point of significant awakening from sleep and subsequent desaturation after that.  We will add Protonix before supper daily.  Antireflux measures.    Thank you for allowing me to participate in this patient's care.  Please see orders for details.    Renold Don, MD [facility] 04/14/2019, 5:56 PM    This note was dictated using voice recognition software/Dragon.  Despite best efforts to proofread, errors can occur which can change the meaning.  Any change was purely unintentional.

## 2019-04-14 NOTE — Consult Note (Signed)
PHARMACY CONSULT NOTE - FOLLOW UP  Pharmacy Consult for Electrolyte Monitoring and Replacement   Recent Labs: Potassium (mmol/L)  Date Value  04/14/2019 3.2 (L)  05/28/2013 3.5   Magnesium (mg/dL)  Date Value  04/11/2019 1.8  05/27/2013 1.9   Calcium (mg/dL)  Date Value  04/14/2019 8.2 (L)   Calcium, Total (mg/dL)  Date Value  05/28/2013 9.2   Albumin (g/dL)  Date Value  04/12/2019 2.6 (L)  06/23/2013 3.4   Phosphorus (mg/dL)  Date Value  04/12/2019 4.0   Sodium (mmol/L)  Date Value  04/14/2019 141  05/28/2013 134 (L)   Corrected Ca: 9.32 mg/dL Add-On magnesium: 1.8 mg/dL  Assessment: 68 year old female with a past medical history notable for CKD stage III, chronic diastolic congestive heart failure, moderate severe pulmonary hypertension, insulin-dependent diabetes, hypertension, hyperlipidemia and CAD as well as history of colon cancer. She is on a furosemide infusion running at 5 mg/hr   Goal of Therapy:  Potassium 4.0 - 5.1 mmol/L Magnesium 2.0 - 2.4 mg/dL Other Electrolytes WNL  Plan:   Replace magnesium with 2 grams IV magnesium sulfate  Spironolactone was restarted today which will increase potassium  Replace with an additional 40 mEq oral KCl  Re-check electrolytes in am  Dallie Piles ,PharmD Clinical Pharmacist 04/14/2019 8:05 AM

## 2019-04-14 NOTE — Plan of Care (Signed)

## 2019-04-14 NOTE — Progress Notes (Signed)
Pre HD Note    04/14/19 0940  Hand-Off documentation  Report given to (Full Name) Newt Minion RN   Report received from (Full Name) Lewie Chamber  Vital Signs  Temp (!) 97.2 F (36.2 C)  Temp Source Oral  Pulse Rate 66  Pulse Rate Source Monitor  Resp (!) 22  BP (!) 150/80  BP Location Right Arm  BP Method Automatic  Patient Position (if appropriate) Lying  Oxygen Therapy  SpO2 95 %  O2 Device Room Air  Pain Assessment  Pain Scale 0-10  Pain Score 0  Time-Out for Hemodialysis  What Procedure? HD  Pt Identifiers(min of two) First/Last Name;MRN/Account#  Correct Site? Yes  Correct Side? Yes  Correct Procedure? Yes  Consents Verified? Yes  Safety Precautions Reviewed? Yes  Engineer, civil (consulting) Number 2  Station Number 3  UF/Alarm Test Passed  Conductivity: Meter 13.4  Conductivity: Machine  13.9  pH 7.6  Reverse Osmosis Main  Normal Saline Lot Number [id number]  Dialyzer Lot Number [id number]  Disposable Set Lot Number [id number]  Machine Temperature 98.6 F (37 C)  Musician and Audible Yes  Blood Lines Intact and Secured Yes  Pre Treatment Patient Checks  Vascular access used during treatment Catheter  HD catheter dressing before treatment WDL  Patient is receiving dialysis in a chair  (In bed)  Hepatitis B Surface Antigen Results Negative  Date Hepatitis B Surface Antigen Drawn 04/11/19  Hepatitis B Surface Antibody  (<10)  Date Hepatitis B Surface Antibody Drawn 04/11/19  Hemodialysis Consent Verified Yes  Hemodialysis Standing Orders Initiated Yes  ECG (Telemetry) Monitor On Yes  Prime Ordered Normal Saline  Length of  DialysisTreatment -hour(s) 3 Hour(s)  Dialysis Treatment Comments  (Na140)  Dialyzer Elisio 17H NR  Dialysate 3K;2.5 Ca  Dialysis Anticoagulant None  Dialysate Flow Ordered 800  Blood Flow Rate Ordered 300 mL/min  Ultrafiltration Goal 2.5 Liters  Ultrafiltration Profile  (SEQ)  Pre Treatment Labs CBC  Dialysis Blood Pressure  Support Ordered Albumin  Education / Care Plan  Dialysis Education Provided Yes  Documented Education in Care Plan Yes  Hemodialysis Catheter Superior Double lumen Permanent (Tunneled)  No Placement Date or Time found.   Placed prior to admission: Yes  Orientation: Superior  Hemodialysis Catheter Type: Double lumen Permanent (Tunneled)  Site Condition No complications  Blue Lumen Status Flushed;Blood return noted  Red Lumen Status Flushed;Blood return noted  Purple Lumen Status Capped (Central line)  Catheter fill solution Heparin 1000 units/ml  Catheter fill volume (Arterial) 1.8 cc  Catheter fill volume (Venous) 1.8  Dressing Type Biopatch  Dressing Status Clean;Dry;Intact  Interventions New dressing  Drainage Description None

## 2019-04-15 LAB — GLUCOSE, CAPILLARY
Glucose-Capillary: 110 mg/dL — ABNORMAL HIGH (ref 70–99)
Glucose-Capillary: 134 mg/dL — ABNORMAL HIGH (ref 70–99)
Glucose-Capillary: 144 mg/dL — ABNORMAL HIGH (ref 70–99)

## 2019-04-15 LAB — CBC WITH DIFFERENTIAL/PLATELET
Abs Immature Granulocytes: 0.02 10*3/uL (ref 0.00–0.07)
Basophils Absolute: 0 10*3/uL (ref 0.0–0.1)
Basophils Relative: 0 %
Eosinophils Absolute: 0.1 10*3/uL (ref 0.0–0.5)
Eosinophils Relative: 3 %
HCT: 33.3 % — ABNORMAL LOW (ref 36.0–46.0)
Hemoglobin: 10.3 g/dL — ABNORMAL LOW (ref 12.0–15.0)
Immature Granulocytes: 0 %
Lymphocytes Relative: 23 %
Lymphs Abs: 1 10*3/uL (ref 0.7–4.0)
MCH: 25.1 pg — ABNORMAL LOW (ref 26.0–34.0)
MCHC: 30.9 g/dL (ref 30.0–36.0)
MCV: 81 fL (ref 80.0–100.0)
Monocytes Absolute: 0.4 10*3/uL (ref 0.1–1.0)
Monocytes Relative: 8 %
Neutro Abs: 3 10*3/uL (ref 1.7–7.7)
Neutrophils Relative %: 66 %
Platelets: 134 10*3/uL — ABNORMAL LOW (ref 150–400)
RBC: 4.11 MIL/uL (ref 3.87–5.11)
RDW: 19 % — ABNORMAL HIGH (ref 11.5–15.5)
WBC: 4.5 10*3/uL (ref 4.0–10.5)
nRBC: 0 % (ref 0.0–0.2)

## 2019-04-15 LAB — BASIC METABOLIC PANEL
Anion gap: 9 (ref 5–15)
BUN: 19 mg/dL (ref 8–23)
CO2: 25 mmol/L (ref 22–32)
Calcium: 8.2 mg/dL — ABNORMAL LOW (ref 8.9–10.3)
Chloride: 104 mmol/L (ref 98–111)
Creatinine, Ser: 1.49 mg/dL — ABNORMAL HIGH (ref 0.44–1.00)
GFR calc Af Amer: 42 mL/min — ABNORMAL LOW (ref >60)
GFR calc non Af Amer: 36 mL/min — ABNORMAL LOW (ref >60)
Glucose, Bld: 120 mg/dL — ABNORMAL HIGH (ref 70–99)
Potassium: 3.3 mmol/L — ABNORMAL LOW (ref 3.5–5.1)
Sodium: 138 mmol/L (ref 135–145)

## 2019-04-15 LAB — ANA W/REFLEX: Anti Nuclear Antibody (ANA): NEGATIVE

## 2019-04-15 LAB — RHEUMATOID FACTOR: Rheumatoid fact SerPl-aCnc: <10 IU/mL (ref 0.0–13.9)

## 2019-04-15 LAB — MAGNESIUM: Magnesium: 2.1 mg/dL (ref 1.7–2.4)

## 2019-04-15 LAB — ANGIOTENSIN CONVERTING ENZYME: Angiotensin-Converting Enzyme: 34 U/L (ref 14–82)

## 2019-04-15 MED ORDER — INSULIN ASPART PROT & ASPART (70-30 MIX) 100 UNIT/ML ~~LOC~~ SUSP
7.0000 [IU] | Freq: Once | SUBCUTANEOUS | Status: AC
  Administered 2019-04-15: 7 [IU] via SUBCUTANEOUS
  Filled 2019-04-15: qty 10

## 2019-04-15 MED ORDER — PANTOPRAZOLE SODIUM 40 MG PO TBEC
40.0000 mg | DELAYED_RELEASE_TABLET | Freq: Every day | ORAL | Status: DC
  Administered 2019-04-16 – 2019-04-17 (×2): 40 mg via ORAL
  Filled 2019-04-15 (×2): qty 1

## 2019-04-15 MED ORDER — POTASSIUM CHLORIDE CRYS ER 20 MEQ PO TBCR
40.0000 meq | EXTENDED_RELEASE_TABLET | Freq: Two times a day (BID) | ORAL | Status: AC
  Administered 2019-04-15 (×2): 40 meq via ORAL
  Filled 2019-04-15 (×2): qty 2

## 2019-04-15 NOTE — Progress Notes (Signed)
PROGRESS NOTE    Priscilla Villanueva  ZOX:[id number] DOB: September 20, 1950 DOA: 04/10/2019 PCP: Tracie Harrier, MD   Brief Narrative:  68 year old African-American female with a past medical history notable for CKD stage III, chronic diastolic congestive heart failure, moderate severe pulmonary hypertension, insulin-dependent diabetes, hypertension, hyperlipidemia and CAD as well as history of colon cancer who was admitted directly by nephrology for management of progressive volume overload with possibility of dialysis   Assessment & Plan:   Principal Problem:   Volume overload Active Problems:   CHF (congestive heart failure) ([place])   Diabetes mellitus, type 2 ([place])   Hypertension associated with diabetes ([place])   Hyperlipidemia associated with type 2 diabetes mellitus ([place])   CKD (chronic kidney disease), stage III   Hypervolemiawith chronic diastolic heart failure moderate severe pulmonary hypertension: -c/w lasix gtt 5mg /hr  -HD to assist with volume removal-day off today, resume today -Continue to monitor strict I's and O's and daily weights -Dialysis catheter placement by vascular surgery11/13 -Nephrology and vascular surgery involved  CKD stage III: -Dialysis catheter as above, -Monitor electrolytes, cautiously replete -dialysison Monday to contvolume removal -Net10.6 L outputwith lasix gtt -Oozing at the dialysis catheter site, was seen by vascular surgery and follow-up thought to be secondary to heparin no intervention necessary, anticipate resolution with cessation of dialysis and heparin and discontinuation of catheter  Insulin-dependent diabetes type 2: -Continue reduced dose of Humalog -Sliding scale insulin light  Hypertension: -Continue patient's clonidine, hydralazine, Toprol -Holding ARB  Hyperlipidemia: -Continue statin  DVT prophylaxis: SCD/Compression stockings  Code Status: full    Code Status Orders  (From admission, onward)         Start      Ordered   04/10/19 1959  Full code  Continuous     04/10/19 1959        Code Status History    Date Active Date Inactive Code Status Order ID Comments User Context   04/02/2019 0942 04/02/2019 1359 Full Code [id number]  Yolonda Kida, MD Inpatient   03/11/2019 0540 03/13/2019 1646 Full Code [id number]  Sidney Ace Arvella Merles, MD ED   01/26/2015 1435 01/26/2015 1800 Full Code [id number]  Schnier, Dolores Lory, MD Inpatient   Advance Care Planning Activity    Advance Directive Documentation     Most Recent Value  Type of Advance Directive  Living will, Healthcare Power of Attorney  Pre-existing out of facility DNR order (yellow form or pink MOST form)  -  "MOST" Form in Place?  -     Family Communication: None today, secure patient Disposition Plan:   Patient remained inpatient for continued dialysis with ultrafiltration for volume removal. Patient still with significant volume overload not yet ready for medical discharge Consults called: None Admission status: Inpatient   Consultants:   Nephrology, vascular surgery  Procedures:  Dg Chest 2 View  Result Date: 04/11/2019 CLINICAL DATA:  Pulmonary edema EXAM: CHEST - 2 VIEW COMPARISON:  03/11/2019 FINDINGS: Cardiomegaly. Mild, diffuse bilateral interstitial pulmonary opacity and trace pleural effusions. The visualized skeletal structures are unremarkable. IMPRESSION: 1. Mild, diffuse bilateral interstitial pulmonary opacity and trace pleural effusions, findings consistent with edema. No focal airspace opacity. 2.  Cardiomegaly. Electronically Signed   By: Eddie Candle M.D.   On: 04/11/2019 12:51   Mr Shoulder Right Wo Contrast  Result Date: 03/17/2019 CLINICAL DATA:  [place] 1 month ago. Persistent shoulder pain and painful range of motion. EXAM: MRI OF THE RIGHT SHOULDER WITHOUT CONTRAST TECHNIQUE: Multiplanar, multisequence MR imaging of the  shoulder was performed. No intravenous contrast was administered. COMPARISON:  Shoulder radiographs  02/21/2019 FINDINGS: Rotator cuff: There is a full-thickness retracted tear involving the anterior aspect of the supraspinatus tendon. Maximum retraction is estimated at 15 mm and the tear is approximately 15 mm wide. The subscapularis tendon is intact. Mild to moderate tendinopathy with small interstitial tears. The subscapularis tendon is intact. Muscles: Mild diffuse fatty atrophy of the shoulder musculature. There is also a benign-appearing lipoma in the chest wall near the teres major muscle. Biceps long head:  Intact Acromioclavicular Joint: Mild degenerative changes. Type 2 acromion. No significant lateral downsloping or undersurface spurring. Glenohumeral Joint: Moderate degenerative changes with joint space narrowing, degenerative chondrosis, early spurring and small joint effusion. Labrum:  No definite labral tears. Bones:  No acute bony findings. Other: Expected fluid in the subacromial/subdeltoid bursa. IMPRESSION: 1. Full-thickness retracted supraspinatus tendon tear measuring approximately 15 x 15 mm. 2. Infraspinatus and subscapularis tendinopathy but no tears. 3. Intact long head biceps tendon and glenoid labrum. 4. No significant MR findings to suggest bony impingement. 5. Moderate glenohumeral joint degenerative changes. Electronically Signed   By: Marijo Sanes M.D.   On: 03/17/2019 13:42     Antimicrobials:  none  Subjective: Patient reports oozing of dialysis catheter site overnight Otherwise no acute events Tolerated ultrafiltration without complications post procedure other than as above  Objective: Vitals:   04/14/19 1300 04/14/19 2129 04/15/19 0438 04/15/19 1215  BP: (!) 163/71 (!) 162/74 (!) 150/70 (!) 175/79  Pulse: 65 69 66 72  Resp: 20 20 20 20   Temp: 98.7 F (37.1 C) 98.5 F (36.9 C) 98.4 F (36.9 C) 98.8 F (37.1 C)  TempSrc: Oral Oral Oral Oral  SpO2: 100% 92% 94% 96%  Weight:   106.6 kg   Height:        Intake/Output Summary (Last 24 hours) at 04/15/2019  1408 Last data filed at 04/15/2019 0610 Gross per 24 hour  Intake 66.68 ml  Output 1000 ml  Net -933.32 ml   Filed Weights   04/13/19 0429 04/14/19 0500 04/15/19 0438  Weight: 107.5 kg 107.4 kg 106.6 kg    Examination:  General exam:Appears calm and comfortable  Respiratory system: Clear to auscultation. Respiratory effort normal. Cardiovascular system:S1 &S2 heard, RRR. No JVD, murmurs, rubs, gallops or clicks. No pedal edema. Gastrointestinal system:Abdomen is nondistended, soft and nontender. No organomegaly or masses felt. Normal bowel sounds heard. Central nervous system:Alert and oriented. No focal neurological deficits. Extremities:Significant anasarca continues to improve from prior, warm well perfused, bloodsoaked bandage at catheter site Skin: As above, but no new rashes, lesions or ulcers Psychiatry:Judgement and insight appear normal. Mood &affect appropriate..       Data Reviewed: I have personally reviewed following labs and imaging studies  CBC: Recent Labs  Lab 04/10/19 2053 04/11/19 0354 04/12/19 0833 04/14/19 0557 04/15/19 0542  WBC 5.6 5.1 4.7 4.7 4.5  NEUTROABS  --   --  3.2  --  3.0  HGB 11.6* 10.0* 10.2* 10.2* 10.3*  HCT 37.9 34.2* 33.9* 33.6* 33.3*  MCV 82.0 85.3 82.3 83.2 81.0  PLT 148* 153 142* 132* 734*   Basic Metabolic Panel: Recent Labs  Lab 04/11/19 0354 04/12/19 0833 04/13/19 0538 04/14/19 0557 04/15/19 0542  NA 145 142  143 140 141 138  K 3.7 3.5  3.5 3.4* 3.2* 3.3*  CL 109 106  107 104 104 104  CO2 28 27  27 27 28 25   GLUCOSE 206* 131*  132*  190* 155* 120*  BUN 21 18  17 16 19 19   CREATININE 1.38* 1.39*  1.42* 1.56* 1.51* 1.49*  CALCIUM 8.3* 8.2*  8.3* 8.1* 8.2* 8.2*  MG 1.8  --   --  1.8 2.1  PHOS  --  4.0  --   --   --    GFR: Estimated Creatinine Clearance: 44.3 mL/min (A) (by C-G formula based on SCr of 1.49 mg/dL (H)). Liver Function Tests: Recent Labs  Lab 04/12/19 0833  ALBUMIN 2.6*   No  results for input(s): LIPASE, AMYLASE in the last 168 hours. No results for input(s): AMMONIA in the last 168 hours. Coagulation Profile: No results for input(s): INR, PROTIME in the last 168 hours. Cardiac Enzymes: No results for input(s): CKTOTAL, CKMB, CKMBINDEX, TROPONINI in the last 168 hours. BNP (last 3 results) No results for input(s): PROBNP in the last 8760 hours. HbA1C: No results for input(s): HGBA1C in the last 72 hours. CBG: Recent Labs  Lab 04/14/19 0731 04/14/19 1650 04/14/19 2135 04/15/19 0727 04/15/19 1142  GLUCAP 160* 167* 119* 110* 134*   Lipid Profile: No results for input(s): CHOL, HDL, LDLCALC, TRIG, CHOLHDL, LDLDIRECT in the last 72 hours. Thyroid Function Tests: No results for input(s): TSH, T4TOTAL, FREET4, T3FREE, THYROIDAB in the last 72 hours. Anemia Panel: No results for input(s): VITAMINB12, FOLATE, FERRITIN, TIBC, IRON, RETICCTPCT in the last 72 hours. Sepsis Labs: No results for input(s): PROCALCITON, LATICACIDVEN in the last 168 hours.  Recent Results (from the past 240 hour(s))  SARS CORONAVIRUS 2 (TAT 6-24 HRS) Nasopharyngeal Nasopharyngeal Swab     Status: None   Collection Time: 04/10/19  9:14 PM   Specimen: Nasopharyngeal Swab  Result Value Ref Range Status   SARS Coronavirus 2 NEGATIVE NEGATIVE Final    Comment: (NOTE) SARS-CoV-2 target nucleic acids are NOT DETECTED. The SARS-CoV-2 RNA is generally detectable in upper and lower respiratory specimens during the acute phase of infection. Negative results do not preclude SARS-CoV-2 infection, do not rule out co-infections with other pathogens, and should not be used as the sole basis for treatment or other patient management decisions. Negative results must be combined with clinical observations, patient history, and epidemiological information. The expected result is Negative. Fact Sheet for Patients: SugarRoll.be Fact Sheet for Healthcare Providers:  https://www.woods-mathews.com/ This test is not yet approved or cleared by the [place] FDA and  has been authorized for detection and/or diagnosis of SARS-CoV-2 by FDA under an Emergency Use Authorization (EUA). This EUA will remain  in effect (meaning this test can be used) for the duration of the COVID-19 declaration under Section 56 4(b)(1) of the Act, 21 U.S.C. section 360bbb-3(b)(1), unless the authorization is terminated or revoked sooner. Performed at [facility], [place] [address] [place], [place] [address]          Radiology Studies: No results found.      Scheduled Meds: . aspirin EC  81 mg Oral QPM  . atorvastatin  40 mg Oral QPM  . Chlorhexidine Gluconate Cloth  6 each Topical Q0600  . cloNIDine  0.3 mg Oral TID  . hydrALAZINE  25 mg Oral TID  . insulin aspart  0-5 Units Subcutaneous QHS  . insulin aspart  0-9 Units Subcutaneous TID WC  . insulin aspart protamine- aspart  14 Units Subcutaneous BID WC  . metoprolol  200 mg Oral Daily  . potassium chloride  40 mEq Oral BID  . sodium chloride flush  3 mL Intravenous Q12H  .  spironolactone  25 mg Oral Daily   Continuous Infusions: . albumin human Stopped (04/14/19 1645)  . furosemide (LASIX) infusion 5 mg/hr (04/15/19 1239)     LOS: 5 days    Time spent: 7 min    Nicolette Bang, MD [facility]  If 7PM-7AM, please contact night-coverage  04/15/2019, 2:08 PM

## 2019-04-15 NOTE — Progress Notes (Signed)
HD TX completed    04/15/19 2045  Vital Signs  Temp 98.8 F (37.1 C)  Temp Source Oral  Pulse Rate 73  Pulse Rate Source Monitor  Resp 19  BP (!) 178/151  BP Location Right Arm  BP Method Automatic  Patient Position (if appropriate) Lying  Oxygen Therapy  SpO2 100 %  During Hemodialysis Assessment  HD Safety Checks Performed Yes  KECN 0 KECN  Dialysis Fluid Bolus Normal Saline  Bolus Amount (mL) 250 mL  Intra-Hemodialysis Comments Tx completed;Tolerated well

## 2019-04-15 NOTE — Consult Note (Signed)
PHARMACY CONSULT NOTE  Pharmacy Consult for Electrolyte Monitoring and Replacement   Recent Labs: Potassium (mmol/L)  Date Value  04/15/2019 3.3 (L)  05/28/2013 3.5   Magnesium (mg/dL)  Date Value  04/15/2019 2.1  05/27/2013 1.9   Calcium (mg/dL)  Date Value  04/15/2019 8.2 (L)   Calcium, Total (mg/dL)  Date Value  05/28/2013 9.2   Albumin (g/dL)  Date Value  04/12/2019 2.6 (L)  06/23/2013 3.4   Phosphorus (mg/dL)  Date Value  04/12/2019 4.0   Sodium (mmol/L)  Date Value  04/15/2019 138  05/28/2013 134 (L)   Corrected Ca: 9.32 mg/dL  Assessment: 68 year old female with a past medical history notable for CKD stage III, chronic diastolic congestive heart failure, moderate severe pulmonary hypertension, insulin-dependent diabetes, hypertension, hyperlipidemia and CAD as well as history of colon cancer.  She is on a furosemide infusion running at 5 mg/hr   Goal of Therapy:  Potassium 4.0 - 5.1 mmol/L Magnesium 2.0 - 2.4 mg/dL Other Electrolytes WNL  Plan:   Spironolactone was restarted 11/16 which will increase potassium  Replace with an additional 40 mEq oral KCl x 2  Re-check electrolytes in am  Dallie Piles ,PharmD Clinical Pharmacist 04/15/2019 10:09 AM

## 2019-04-15 NOTE — Progress Notes (Signed)
Post HD Tx    04/15/19 2048  Hand-Off documentation  Report given to (Full Name) Phoebe Sharps, RN   Report received from (Full Name) Beatris Ship, RN   Vital Signs  Temp 98.8 F (37.1 C)  Temp Source Oral  Pulse Rate 73  Pulse Rate Source Monitor  Resp 19  BP (!) 201/108  BP Location Right Arm  BP Method Automatic  Patient Position (if appropriate) Lying  Oxygen Therapy  SpO2 100 %  Pain Assessment  Pain Scale 0-10  Pain Score 0  Dialysis Weight  Weight 103.6 kg  Type of Weight Post-Dialysis  Post-Hemodialysis Assessment  Rinseback Volume (mL) 250 mL  KECN 0 V  Dialyzer Clearance Lightly streaked  Duration of HD Treatment -hour(s) 3 hour(s)  Hemodialysis Intake (mL) 500 mL  UF Total -Machine (mL) 3504 mL  Net UF (mL) 3004 mL  Tolerated HD Treatment Yes  Hemodialysis Catheter Superior Double lumen Permanent (Tunneled)  No Placement Date or Time found.   Placed prior to admission: Yes  Orientation: Superior  Hemodialysis Catheter Type: Double lumen Permanent (Tunneled)  Site Condition No complications  Blue Lumen Status Saline locked  Red Lumen Status Saline locked  Purple Lumen Status Capped (Central line)  Post treatment catheter status Capped and Clamped

## 2019-04-15 NOTE — Progress Notes (Signed)
   04/15/19 1725  Neurological  Level of Consciousness Alert  Orientation Level Oriented X4  Respiratory  Respiratory Pattern Regular;Dyspnea with exertion  Bilateral Breath Sounds Expiratory wheezes  Cardiac  Pulse Regular  Heart Sounds S1, S2  ECG Monitor Yes  Vascular  Edema Generalized  PT STABLE FOR HD TX NO DISTRESS NOTED CVC INTACT UFG 3L SEQUENTIAL ONLY TX

## 2019-04-15 NOTE — TOC Initial Note (Signed)
Transition of Care [facility]) - Initial/Assessment Note    Patient Details  Name: Priscilla Villanueva MRN: [id number] Date of Birth: 07/30/1950  [facility] CM/SW Contact:    Priscilla Anna, LCSW Phone Number: 04/15/2019, 9:59 AM  Clinical Narrative:                  CSW spoke with patient via phone regarding potential discharge plans and to complete high risk screening. Patient currently lives at home with her grandson, Priscilla Villanueva. Patient has previously used [facility] and would prefer to use them again if need- she has a walker at home that she uses. Currently, patient does not think she will need any other equipment.   Patient states she is part of a heart healthy program through [facility] and has frequent follow ups. Patient has a follow up appointment scheduled with PCP (Priscilla Villanueva) on 11/30. Patient voiced no concerns with getting to appointments/ transportation needs due to her grandson providing her assistance.   Patient did ask questions regarding Medicaid and if she would qualify- CSW directed her to [facility] for more specific answers and sent her a Medicaid application link via text message.   No other concerns at this time- will set up [facility] services with [facility] if needed.   Expected Discharge Plan: [place] Barriers to Discharge: No Barriers Identified   Patient Goals and CMS Choice Patient states their goals for this hospitalization and ongoing recovery are:: getting back home CMS Medicare.gov Compare Post Acute Care list provided to:: Patient Choice offered to / list presented to : Patient  Expected Discharge Plan and Services Expected Discharge Plan: [place] In-house Referral: Clinical Social Work   Post Acute Care Choice: [place] arrangements for the past 2 months: [place]                 DME Arranged: N/A         HH Arranged: NA(None at this time)          Prior Living Arrangements/Services Living  arrangements for the past 2 months: [place] Lives with:: Relatives(Lives with grandsonEngineer, structural) Patient language and need for interpreter reviewed:: Yes Do you feel safe going back to the place where you live?: Yes      Need for Family Participation in Patient Care: Yes (Comment) Care giver support system in place?: Yes (comment)      Activities of Daily Living Home Assistive Devices/Equipment: CBG Meter, Shower chair without back, Eyeglasses, Walker (specify type) ADL Screening (condition at time of admission) Patient's cognitive ability adequate to safely complete daily activities?: No Is the patient deaf or have difficulty hearing?: No Does the patient have difficulty seeing, even when wearing glasses/contacts?: No Does the patient have difficulty concentrating, remembering, or making decisions?: No Patient able to express need for assistance with ADLs?: Yes Does the patient have difficulty dressing or bathing?: No Independently performs ADLs?: No Communication: Independent Dressing (OT): Needs assistance Is this a change from baseline?: Pre-admission baseline Grooming: Independent Feeding: Independent Bathing: Needs assistance Is this a change from baseline?: Pre-admission baseline Toileting: Independent In/Out Bed: Independent Walks in Home: Independent Does the patient have difficulty walking or climbing stairs?: Yes Weakness of Legs: Both Weakness of Arms/Hands: Right  Permission Sought/Granted                  Emotional Assessment     Affect (typically observed): Accepting, Calm, Pleasant Orientation: :  Oriented to Self, Oriented to Place, Oriented to  Time, Oriented to Situation   Psych Involvement: No (comment)  Admission diagnosis:  Volume overload AKI CKD level 3 Patient Active Problem List   Diagnosis Date Noted  . Volume overload 04/10/2019  . Hyperlipidemia associated with type 2 diabetes mellitus ([place])   . CKD (chronic kidney disease),  stage III   . Acute CHF (congestive heart failure) ([place]) 03/11/2019  . SOB (shortness of breath) on exertion 12/22/2014  . Colon cancer ([place]) 11/26/2014  . CHF (congestive heart failure) ([place]) 10/01/2014  . Diabetes mellitus, type 2 ([place]) 10/01/2014  . Arteriosclerosis of coronary artery 10/01/2014  . Hypertension associated with diabetes ([place]) 10/01/2014  . Diabetes ([place]) 12/10/2013  . Adiposity 12/10/2013   PCP:  Priscilla Harrier, MD Pharmacy:   [facility] [address], [place] - [place] [address] [place] [address] Phone: [phone] Fax: [phone]     Social Determinants of Health (SDOH) Interventions    Readmission Risk Interventions Readmission Risk Prevention Plan 04/15/2019  Transportation Screening Complete  PCP or Specialist Appt within 3-5 Days Not Complete  Not Complete comments Patient has appointment with PCP 11/30  [place] or Home Care Consult Complete  Social Work Consult for [place] Planning/Counseling [place] Not Applicable  Medication Review (RN Care Manager) Referral to Pharmacy  Some recent data might be hidden

## 2019-04-15 NOTE — Progress Notes (Signed)
[facility]  ROUNDING NOTE   Subjective:   Furosemide gtt 5mg /hr Wt 106.5 kg ( 112.5kg on admission) 11/16 0701 - 11/17 0700 In: 240.6 [I.V.:178.3; IV Piggyback:62.3] Out: 3700 [Urine:1200]  Length of stay: 5 days  Overall feeling fine.  Has bleeding from right femoral catheter site.  Was evaluated by vascular surgeon.  Dressing applied for reinforcement Patient feels that her edema is improving slowly   Objective:  Vital signs in last 24 hours:  Temp:  [98.4 F (36.9 C)-98.8 F (37.1 C)] 98.8 F (37.1 C) (11/17 1215) Pulse Rate:  [66-72] 72 (11/17 1215) Resp:  [20] 20 (11/17 1215) BP: (150-175)/(70-79) 175/79 (11/17 1215) SpO2:  [92 %-96 %] 96 % (11/17 1215) Weight:  [106.6 kg] 106.6 kg (11/17 0438)  Weight change: -0.816 kg Filed Weights   04/13/19 0429 04/14/19 0500 04/15/19 0438  Weight: 107.5 kg 107.4 kg 106.6 kg    Intake/Output: I/O last 3 completed shifts: In: 240.6 [I.V.:178.3; IV Piggyback:62.3] Out: 4900 [Urine:2400; Other:2500]   Intake/Output this shift:  Total I/O In: 13.7 [I.V.:13.7] Out: -   Physical Exam: General: NAD, sitting up on the side of the bed  Head: Normocephalic, atraumatic. Moist oral mucosal membranes  Eyes: Anicteric,   Neck: Supple,   Lungs:   diminished at bases, no crackles  Heart: Regular rate and rhythm  Abdomen:  abdominal wall edema   Extremities: + peripheral edema upto thighs  Neurologic:  Alert and oriented  Skin: No lesions  Access: Right femoral temp catheter 11/14 Dr. Lucky Cowboy    Basic Metabolic Panel: Recent Labs  Lab 04/11/19 0354 04/12/19 0539 04/13/19 0538 04/14/19 0557 04/15/19 0542  NA 145 142  143 140 141 138  K 3.7 3.5  3.5 3.4* 3.2* 3.3*  CL 109 106  107 104 104 104  CO2 28 27  27 27 28 25   GLUCOSE 206* 131*  132* 190* 155* 120*  BUN 21 18  17 16 19 19   CREATININE 1.38* 1.39*  1.42* 1.56* 1.51* 1.49*  CALCIUM 8.3* 8.2*  8.3* 8.1* 8.2* 8.2*  MG 1.8  --   --  1.8 2.1  PHOS   --  4.0  --   --   --     Liver Function Tests: Recent Labs  Lab 04/12/19 0833  ALBUMIN 2.6*   No results for input(s): LIPASE, AMYLASE in the last 168 hours. No results for input(s): AMMONIA in the last 168 hours.  CBC: Recent Labs  Lab 04/10/19 2053 04/11/19 0354 04/12/19 0833 04/14/19 0557 04/15/19 0542  WBC 5.6 5.1 4.7 4.7 4.5  NEUTROABS  --   --  3.2  --  3.0  HGB 11.6* 10.0* 10.2* 10.2* 10.3*  HCT 37.9 34.2* 33.9* 33.6* 33.3*  MCV 82.0 85.3 82.3 83.2 81.0  PLT 148* 153 142* 132* 134*    Cardiac Enzymes: No results for input(s): CKTOTAL, CKMB, CKMBINDEX, TROPONINI in the last 168 hours.  BNP: Invalid input(s): POCBNP  CBG: Recent Labs  Lab 04/14/19 0731 04/14/19 1650 04/14/19 2135 04/15/19 0727 04/15/19 1142  GLUCAP 160* 167* 119* 110* 134*    Microbiology: Results for orders placed or performed during the hospital encounter of 04/10/19  SARS CORONAVIRUS 2 (TAT 6-24 HRS) Nasopharyngeal Nasopharyngeal Swab     Status: None   Collection Time: 04/10/19  9:14 PM   Specimen: Nasopharyngeal Swab  Result Value Ref Range Status   SARS Coronavirus 2 NEGATIVE NEGATIVE Final    Comment: (NOTE) SARS-CoV-2 target nucleic acids are  NOT DETECTED. The SARS-CoV-2 RNA is generally detectable in upper and lower respiratory specimens during the acute phase of infection. Negative results do not preclude SARS-CoV-2 infection, do not rule out co-infections with other pathogens, and should not be used as the sole basis for treatment or other patient management decisions. Negative results must be combined with clinical observations, patient history, and epidemiological information. The expected result is Negative. Fact Sheet for Patients: SugarRoll.be Fact Sheet for Healthcare Providers: https://www.woods-mathews.com/ This test is not yet approved or cleared by the [place] FDA and  has been authorized for detection and/or  diagnosis of SARS-CoV-2 by FDA under an Emergency Use Authorization (EUA). This EUA will remain  in effect (meaning this test can be used) for the duration of the COVID-19 declaration under Section 56 4(b)(1) of the Act, 21 U.S.C. section 360bbb-3(b)(1), unless the authorization is terminated or revoked sooner. Performed at [facility], [place] [address], [place], [place] [address]     Coagulation Studies: No results for input(s): LABPROT, INR in the last 72 hours.  Urinalysis: No results for input(s): COLORURINE, LABSPEC, PHURINE, GLUCOSEU, HGBUR, BILIRUBINUR, KETONESUR, PROTEINUR, UROBILINOGEN, NITRITE, LEUKOCYTESUR in the last 72 hours.  Invalid input(s): APPERANCEUR    Imaging: No results found.   Medications:   . albumin human 12.5 g (04/15/19 1554)  . furosemide (LASIX) infusion 5 mg/hr (04/15/19 1524)   . aspirin EC  81 mg Oral QPM  . atorvastatin  40 mg Oral QPM  . Chlorhexidine Gluconate Cloth  6 each Topical Q0600  . cloNIDine  0.3 mg Oral TID  . hydrALAZINE  25 mg Oral TID  . insulin aspart  0-5 Units Subcutaneous QHS  . insulin aspart  0-9 Units Subcutaneous TID WC  . insulin aspart protamine- aspart  14 Units Subcutaneous BID WC  . metoprolol  200 mg Oral Daily  . potassium chloride  40 mEq Oral BID  . sodium chloride flush  3 mL Intravenous Q12H  . spironolactone  25 mg Oral Daily   acetaminophen **OR** acetaminophen, ALPRAZolam, bisacodyl, ondansetron **OR** ondansetron (ZOFRAN) IV, polyethylene glycol  Assessment/ Plan:  Ms. OMAYRA TULLOCH is a 68 y.o. black female with hypertension, diabetes mellitus type II, hyperlipidemia, depression, history of colon cancer, coronary artery disease, congestive heart failure with diastolic disfunction, right rotator cuff trauma, hyperaldosteronism who is admitted from nephrology clinic on 04/10/2019 for Volume overload AKI CKD level 3  1. Acute renal failure on chronic kidney disease stage IIIB with proteinruia:  Baseline creatinine of 1.6, GFR of 39 on 02/14/19.  Chronic kidney disease secondary to diabetic nephropathy Acute renal failure due to acute cardiorenal syndrome.  Urine output of 1200 cc. Oozing from dialysis catheter- no heparin is being used during dialysis.  Likely high-pressure from right ventricular dysfunction. -Managed with pressure dressing.  Hopefully we will need the catheter only for next 1 to 2 days.  2. LE Edema and Volume overload. Failed outpatient diuretic therapy. Last 2 D Echo(03/11/2019) = LVEF 50-55 %; LVH; Severe Pulmonary Hypertension Now getting iv furosemide and UF only dialysis for volume removal Pulmonary consult for e/m of Pulm HTN Daily standing weight Ultrafiltration 2500 cc -10.6 L so far this admission  3. Hypertension: with chronic kidney disease: home regimen of olmesartan, clonidine, hydralazine, metoprolol and torsemide. With volume removal, blood pressure is expected to get better We will decrease the dose of hydralazine to 25 mg 3 times a day May need to reduce dose of clonidine in next few days.  4. Diabetes mellitus type II with chronic kidney disease: insulin dependent. Hemoglobin A1c of 7.2% on 03/11/2019 - holding metformin  5.  Hypokalemia -Potassium supplementation -Start spironolactone 25 mg daily  6.  Hypoalbuminemia -We will supplement low-dose albumin for oncotic support to assist with volume removal    LOS: 5 Priscilla Villanueva 11/17/20204:32 PM

## 2019-04-15 NOTE — Progress Notes (Signed)
Notified Dr. Lucky Cowboy of bleeding from patient's temp femoral HD catheter.  Reinforced, continue to monitor.  Bedrest for four hours.

## 2019-04-15 NOTE — Progress Notes (Signed)
Post HD Assessment    04/15/19 2050  Neurological  Level of Consciousness Alert  Orientation Level Oriented X4  Respiratory  Respiratory Pattern Regular;Unlabored  Chest Assessment Chest expansion symmetrical  Bilateral Breath Sounds Clear;Diminished  Cough None  Cardiac  Pulse Regular  Heart Sounds S1, S2  ECG Monitor Yes  Vascular  Edema Generalized

## 2019-04-15 NOTE — Progress Notes (Signed)
Patient's femoral trialysis site contains blood clots so I just reinforced with gauze around the site as to slow down the oozing. Will continue to monitor.  Christene Slates

## 2019-04-15 NOTE — Progress Notes (Signed)
[facility]  Daily Progress Note   Subjective  -   Asked to see the patient by the nurse for oozing around her dialysis catheter.  She had dialysis yesterday and had a steady ooze and a fair bit of clot around the catheter this morning.  She is scheduled for dialysis again today.  The patient has no complaints.  Her hemoglobin was 10.2 today.  Objective Vitals:   04/14/19 1250 04/14/19 1300 04/14/19 2129 04/15/19 0438  BP: (!) 154/85 (!) 163/71 (!) 162/74 (!) 150/70  Pulse: 63 65 69 66  Resp: (!) 23 20 20 20   Temp:  98.7 F (37.1 C) 98.5 F (36.9 C) 98.4 F (36.9 C)  TempSrc:  Oral Oral Oral  SpO2:  100% 92% 94%  Weight:    106.6 kg  Height:        Intake/Output Summary (Last 24 hours) at 04/15/2019 1002 Last data filed at 04/15/2019 0610 Gross per 24 hour  Intake 66.68 ml  Output 3500 ml  Net -3433.32 ml    PULM  CTAB CV  RRR VASC  old clot around the dialysis catheter with minimal current oozing.  Large dressing has been reinforced.  Laboratory CBC    Component Value Date/Time   WBC 4.7 04/14/2019 0557   HGB 10.2 (L) 04/14/2019 0557   HGB 11.6 (L) 04/13/2014 1540   HCT 33.6 (L) 04/14/2019 0557   HCT 39.2 12/08/2013 1437   PLT 132 (L) 04/14/2019 0557   PLT 169 12/08/2013 1437    BMET    Component Value Date/Time   NA 138 04/15/2019 0542   NA 134 (L) 05/28/2013 0825   K 3.3 (L) 04/15/2019 0542   K 3.5 05/28/2013 0825   CL 104 04/15/2019 0542   CL 99 05/28/2013 0825   CO2 25 04/15/2019 0542   CO2 25 05/28/2013 0825   GLUCOSE 120 (H) 04/15/2019 0542   GLUCOSE 222 (H) 05/28/2013 0825   BUN 19 04/15/2019 0542   BUN 30 (H) 05/28/2013 0825   CREATININE 1.49 (H) 04/15/2019 0542   CREATININE 1.40 (H) 05/28/2013 0825   CALCIUM 8.2 (L) 04/15/2019 0542   CALCIUM 9.2 05/28/2013 0825   GFRNONAA 36 (L) 04/15/2019 0542   GFRNONAA 41 (L) 05/28/2013 0825   GFRAA 42 (L) 04/15/2019 0542   GFRAA 47 (L) 05/28/2013 0825     Assessment/Planning: POD #5 s/p right femoral temporary dialysis catheter   Having oozing around her dialysis catheter likely after heparinization from dialysis yesterday.  For dialysis again today.  Would keep on bed rest and limit her heparin on dialysis if possible.  Once they are completed with the dialysis runs, the temporary catheter can be removed.  No further recommendations from vascular point of view at this point.    Leotis Pain  04/15/2019, 10:02 AM

## 2019-04-16 DIAGNOSIS — J810 Acute pulmonary edema: Secondary | ICD-10-CM

## 2019-04-16 DIAGNOSIS — N186 End stage renal disease: Secondary | ICD-10-CM

## 2019-04-16 DIAGNOSIS — Z992 Dependence on renal dialysis: Secondary | ICD-10-CM

## 2019-04-16 DIAGNOSIS — T82898A Other specified complication of vascular prosthetic devices, implants and grafts, initial encounter: Secondary | ICD-10-CM

## 2019-04-16 DIAGNOSIS — J811 Chronic pulmonary edema: Secondary | ICD-10-CM | POA: Insufficient documentation

## 2019-04-16 LAB — BASIC METABOLIC PANEL
Anion gap: 19 — ABNORMAL HIGH (ref 5–15)
BUN: 21 mg/dL (ref 8–23)
CO2: 24 mmol/L (ref 22–32)
Calcium: 8.6 mg/dL — ABNORMAL LOW (ref 8.9–10.3)
Chloride: 97 mmol/L — ABNORMAL LOW (ref 98–111)
Creatinine, Ser: 1.51 mg/dL — ABNORMAL HIGH (ref 0.44–1.00)
GFR calc Af Amer: 41 mL/min — ABNORMAL LOW (ref >60)
GFR calc non Af Amer: 36 mL/min — ABNORMAL LOW (ref >60)
Glucose, Bld: 206 mg/dL — ABNORMAL HIGH (ref 70–99)
Potassium: 3.9 mmol/L (ref 3.5–5.1)
Sodium: 140 mmol/L (ref 135–145)

## 2019-04-16 LAB — GLUCOSE, CAPILLARY
Glucose-Capillary: 169 mg/dL — ABNORMAL HIGH (ref 70–99)
Glucose-Capillary: 178 mg/dL — ABNORMAL HIGH (ref 70–99)
Glucose-Capillary: 218 mg/dL — ABNORMAL HIGH (ref 70–99)
Glucose-Capillary: 299 mg/dL — ABNORMAL HIGH (ref 70–99)

## 2019-04-16 MED ORDER — POTASSIUM CHLORIDE CRYS ER 20 MEQ PO TBCR
20.0000 meq | EXTENDED_RELEASE_TABLET | Freq: Once | ORAL | Status: AC
  Administered 2019-04-16: 20 meq via ORAL
  Filled 2019-04-16: qty 1

## 2019-04-16 MED ORDER — NEPRO/CARBSTEADY PO LIQD
237.0000 mL | Freq: Two times a day (BID) | ORAL | Status: DC
  Administered 2019-04-16 – 2019-04-17 (×3): 237 mL via ORAL

## 2019-04-16 MED ORDER — RENA-VITE PO TABS
1.0000 | ORAL_TABLET | Freq: Every day | ORAL | Status: DC
  Administered 2019-04-16: 1 via ORAL
  Filled 2019-04-16: qty 1

## 2019-04-16 NOTE — Progress Notes (Addendum)
Hospitalist and Vascular service: The patient is still oozing from her right femoral site where they placed the trialysis catheter when you have an opportunity can you come to see site.   Hospitalist notified: The patient wants to know if physical therapy can see her. She indicates she has been feeling weak

## 2019-04-16 NOTE — Progress Notes (Signed)
HD Treatment Started    04/16/19 0930  Vital Signs  Pulse Rate 91  Resp (!) 25  BP (!) 182/89  Oxygen Therapy  SpO2 99 %  During Hemodialysis Assessment  Blood Flow Rate (mL/min) 300 mL/min  Arterial Pressure (mmHg) -160 mmHg  Venous Pressure (mmHg) 110 mmHg  Transmembrane Pressure (mmHg) 60 mmHg  Ultrafiltration Rate (mL/min) 1170 mL/min  Dialysate Flow Rate (mL/min) 0 ml/min  Conductivity: Machine  14  HD Safety Checks Performed Yes  Dialysis Fluid Bolus Normal Saline  Bolus Amount (mL) 250 mL  Intra-Hemodialysis Comments Tx initiated

## 2019-04-16 NOTE — Progress Notes (Signed)
PT Cancellation Note  Patient Details Name: Priscilla Villanueva MRN: [id number] DOB: 11-Aug-1950   Cancelled Treatment:    Reason Eval/Treat Not Completed: (Chart reviewed for re-attempt at evaluation after return from dialysis.  Per further chart review, patient noted with R temp fem dialysis catheter with persistent oozing from site.  Per primary RN, vascular surgeon to assess.  Will continue hold at this time and re-attempt next date.)  Of note, per guidelines, patient to remain on bedrest while temp dialysis catheter in place.  Will follow for plans of use/discontinuation and progress as able.   Shauntee Karp H. Owens Shark, PT, DPT, NCS 04/16/19, 3:35 PM [phone]

## 2019-04-16 NOTE — Progress Notes (Signed)
Pre HD Assessment    04/16/19 0910  Neurological  Level of Consciousness Alert  Orientation Level Oriented X4  Respiratory  Respiratory Pattern Regular;Unlabored  Chest Assessment Chest expansion symmetrical  Bilateral Breath Sounds Clear;Diminished  Cardiac  Pulse Regular  Heart Sounds S1, S2  ECG Monitor Yes  Vascular  R Radial Pulse +2  L Radial Pulse +2  Edema Generalized  Psychosocial  Psychosocial (WDL) WDL

## 2019-04-16 NOTE — Progress Notes (Signed)
PT Cancellation Note  Patient Details Name: Priscilla Villanueva MRN: [id number] DOB: [birth year]   Cancelled Treatment:    Reason Eval/Treat Not Completed: Patient at procedure or test/unavailable(Consult received and chart reviewed. Patient currently off unit for dialysis.  Will re-attempt at later time/date as medically appropriate and available.)   Malashia Kamaka H. Owens Shark, PT, DPT, NCS 04/16/19, 10:13 AM [phone]

## 2019-04-16 NOTE — Consult Note (Signed)
PHARMACY CONSULT NOTE  Pharmacy Consult for Electrolyte Monitoring and Replacement   Recent Labs: Potassium (mmol/L)  Date Value  04/16/2019 3.9  05/28/2013 3.5   Magnesium (mg/dL)  Date Value  04/15/2019 2.1  05/27/2013 1.9   Calcium (mg/dL)  Date Value  04/16/2019 8.6 (L)   Calcium, Total (mg/dL)  Date Value  05/28/2013 9.2   Albumin (g/dL)  Date Value  04/12/2019 2.6 (L)  06/23/2013 3.4   Phosphorus (mg/dL)  Date Value  04/12/2019 4.0   Sodium (mmol/L)  Date Value  04/16/2019 140  05/28/2013 134 (L)   Corrected Ca: 9.72 mg/dL  Assessment: 68 year old female with a past medical history notable for CKD stage III, chronic diastolic congestive heart failure, moderate severe pulmonary hypertension, insulin-dependent diabetes, hypertension, hyperlipidemia and CAD as well as history of colon cancer.  She is on a furosemide infusion running at 5 mg/hr   Goal of Therapy:  Potassium 4.0 - 5.1 mmol/L Magnesium 2.0 - 2.4 mg/dL Other Electrolytes WNL  Plan:   Spironolactone was restarted 11/16 which will increase potassium  Replace with an additional 20 mEq oral KCl x 1  Re-check electrolytes in am  Dallie Piles ,PharmD Clinical Pharmacist 04/16/2019 9:26 AM

## 2019-04-16 NOTE — Progress Notes (Signed)
[facility]  ROUNDING NOTE   Subjective:   Furosemide gtt 5mg /hr Wt 99.8 kg ( 112.5kg on admission) 11/17 0701 - 11/18 0700 In: 15 [I.V.:17.5; IV Piggyback:41.5] Out: 9476 [Urine:800]  Length of stay: 6 days  Overall feeling fine.  Has bleeding from right femoral catheter site.  Was evaluated by vascular surgeon.   Patient feels that her edema is improving slowly Feels tired today   HEMODIALYSIS FLOWSHEET:  Blood Flow Rate (mL/min): 300 mL/min Arterial Pressure (mmHg): -160 mmHg Venous Pressure (mmHg): 110 mmHg Transmembrane Pressure (mmHg): 60 mmHg Ultrafiltration Rate (mL/min): 1170 mL/min Dialysate Flow Rate (mL/min): 0 ml/min Conductivity: Machine : 14 Conductivity: Machine : 14 Dialysis Fluid Bolus: Normal Saline Bolus Amount (mL): 250 mL  Seen during HD    Objective:  Vital signs in last 24 hours:  Temp:  [98.5 F (36.9 C)-99.2 F (37.3 C)] 98.6 F (37 C) (11/18 1639) Pulse Rate:  [70-95] 82 (11/18 1639) Resp:  [16-29] 20 (11/18 1639) BP: (96-212)/(70-151) 154/80 (11/18 1639) SpO2:  [84 %-100 %] 99 % (11/18 1639) Weight:  [99.8 kg-103.6 kg] 99.8 kg (11/18 1230)  Weight change: -2.995 kg Filed Weights   04/16/19 0249 04/16/19 0915 04/16/19 1230  Weight: 102.7 kg 102.7 kg 99.8 kg    Intake/Output: I/O last 3 completed shifts: In: 40 [I.V.:17.5; IV Piggyback:41.5] Out: 5465 [Urine:1800; KPTWS:5681]   Intake/Output this shift:  Total I/O In: 11.3 [IV Piggyback:11.3] Out: 3662 [Urine:900; Other:2762]  Physical Exam: General: NAD,   Head: Normocephalic, atraumatic. Moist oral mucosal membranes  Eyes: Anicteric,   Neck: Supple,   Lungs:   diminished at bases, no crackles  Heart: Regular rate and rhythm  Abdomen:  abdominal wall edema   Extremities: + peripheral edema upto thighs  Neurologic:  Alert and oriented  Skin: No lesions  Access: Right femoral temp catheter 11/14 Dr. Lucky Cowboy    Basic Metabolic Panel: Recent Labs  Lab  04/11/19 0354 04/12/19 2751 04/13/19 0538 04/14/19 0557 04/15/19 0542 04/16/19 0532  NA 145 142  143 140 141 138 140  K 3.7 3.5  3.5 3.4* 3.2* 3.3* 3.9  CL 109 106  107 104 104 104 97*  CO2 28 27  27 27 28 25 24   GLUCOSE 206* 131*  132* 190* 155* 120* 206*  BUN 21 18  17 16 19 19 21   CREATININE 1.38* 1.39*  1.42* 1.56* 1.51* 1.49* 1.51*  CALCIUM 8.3* 8.2*  8.3* 8.1* 8.2* 8.2* 8.6*  MG 1.8  --   --  1.8 2.1  --   PHOS  --  4.0  --   --   --   --     Liver Function Tests: Recent Labs  Lab 04/12/19 0833  ALBUMIN 2.6*   No results for input(s): LIPASE, AMYLASE in the last 168 hours. No results for input(s): AMMONIA in the last 168 hours.  CBC: Recent Labs  Lab 04/10/19 2053 04/11/19 0354 04/12/19 0833 04/14/19 0557 04/15/19 0542  WBC 5.6 5.1 4.7 4.7 4.5  NEUTROABS  --   --  3.2  --  3.0  HGB 11.6* 10.0* 10.2* 10.2* 10.3*  HCT 37.9 34.2* 33.9* 33.6* 33.3*  MCV 82.0 85.3 82.3 83.2 81.0  PLT 148* 153 142* 132* 134*    Cardiac Enzymes: No results for input(s): CKTOTAL, CKMB, CKMBINDEX, TROPONINI in the last 168 hours.  BNP: Invalid input(s): POCBNP  CBG: Recent Labs  Lab 04/15/19 1142 04/15/19 2129 04/16/19 0759 04/16/19 1328 04/16/19 1612  GLUCAP 134* 144*  169* 178* 218*    Microbiology: Results for orders placed or performed during the hospital encounter of 04/10/19  SARS CORONAVIRUS 2 (TAT 6-24 HRS) Nasopharyngeal Nasopharyngeal Swab     Status: None   Collection Time: 04/10/19  9:14 PM   Specimen: Nasopharyngeal Swab  Result Value Ref Range Status   SARS Coronavirus 2 NEGATIVE NEGATIVE Final    Comment: (NOTE) SARS-CoV-2 target nucleic acids are NOT DETECTED. The SARS-CoV-2 RNA is generally detectable in upper and lower respiratory specimens during the acute phase of infection. Negative results do not preclude SARS-CoV-2 infection, do not rule out co-infections with other pathogens, and should not be used as the sole basis for treatment or  other patient management decisions. Negative results must be combined with clinical observations, patient history, and epidemiological information. The expected result is Negative. Fact Sheet for Patients: SugarRoll.be Fact Sheet for Healthcare Providers: https://www.woods-mathews.com/ This test is not yet approved or cleared by the [place] FDA and  has been authorized for detection and/or diagnosis of SARS-CoV-2 by FDA under an Emergency Use Authorization (EUA). This EUA will remain  in effect (meaning this test can be used) for the duration of the COVID-19 declaration under Section 56 4(b)(1) of the Act, 21 U.S.C. section 360bbb-3(b)(1), unless the authorization is terminated or revoked sooner. Performed at [facility], [place] [address], [place], [place] [address]     Coagulation Studies: No results for input(s): LABPROT, INR in the last 72 hours.  Urinalysis: No results for input(s): COLORURINE, LABSPEC, PHURINE, GLUCOSEU, HGBUR, BILIRUBINUR, KETONESUR, PROTEINUR, UROBILINOGEN, NITRITE, LEUKOCYTESUR in the last 72 hours.  Invalid input(s): APPERANCEUR    Imaging: No results found.   Medications:   . albumin human 60 mL/hr at 04/16/19 1516  . furosemide (LASIX) infusion 5 mg/hr (04/15/19 1701)   . aspirin EC  81 mg Oral QPM  . atorvastatin  40 mg Oral QPM  . Chlorhexidine Gluconate Cloth  6 each Topical Q0600  . cloNIDine  0.3 mg Oral TID  . feeding supplement (NEPRO CARB STEADY)  237 mL Oral BID BM  . hydrALAZINE  25 mg Oral TID  . insulin aspart  0-5 Units Subcutaneous QHS  . insulin aspart  0-9 Units Subcutaneous TID WC  . insulin aspart protamine- aspart  14 Units Subcutaneous BID WC  . metoprolol  200 mg Oral Daily  . multivitamin  1 tablet Oral QHS  . pantoprazole  40 mg Oral QAC supper  . sodium chloride flush  3 mL Intravenous Q12H  . spironolactone  25 mg Oral Daily   acetaminophen **OR**  acetaminophen, ALPRAZolam, bisacodyl, ondansetron **OR** ondansetron (ZOFRAN) IV, polyethylene glycol  Assessment/ Plan:  Ms. KINDRED HEYING is a 68 y.o. black female with hypertension, diabetes mellitus type II, hyperlipidemia, depression, history of colon cancer, coronary artery disease, congestive heart failure with diastolic disfunction, right rotator cuff trauma, hyperaldosteronism who is admitted from nephrology clinic on 04/10/2019 for Volume overload AKI CKD level 3  1. 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  Chronic kidney disease secondary to diabetic nephropathy Acute renal failure due to acute cardiorenal syndrome.    Oozing from dialysis catheter- no heparin is being used during dialysis.  Likely high-pressure from right ventricular dysfunction. -Managed with pressure dressing.      2. LE Edema and Volume overload. Failed outpatient diuretic therapy. Last 2 D Echo (03/11/2019) = LVEF 50-55 %; LVH; Severe Pulmonary  Hypertension Now getting iv furosemide and UF only dialysis for volume removal Pulmonary HTN- Dr Duwayne Heck recommends evaluation at [facility] Ultrafiltration goal 2.5 - 3 L  cc -18 L so far this admission  3. Hypertension: with chronic kidney disease:   With volume removal, blood pressure is expected to get better   4. Diabetes mellitus type II with chronic kidney disease: insulin dependent. Hemoglobin A1c of 7.2% on 03/11/2019 - holding metformin  5.  Hypokalemia -Potassium supplementation -continue spironolactone 25 mg daily  6.  Hypoalbuminemia - d/c iv albumin    LOS: 6 Carlson Belland 11/18/20205:40 PM

## 2019-04-16 NOTE — Progress Notes (Signed)
Post HD Assessment    04/16/19 1230  Neurological  Level of Consciousness Alert  Orientation Level Oriented X4  Respiratory  Respiratory Pattern Regular;Unlabored  Chest Assessment Chest expansion symmetrical  Bilateral Breath Sounds Clear;Diminished  Cardiac  Pulse Regular  Heart Sounds S1, S2  ECG Monitor Yes  Vascular  R Radial Pulse +2  L Radial Pulse +2  Edema Generalized  Psychosocial  Psychosocial (WDL) WDL

## 2019-04-16 NOTE — Progress Notes (Signed)
PROGRESS NOTE    PRINCESA WILLIG  UEA:[id number] DOB: 1950/11/08 DOA: 04/10/2019 PCP: Tracie Harrier, MD    Brief Narrative:   Priscilla Villanueva is a 68 y.o. female with medical history significant for CKD stage III, chronic diastolic CHF, mod-severe pulmonary hypertension, insulin-dependent type 2 diabetes, hypertension, hyperlipidemia, CAD, and history of colon cancer who is a direct admission per nephrology for further management of progressive volume overload.  Patient reports about 40 pound progressive weight gain over the last month despite home diuretics.  She has been having increased swelling in her legs and abdomen.  She has been having progressive shortness of breath with exertion and at rest as well as orthopnea.  She reports occasional cough productive of slightly yellow sputum.  She denies any chest pain, palpitations, fevers, chills, diaphoresis, abdominal pain, dysuria, or diarrhea.  She was seen by her nephrologist Dr. Candiss Norse on 04/09/2019 who subsequently recommended direct admission to the hospital for management of progressive volume overload with ultrafiltration dialysis.  Interim History:  -11/18: pt had Ultrafiltration goal 2.5 - 3 L  cc, -18 L so far this admission -11/18: Oozing from dialysis catheter- no heparin is being used during dialysis per renal. seen by VVS Subjective: -pt has mild SOB and mild cough. No CP. No fever or chills. No nausea, vomiting, diarrhea or AP.   Assessment & Plan:   Principal Problem:   Volume overload Active Problems:   Chronic diastolic CHF (congestive heart failure) (HCC)   Type II diabetes mellitus with renal manifestations (HCC)   Hypertension associated with diabetes ([place])   Hyperlipidemia associated with type 2 diabetes mellitus (HCC)   CKD (chronic kidney disease), stage III  Hypervolemiawith chronic diastolic heart failure moderate severe pulmonary hypertension: -on lasix gtt 5mg /hr  -HD to assist with volume  removal. 11/18 pt Had Ultrafiltration goal 2.5 - 3 L  cc, -18 L so far this admission -Continue to monitor strict I's and O's and daily weights -Dialysis catheter placement by vascular surgery11/13 -Nephrology and vascular surgery involved  CKD stage IIIa: -Dialysis catheter as above, -Monitor electrolytes, cautiously replete -volume management per renal -Oozing at the dialysis catheter site, was seen by vascular surgery. Anticipate resolution with cessation of dialysis and heparin and discontinuation of catheter  Insulin-dependent diabetes type 2: -Continue reduced dose of Humalog -Sliding scale insulin light  Hypertension: -Continue patient's clonidine, hydralazine, Toprol -Holding ARB  Hyperlipidemia: -Continue statin   DVT prophylaxis: SCD Code Status: full Family Communication: no, not at bedside Disposition Plan: need to reevaluate volume status in AM.     Consultants:   Renal  VVS  Procedures: s/p placement of a right femoral temporary dialysis catheter   Antimicrobials:     Objective: Vitals:   04/16/19 1320 04/16/19 1426 04/16/19 1639 04/16/19 2043  BP: (!) 203/80 (!) 145/70 (!) 154/80 122/66  Pulse: 92 86 82 80  Resp: 18 20 20 18   Temp: 98.8 F (37.1 C) 99.2 F (37.3 C) 98.6 F (37 C) 98.9 F (37.2 C)  TempSrc: Oral Oral Oral Oral  SpO2: 95% 95% 99% 100%  Weight:      Height:        Intake/Output Summary (Last 24 hours) at 04/16/2019 2121 Last data filed at 04/16/2019 1945 Gross per 24 hour  Intake 39.7 ml  Output 3662 ml  Net -3622.3 ml   Filed Weights   04/16/19 0249 04/16/19 0915 04/16/19 1230  Weight: 102.7 kg 102.7 kg 99.8 kg    Examination:  Physical Exam:  General: Not in acute distress HEENT: PERRL, EOMI, no scleral icterus, No JVD or bruit Cardiac: S1/S2, RRR, No murmurs, gallops or rubs Pulm: Clear to auscultation bilaterally. No rales, wheezing, rhonchi or rubs. Abd: Soft, nondistended, nontender, no rebound  pain, no organomegaly, BS present Ext: 1+ leg upto thigh bilaterally. 1+DP/PT pulse bilaterally Musculoskeletal: No joint deformities, erythema, or stiffness, ROM full Skin: No rashes.  Neuro: Alert and oriented X3, cranial nerves II-XII grossly intact, moves all extremeties normally.  Psych: Patient is not psychotic, no suicidal or hemocidal ideation.    Data Reviewed: I have personally reviewed following labs and imaging studies  CBC: Recent Labs  Lab 04/10/19 2053 04/11/19 0354 04/12/19 0833 04/14/19 0557 04/15/19 0542  WBC 5.6 5.1 4.7 4.7 4.5  NEUTROABS  --   --  3.2  --  3.0  HGB 11.6* 10.0* 10.2* 10.2* 10.3*  HCT 37.9 34.2* 33.9* 33.6* 33.3*  MCV 82.0 85.3 82.3 83.2 81.0  PLT 148* 153 142* 132* 149*   Basic Metabolic Panel: Recent Labs  Lab 04/11/19 0354 04/12/19 0833 04/13/19 0538 04/14/19 0557 04/15/19 0542 04/16/19 0532  NA 145 142  143 140 141 138 140  K 3.7 3.5  3.5 3.4* 3.2* 3.3* 3.9  CL 109 106  107 104 104 104 97*  CO2 28 27  27 27 28 25 24   GLUCOSE 206* 131*  132* 190* 155* 120* 206*  BUN 21 18  17 16 19 19 21   CREATININE 1.38* 1.39*  1.42* 1.56* 1.51* 1.49* 1.51*  CALCIUM 8.3* 8.2*  8.3* 8.1* 8.2* 8.2* 8.6*  MG 1.8  --   --  1.8 2.1  --   PHOS  --  4.0  --   --   --   --    GFR: Estimated Creatinine Clearance: 42.1 mL/min (A) (by C-G formula based on SCr of 1.51 mg/dL (H)). Liver Function Tests: Recent Labs  Lab 04/12/19 0833  ALBUMIN 2.6*   No results for input(s): LIPASE, AMYLASE in the last 168 hours. No results for input(s): AMMONIA in the last 168 hours. Coagulation Profile: No results for input(s): INR, PROTIME in the last 168 hours. Cardiac Enzymes: No results for input(s): CKTOTAL, CKMB, CKMBINDEX, TROPONINI in the last 168 hours. BNP (last 3 results) No results for input(s): PROBNP in the last 8760 hours. HbA1C: No results for input(s): HGBA1C in the last 72 hours. CBG: Recent Labs  Lab 04/15/19 1142 04/15/19 2129  04/16/19 0759 04/16/19 1328 04/16/19 1612  GLUCAP 134* 144* 169* 178* 218*   Lipid Profile: No results for input(s): CHOL, HDL, LDLCALC, TRIG, CHOLHDL, LDLDIRECT in the last 72 hours. Thyroid Function Tests: No results for input(s): TSH, T4TOTAL, FREET4, T3FREE, THYROIDAB in the last 72 hours. Anemia Panel: No results for input(s): VITAMINB12, FOLATE, FERRITIN, TIBC, IRON, RETICCTPCT in the last 72 hours. Sepsis Labs: No results for input(s): PROCALCITON, LATICACIDVEN in the last 168 hours.  Recent Results (from the past 240 hour(s))  SARS CORONAVIRUS 2 (TAT 6-24 HRS) Nasopharyngeal Nasopharyngeal Swab     Status: None   Collection Time: 04/10/19  9:14 PM   Specimen: Nasopharyngeal Swab  Result Value Ref Range Status   SARS Coronavirus 2 NEGATIVE NEGATIVE Final    Comment: (NOTE) SARS-CoV-2 target nucleic acids are NOT DETECTED. The SARS-CoV-2 RNA is generally detectable in upper and lower respiratory specimens during the acute phase of infection. Negative results do not preclude SARS-CoV-2 infection, do not rule out co-infections with other pathogens, and  should not be used as the sole basis for treatment or other patient management decisions. Negative results must be combined with clinical observations, patient history, and epidemiological information. The expected result is Negative. Fact Sheet for Patients: SugarRoll.be Fact Sheet for Healthcare Providers: https://www.woods-mathews.com/ This test is not yet approved or cleared by the [place] FDA and  has been authorized for detection and/or diagnosis of SARS-CoV-2 by FDA under an Emergency Use Authorization (EUA). This EUA will remain  in effect (meaning this test can be used) for the duration of the COVID-19 declaration under Section 56 4(b)(1) of the Act, 21 U.S.C. section 360bbb-3(b)(1), unless the authorization is terminated or revoked sooner. Performed at [facility], [place] [address], [place], [place] [address]       Radiology Studies: No results found.      Scheduled Meds: . aspirin EC  81 mg Oral QPM  . atorvastatin  40 mg Oral QPM  . Chlorhexidine Gluconate Cloth  6 each Topical Q0600  . cloNIDine  0.3 mg Oral TID  . feeding supplement (NEPRO CARB STEADY)  237 mL Oral BID BM  . hydrALAZINE  25 mg Oral TID  . insulin aspart  0-5 Units Subcutaneous QHS  . insulin aspart  0-9 Units Subcutaneous TID WC  . insulin aspart protamine- aspart  14 Units Subcutaneous BID WC  . metoprolol  200 mg Oral Daily  . multivitamin  1 tablet Oral QHS  . pantoprazole  40 mg Oral QAC supper  . sodium chloride flush  3 mL Intravenous Q12H  . spironolactone  25 mg Oral Daily   Continuous Infusions: . furosemide (LASIX) infusion 5 mg/hr (04/15/19 1701)     LOS: 6 days    Time spent: 30 min     Ivor Costa, DO [facility] PAGER is on [place]  If 7PM-7AM, please contact night-coverage [email or web address] Password [email or web address] 04/16/2019, 9:21 PM ;rog

## 2019-04-16 NOTE — Progress Notes (Signed)
HD Tx completed and tolerated well   04/16/19 1215  Vital Signs  Pulse Rate 92  Resp (!) 22  BP (!) 182/94  BP Location Left Arm  BP Method Automatic  Patient Position (if appropriate) Lying  Oxygen Therapy  SpO2 100 %  O2 Device Room Air  During Hemodialysis Assessment  HD Safety Checks Performed Yes  KECN 0 KECN  Dialysis Fluid Bolus Normal Saline  Bolus Amount (mL) 250 mL  Intra-Hemodialysis Comments Tx completed;Tolerated well

## 2019-04-16 NOTE — Progress Notes (Signed)
Post HD TX    04/16/19 1230  Vital Signs  Temp 98.5 F (36.9 C)  Temp Source Oral  Pulse Rate 88  Pulse Rate Source Monitor  Resp (!) 26  BP (!) 195/81  BP Location Left Arm  BP Method Automatic  Patient Position (if appropriate) Lying  Oxygen Therapy  SpO2 100 %  O2 Device Room Air  Pain Assessment  Pain Scale 0-10  Pain Score 0  Dialysis Weight  Weight 99.8 kg  Type of Weight Post-Dialysis  Post-Hemodialysis Assessment  Rinseback Volume (mL) 250 mL  KECN 0 V  Dialyzer Clearance Lightly streaked  Duration of HD Treatment -hour(s) 3 hour(s)  Hemodialysis Intake (mL) 500 mL  UF Total -Machine (mL) 3262 mL  Net UF (mL) 2762 mL  Tolerated HD Treatment Yes  AVG/AVF Arterial Site Held (minutes) 10 minutes  AVG/AVF Venous Site Held (minutes) 5 minutes  Hemodialysis Catheter Superior Double lumen Permanent (Tunneled)  No Placement Date or Time found.   Placed prior to admission: Yes  Orientation: Superior  Hemodialysis Catheter Type: Double lumen Permanent (Tunneled)  Site Condition Bleeding  Blue Lumen Status Saline locked  Red Lumen Status Saline locked  Post treatment catheter status Capped and Clamped

## 2019-04-16 NOTE — Progress Notes (Signed)
Pre HD Treatment   04/16/19 0915  Hand-Off documentation  Report given to (Full Name) Beatris Ship, RN   Report received from (Full Name) Phoebe Sharps, RN   Vital Signs  Temp 98.5 F (36.9 C)  Temp Source Oral  Pulse Rate 95  Pulse Rate Source Monitor  Resp (!) 29  BP (!) 186/77  BP Location Left Arm  BP Method Automatic  Patient Position (if appropriate) Lying  Oxygen Therapy  SpO2 (!) 84 %  O2 Device Nasal Cannula  O2 Flow Rate (L/min) 2 L/min  Pulse Oximetry Type Continuous  Pain Assessment  Pain Scale 0-10  Pain Score 0  Dialysis Weight  Weight 102.7 kg  Type of Weight Pre-Dialysis  Time-Out for Hemodialysis  What Procedure? HD   Pt Identifiers(min of two) First/Last Name  Correct Site? Yes  Correct Side? Yes  Correct Procedure? Yes  Consents Verified? Yes  Rad Studies Available? N/A  Safety Precautions Reviewed? Yes  Engineer, civil (consulting) Number 1  Station Number 2  UF/Alarm Test Passed  Conductivity: Meter 13.8  Conductivity: Machine  13.9  pH 7.2  Reverse Osmosis Main  Normal Saline Lot Number [id number]  Dialyzer Lot Number [id number]  Disposable Set Lot Number [id number]  Machine Temperature 98.6 F (37 C)  Musician and Audible Yes  Blood Lines Intact and Secured Yes  Pre Treatment Patient Checks  Vascular access used during treatment Catheter  HD catheter dressing before treatment WDL  Hepatitis B Surface Antigen Results Negative  Date Hepatitis B Surface Antigen Drawn 04/11/19  Hepatitis B Surface Antibody  (<10)  Date Hepatitis B Surface Antibody Drawn 04/11/19  Hemodialysis Consent Verified Yes  Hemodialysis Standing Orders Initiated Yes  ECG (Telemetry) Monitor On Yes  Prime Ordered Normal Saline  Length of  DialysisTreatment -hour(s) 3 Hour(s)  Dialysis Treatment Comments Sequential Only   Dialyzer Elisio 17H NR  Dialysate Other (Comment) (Sequential Only )  Dialysis Anticoagulant None  Dialysate Flow Ordered 800  Blood Flow Rate  Ordered 300 mL/min  Ultrafiltration Goal 2.5 Liters  Dialysis Blood Pressure Support Ordered Normal Saline  Education / Care Plan  Dialysis Education Provided Yes  Documented Education in Care Plan Yes  Hemodialysis Catheter Superior Double lumen Permanent (Tunneled)  No Placement Date or Time found.   Placed prior to admission: Yes  Orientation: Superior  Hemodialysis Catheter Type: Double lumen Permanent (Tunneled)  Site Condition Bleeding  Blue Lumen Status Blood return noted  Red Lumen Status Blood return noted  Purple Lumen Status Capped (Central line)  Dressing Type Biopatch;Occlusive;Gauze/Drain sponge  Dressing Status Clean;Dry;Intact

## 2019-04-16 NOTE — Progress Notes (Addendum)
Initial Nutrition Assessment  DOCUMENTATION CODES:   Obesity unspecified  INTERVENTION:  Liberalize diet (3 gram sodium/ carb Mod) Provide Nepro BID (butter pecan flavor) Each supplement provides 425 calories and 19.1 grams of protein Provide Prostat BID (each supplement provides 100 calories and 15g protein) Provide Rena-Vite at bedtime  NUTRITION DIAGNOSIS:   Inadequate oral intake related to poor appetite as evidenced by per patient/family report.  GOAL:   Patient will meet greater than or equal to 90% of their needs  MONITOR:   PO intake, Supplement acceptance, Labs, Weight trends, Skin, I & O's  REASON FOR ASSESSMENT:   Diagnosis   ASSESSMENT:   68 y.o. female with hx significant for CKD stage III, chronic diastolic CHF, mod-severe pulmonary hypertension, insulin-dependent type 2 diabetes, HTN, HLD, CAD, and history of colon cancer admitted for management of progressive volume overload.  Patient s/p Hemodialysis on 11/18. Pt. had meal tray in front of her at time of assessment- had only eaten fruit cup and was uninterested in the rest of the meal. States that she doesn't eat much at home and usually consumes one meal per day which consists of salad with cheese and ham on top. Lives with grandson at home, pt does most of the cooking for herself. Understands that she should stay away from using the salt shaker and states that she weighs herself daily in order to monitor her fluid retention. Pt verbalized understanding the importance of maintaining good calorie and protein intake. Endorsed she would be interested in taking a supplement when she does not have a good appetite, was educated on Nepro and Prostat. States her UBW is "around 230 pounds." Upon review of wt history, patient was +23# from reported UBW PTA and is -28# from admit wt. Labs reviewed: CBG 110-170; Sodium 140; Potassium 3.9; A1C 7.2;  Magnesium 2.1 Medications reviewed: Insulin novalog 0-9 units; novolog mix 14  unit injection; Albumin; Lasix  NUTRITION - FOCUSED PHYSICAL EXAM:    Most Recent Value  Orbital Region  No depletion  Upper Arm Region  No depletion  Thoracic and Lumbar Region  No depletion  Buccal Region  No depletion  Temple Region  Mild depletion  Clavicle Bone Region  No depletion  Clavicle and Acromion Bone Region  No depletion  Scapular Bone Region  No depletion  Dorsal Hand  Mild depletion  Patellar Region  Mild depletion  Anterior Thigh Region  Mild depletion  Posterior Calf Region  No depletion  Edema (RD Assessment)  Mild  Hair  Reviewed  Eyes  Reviewed  Mouth  Reviewed  Skin  Reviewed  Nails  Reviewed       Diet Order:   Diet Order            Diet renal/carb modified with fluid restriction Diet-HS Snack? Nothing; Fluid restriction: 1200 mL Fluid; Room service appropriate? Yes; Fluid consistency: Thin  Diet effective now              EDUCATION NEEDS:   Education needs have been addressed  Skin:  Skin Assessment: Reviewed RN Assessment  Last BM:  11/17  Height:   Ht Readings from Last 1 Encounters:  04/10/19 5\' 4"  (1.626 m)    Weight:   Wt Readings from Last 1 Encounters:  04/16/19 99.8 kg    Ideal Body Weight:  54.5 kg  BMI:  Body mass index is 37.77 kg/m.  Estimated Nutritional Needs:   Kcal:  1700-1900 kcal  Protein:  85-95 grams  Fluid:  </=  1.[place], Dietetic Intern

## 2019-04-16 NOTE — Progress Notes (Signed)
MD notified: Priscilla Villanueva the patient indicates she has not taken spironolactone in 3 months as it has not been working on her. She indicates her primary care provider had taking her off it.

## 2019-04-16 NOTE — TOC Progression Note (Signed)
Transition of Care [facility]) - Progression Note    Patient Details  Name: Priscilla Villanueva MRN: [id number] Date of Birth: 1950-12-18  Transition of Care [facility]) CM/SW [place], [place] Phone Number: 04/16/2019, 2:26 PM  Clinical Narrative:     Confirmed that patient has chronic nocturnal home 02 through [place] Patient. Patient only has home concentrator. Patient currently requiring acute continuous 02.   Expected Discharge Plan: [place] Barriers to Discharge: No Barriers Identified  Expected Discharge Plan and Services Expected Discharge Plan: [place] In-house Referral: Clinical Social Work   Post Acute Care Choice: [place] arrangements for the past 2 months: Single Family Home                 DME Arranged: N/A         HH Arranged: NA(None at this time)           Social Determinants of Health (SDOH) Interventions    Readmission Risk Interventions Readmission Risk Prevention Plan 04/15/2019  Transportation Screening Complete  PCP or Specialist Appt within 3-5 Days Not Complete  Not Complete comments Patient has appointment with PCP 11/30  [place] or Home Care Consult Complete  Social Work Consult for [place] Planning/Counseling [place] Screening Not Applicable  Medication Review Press photographer) Referral to Pharmacy  Some recent data might be hidden

## 2019-04-16 NOTE — Progress Notes (Signed)
[facility]  Daily Progress Note   Subjective  - 5 Days Post-Temp Cath placement  I am asked to come reevaluate the patient secondary to bleeding from the temporary catheter site.  The patient has now undergone successful dialysis for stabilizing her volume status.  The temporary catheter has been removed.  Pressure was held not once but twice for a significant period of time approximately 20 minutes or so.  The patient has been maintained supine in bed.  Objective Vitals:   04/16/19 1230 04/16/19 1320 04/16/19 1426 04/16/19 1639  BP: (!) 195/81 (!) 203/80 (!) 145/70 (!) 154/80  Pulse: 88 92 86 82  Resp: (!) 26 18 20 20   Temp: 98.5 F (36.9 C) 98.8 F (37.1 C) 99.2 F (37.3 C) 98.6 F (37 C)  TempSrc: Oral Oral Oral Oral  SpO2: 100% 95% 95% 99%  Weight: 99.8 kg     Height:        Intake/Output Summary (Last 24 hours) at 04/16/2019 1905 Last data filed at 04/16/2019 1714 Gross per 24 hour  Intake 11.32 ml  Output 6666 ml  Net -6654.68 ml    PULM  Normal effort , no use of accessory muscles CV  No JVD, RRR Abd      No distended, nontender VASC  right groin is clean and intact no significant hematomas noted.  With a small amount of manipulation oozing of blood is identified.  Laboratory CBC    Component Value Date/Time   WBC 4.5 04/15/2019 0542   HGB 10.3 (L) 04/15/2019 0542   HGB 11.6 (L) 04/13/2014 1540   HCT 33.3 (L) 04/15/2019 0542   HCT 39.2 12/08/2013 1437   PLT 134 (L) 04/15/2019 0542   PLT 169 12/08/2013 1437    BMET    Component Value Date/Time   NA 140 04/16/2019 0532   NA 134 (L) 05/28/2013 0825   K 3.9 04/16/2019 0532   K 3.5 05/28/2013 0825   CL 97 (L) 04/16/2019 0532   CL 99 05/28/2013 0825   CO2 24 04/16/2019 0532   CO2 25 05/28/2013 0825   GLUCOSE 206 (H) 04/16/2019 0532   GLUCOSE 222 (H) 05/28/2013 0825   BUN 21 04/16/2019 0532   BUN 30 (H) 05/28/2013 0825   CREATININE 1.51 (H) 04/16/2019 0532   CREATININE 1.40  (H) 05/28/2013 0825   CALCIUM 8.6 (L) 04/16/2019 0532   CALCIUM 9.2 05/28/2013 0825   GFRNONAA 36 (L) 04/16/2019 0532   GFRNONAA 41 (L) 05/28/2013 0825   GFRAA 41 (L) 04/16/2019 0532   GFRAA 47 (L) 05/28/2013 0825    Assessment/Planning: POD #5 s/p placement of a right femoral temporary dialysis catheter   Given the difficulty that the care team has had with this particular site I felt that infiltrating the surrounding tissues with lidocaine with epinephrine and then placing a safeguard was appropriate.  Orders are in for removing the safeguard tomorrow morning.  If the patient is felt ready for discharge tomorrow from a vascular standpoint this is okay I would not anticipate any further bleeding after these measures.    Hortencia Pilar  04/16/2019, 7:05 PM

## 2019-04-17 DIAGNOSIS — I272 Pulmonary hypertension, unspecified: Secondary | ICD-10-CM | POA: Diagnosis present

## 2019-04-17 DIAGNOSIS — Z794 Long term (current) use of insulin: Secondary | ICD-10-CM

## 2019-04-17 DIAGNOSIS — E1121 Type 2 diabetes mellitus with diabetic nephropathy: Secondary | ICD-10-CM

## 2019-04-17 LAB — CBC
HCT: 33.3 % — ABNORMAL LOW (ref 36.0–46.0)
Hemoglobin: 10.3 g/dL — ABNORMAL LOW (ref 12.0–15.0)
MCH: 25.1 pg — ABNORMAL LOW (ref 26.0–34.0)
MCHC: 30.9 g/dL (ref 30.0–36.0)
MCV: 81 fL (ref 80.0–100.0)
Platelets: 145 10*3/uL — ABNORMAL LOW (ref 150–400)
RBC: 4.11 MIL/uL (ref 3.87–5.11)
RDW: 18.5 % — ABNORMAL HIGH (ref 11.5–15.5)
WBC: 6.1 10*3/uL (ref 4.0–10.5)
nRBC: 0 % (ref 0.0–0.2)

## 2019-04-17 LAB — BASIC METABOLIC PANEL
Anion gap: 13 (ref 5–15)
BUN: 24 mg/dL — ABNORMAL HIGH (ref 8–23)
CO2: 26 mmol/L (ref 22–32)
Calcium: 8.6 mg/dL — ABNORMAL LOW (ref 8.9–10.3)
Chloride: 100 mmol/L (ref 98–111)
Creatinine, Ser: 1.56 mg/dL — ABNORMAL HIGH (ref 0.44–1.00)
GFR calc Af Amer: 40 mL/min — ABNORMAL LOW (ref >60)
GFR calc non Af Amer: 34 mL/min — ABNORMAL LOW (ref >60)
Glucose, Bld: 261 mg/dL — ABNORMAL HIGH (ref 70–99)
Potassium: 3.9 mmol/L (ref 3.5–5.1)
Sodium: 139 mmol/L (ref 135–145)

## 2019-04-17 LAB — GLUCOSE, CAPILLARY
Glucose-Capillary: 234 mg/dL — ABNORMAL HIGH (ref 70–99)
Glucose-Capillary: 265 mg/dL — ABNORMAL HIGH (ref 70–99)
Glucose-Capillary: 247 mg/dL — ABNORMAL HIGH (ref 70–99)

## 2019-04-17 MED ORDER — POTASSIUM CHLORIDE ER 10 MEQ PO TBCR: 10.0000 meq | EXTENDED_RELEASE_TABLET | Freq: Every day | ORAL | 0 refills | Status: AC

## 2019-04-17 MED ORDER — TORSEMIDE 20 MG PO TABS: 40.0000 mg | ORAL_TABLET | Freq: Every day | ORAL | 1 refills | Status: AC

## 2019-04-17 MED ORDER — POTASSIUM CHLORIDE CRYS ER 20 MEQ PO TBCR
20.0000 meq | EXTENDED_RELEASE_TABLET | Freq: Once | ORAL | Status: AC
  Administered 2019-04-17: 20 meq via ORAL
  Filled 2019-04-17: qty 1

## 2019-04-17 MED ORDER — RENA-VITE PO TABS: 1.0000 | ORAL_TABLET | Freq: Every day | ORAL | 0 refills | Status: AC

## 2019-04-17 NOTE — Progress Notes (Signed)
[facility]  ROUNDING NOTE   Subjective:   Furosemide gtt 5mg /hr Wt 99.8 kg ( 112.5kg on admission) 11/18 0701 - 11/19 0700 In: 747.7 [I.V.:708; IV Piggyback:39.7] Out: 3662 [Urine:900]  Length of stay: 7 days  Overall feeling fine.  Has bleeding from right femoral catheter site- stopped now Edema has improved significantly Down to 99.7kg   Objective:  Vital signs in last 24 hours:  Temp:  [98.3 F (36.8 C)-99.2 F (37.3 C)] 98.3 F (36.8 C) (11/19 1044) Pulse Rate:  [73-92] 75 (11/19 1044) Resp:  [17-26] 18 (11/19 1044) BP: (122-203)/(65-102) 150/74 (11/19 1044) SpO2:  [92 %-100 %] 92 % (11/19 1044) Weight:  [99.7 kg-99.8 kg] 99.7 kg (11/19 0437)  Weight change: -0.9 kg Filed Weights   04/16/19 0915 04/16/19 1230 04/17/19 0437  Weight: 102.7 kg 99.8 kg 99.7 kg    Intake/Output: I/O last 3 completed shifts: In: 747.7 [I.V.:708; IV Piggyback:39.7] Out: 6666 [Urine:900; Other:5766]   Intake/Output this shift:  Total I/O In: -  Out: 800 [Urine:800]  Physical Exam: General: NAD,   Head: Normocephalic, atraumatic. Moist oral mucosal membranes  Eyes: Anicteric,   Neck: Supple,   Lungs:   clear to   Heart: Regular rate and rhythm  Abdomen:  abdominal wall edema   Extremities: + peripheral edema upto thighs  Neurologic:  Alert and oriented  Skin: No lesions  Access: Right femoral temp catheter 11/14 Dr. Lucky Cowboy    Basic Metabolic Panel: Recent Labs  Lab 04/11/19 0354 04/12/19 8295 04/13/19 6213 04/14/19 0557 04/15/19 0542 04/16/19 0532 04/17/19 0538  NA 145 142  143 140 141 138 140 139  K 3.7 3.5  3.5 3.4* 3.2* 3.3* 3.9 3.9  CL 109 106  107 104 104 104 97* 100  CO2 28 27  27 27 28 25 24 26   GLUCOSE 206* 131*  132* 190* 155* 120* 206* 261*  BUN 21 18  17 16 19 19 21  24*  CREATININE 1.38* 1.39*  1.42* 1.56* 1.51* 1.49* 1.51* 1.56*  CALCIUM 8.3* 8.2*  8.3* 8.1* 8.2* 8.2* 8.6* 8.6*  MG 1.8  --   --  1.8 2.1  --   --   PHOS  --  4.0   --   --   --   --   --     Liver Function Tests: Recent Labs  Lab 04/12/19 0833  ALBUMIN 2.6*   No results for input(s): LIPASE, AMYLASE in the last 168 hours. No results for input(s): AMMONIA in the last 168 hours.  CBC: Recent Labs  Lab 04/11/19 0354 04/12/19 0833 04/14/19 0557 04/15/19 0542 04/17/19 0538  WBC 5.1 4.7 4.7 4.5 6.1  NEUTROABS  --  3.2  --  3.0  --   HGB 10.0* 10.2* 10.2* 10.3* 10.3*  HCT 34.2* 33.9* 33.6* 33.3* 33.3*  MCV 85.3 82.3 83.2 81.0 81.0  PLT 153 142* 132* 134* 145*    Cardiac Enzymes: No results for input(s): CKTOTAL, CKMB, CKMBINDEX, TROPONINI in the last 168 hours.  BNP: Invalid input(s): POCBNP  CBG: Recent Labs  Lab 04/16/19 0759 04/16/19 1328 04/16/19 1612 04/16/19 2138 04/17/19 0728  GLUCAP 169* 178* 218* 299* 234*    Microbiology: Results for orders placed or performed during the hospital encounter of 04/10/19  SARS CORONAVIRUS 2 (TAT 6-24 HRS) Nasopharyngeal Nasopharyngeal Swab     Status: None   Collection Time: 04/10/19  9:14 PM   Specimen: Nasopharyngeal Swab  Result Value Ref Range Status   SARS Coronavirus  2 NEGATIVE NEGATIVE Final    Comment: (NOTE) SARS-CoV-2 target nucleic acids are NOT DETECTED. The SARS-CoV-2 RNA is generally detectable in upper and lower respiratory specimens during the acute phase of infection. Negative results do not preclude SARS-CoV-2 infection, do not rule out co-infections with other pathogens, and should not be used as the sole basis for treatment or other patient management decisions. Negative results must be combined with clinical observations, patient history, and epidemiological information. The expected result is Negative. Fact Sheet for Patients: SugarRoll.be Fact Sheet for Healthcare Providers: https://www.woods-mathews.com/ This test is not yet approved or cleared by the [place] FDA and  has been authorized for detection and/or  diagnosis of SARS-CoV-2 by FDA under an Emergency Use Authorization (EUA). This EUA will remain  in effect (meaning this test can be used) for the duration of the COVID-19 declaration under Section 56 4(b)(1) of the Act, 21 U.S.C. section 360bbb-3(b)(1), unless the authorization is terminated or revoked sooner. Performed at [facility], [place] [address] [place], [place] [address]     Coagulation Studies: No results for input(s): LABPROT, INR in the last 72 hours.  Urinalysis: No results for input(s): COLORURINE, LABSPEC, PHURINE, GLUCOSEU, HGBUR, BILIRUBINUR, KETONESUR, PROTEINUR, UROBILINOGEN, NITRITE, LEUKOCYTESUR in the last 72 hours.  Invalid input(s): APPERANCEUR    Imaging: No results found.   Medications:   . furosemide (LASIX) infusion 5 mg/hr (04/15/19 1701)   . aspirin EC  81 mg Oral QPM  . atorvastatin  40 mg Oral QPM  . Chlorhexidine Gluconate Cloth  6 each Topical Q0600  . cloNIDine  0.3 mg Oral TID  . feeding supplement (NEPRO CARB STEADY)  237 mL Oral BID BM  . hydrALAZINE  25 mg Oral TID  . insulin aspart  0-5 Units Subcutaneous QHS  . insulin aspart  0-9 Units Subcutaneous TID WC  . insulin aspart protamine- aspart  14 Units Subcutaneous BID WC  . metoprolol  200 mg Oral Daily  . multivitamin  1 tablet Oral QHS  . pantoprazole  40 mg Oral QAC supper  . sodium chloride flush  3 mL Intravenous Q12H  . spironolactone  25 mg Oral Daily   acetaminophen **OR** acetaminophen, ALPRAZolam, bisacodyl, ondansetron **OR** ondansetron (ZOFRAN) IV, polyethylene glycol  Assessment/ Plan:  [name] is a 68 y.o. black female with hypertension, diabetes mellitus type II, hyperlipidemia, depression, history of colon cancer, coronary artery disease, congestive heart failure with diastolic disfunction, right rotator cuff trauma, hyperaldosteronism who is admitted from nephrology clinic on 04/10/2019 for Volume overload AKI CKD level 3  1. 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  Chronic kidney disease secondary to diabetic nephropathy Acute renal failure due to acute cardiorenal syndrome.    Oozing from dialysis catheter- no heparin is being used during dialysis.  Likely high-pressure from right ventricular dysfunction. -Managed with pressure dressing.      2. LE Edema and Volume overload. Failed outpatient diuretic therapy. Last 2 D Echo (03/11/2019) = LVEF 50-55 %; LVH; Severe Pulmonary Hypertension Now getting iv furosemide and UF only dialysis for volume removal Pulmonary HTN- Dr Duwayne Heck recommends evaluation at [facility] Ultrafiltration goal 2.5 - 3 L  cc -18 L so far this admission Will follow up as outpatient next week  3. Hypertension: with chronic kidney disease:   With volume removal, blood pressure is expected to get better   4. Diabetes mellitus type II  with chronic kidney disease: insulin dependent. Hemoglobin A1c of 7.2% on 03/11/2019 - holding metformin  5.  Hypokalemia -Potassium supplementation -continue spironolactone 25 mg daily     LOS: 7 Jeriah Corkum 11/19/202011:17 AM

## 2019-04-17 NOTE — Progress Notes (Signed)
Patient's right groin where the PAD was placed after removal of HD catheter, is dry and intact with +2 palable pulse in femoral region. Will continue to monitor.  Priscilla Villanueva

## 2019-04-17 NOTE — Progress Notes (Signed)
Hospitalist and nephrology notified: Dr. Candiss Norse from your stand is this patient ready for this charge. Dr. Blaine Hamper wanted me to touch base with you. The patient is still on a lasix drip. Dr. Blaine Hamper was thinking about possibly switching the patient to oral lasix and discontinuing the IV lasix. GIve the patient another hospital day for monitoring just to make sure she did not have any further complications with the change of the medication.

## 2019-04-17 NOTE — Care Management Important Message (Signed)
Important Message  Patient Details  Name: Priscilla Villanueva MRN: [id number] Date of Birth: 1950-10-23   Medicare Important Message Given:  Yes     [name] 04/17/2019, 1:28 PM

## 2019-04-17 NOTE — Progress Notes (Signed)
The patient has been discharged. Education completed. IV removed.

## 2019-04-17 NOTE — Progress Notes (Signed)
SATURATION QUALIFICATIONS: (This note is used to comply with regulatory documentation for home oxygen)  Patient Saturations on Room Air at Rest = 95%  Patient Saturations on Room Air while Ambulating = 92%  Patient Saturations on 0 Liters of oxygen while Ambulating = 92%  Please briefly explain why patient needs home oxygen: 

## 2019-04-17 NOTE — Consult Note (Signed)
PHARMACY CONSULT NOTE  Pharmacy Consult for Electrolyte Monitoring and Replacement   Recent Labs: Potassium (mmol/L)  Date Value  04/17/2019 3.9  05/28/2013 3.5   Magnesium (mg/dL)  Date Value  04/15/2019 2.1  05/27/2013 1.9   Calcium (mg/dL)  Date Value  04/17/2019 8.6 (L)   Calcium, Total (mg/dL)  Date Value  05/28/2013 9.2   Albumin (g/dL)  Date Value  04/12/2019 2.6 (L)  06/23/2013 3.4   Phosphorus (mg/dL)  Date Value  04/12/2019 4.0   Sodium (mmol/L)  Date Value  04/17/2019 139  05/28/2013 134 (L)   Corrected Ca: 9.72 mg/dL  Assessment: 68 year old female with a past medical history notable for CKD stage III, chronic diastolic congestive heart failure, moderate severe pulmonary hypertension, insulin-dependent diabetes, hypertension, hyperlipidemia and CAD as well as history of colon cancer.  She is on a furosemide infusion running at 5 mg/hr   Goal of Therapy:  Potassium 4.0 - 5.1 mmol/L Magnesium 2.0 - 2.4 mg/dL Other Electrolytes WNL  Plan:   Patient on Lasix drip and Spironolactone was restarted 11/16   Will order KCL 20 meq PO x1 again today  Re-check electrolytes in am  Noralee Space ,PharmD Clinical Pharmacist 04/17/2019 8:04 AM

## 2019-04-17 NOTE — TOC Transition Note (Signed)
Transition of Care [facility]) - CM/SW Discharge Note   Patient Details  Name: Priscilla Villanueva MRN: [id number] Date of Birth: [birth year]  [facility] CM/SW Contact:  Beverly Sessions, RN Phone Number: 04/17/2019, 4:01 PM   Clinical Narrative:    Patient states she has a rollator, and declines RW at discharge    Final next level of care: [place] Barriers to Discharge: No Barriers Identified   Patient Goals and CMS Choice Patient states their goals for this hospitalization and ongoing recovery are:: "getting back home" CMS Medicare.gov Compare Post Acute Care list provided to:: Patient Choice offered to / list presented to : Patient  Discharge Placement                       Discharge Plan and Services In-house Referral: Clinical Social Work   Post Acute Care Choice: Home Health          DME Arranged: N/A         HH Arranged: RN, PT [place] Agency: [place] Date [place]: 04/17/19 Time [place]: 1544 Representative spoke with at [place]: [place] Determinants of Health ([place]) Interventions     Readmission Risk Interventions Readmission Risk Prevention Plan 04/15/2019  Transportation Screening Complete  PCP or Specialist Appt within 3-5 Days Not Complete  Not Complete comments Patient has appointment with PCP 11/30  [place] or Home Care Consult Complete  Social Work Consult for [place] Planning/Counseling [place] Screening Not Applicable  Medication Review Press photographer) Referral to Pharmacy  Some recent data might be hidden

## 2019-04-17 NOTE — Plan of Care (Signed)
  Problem: Education: Goal: Knowledge of General Education information will improve Description: Including pain rating scale, medication(s)/side effects and non-pharmacologic comfort measures Outcome: Completed/Met   Problem: Health Behavior/Discharge Planning: Goal: Ability to manage health-related needs will improve Outcome: Completed/Met   Problem: Clinical Measurements: Goal: Ability to maintain clinical measurements within normal limits will improve Outcome: Completed/Met Goal: Will remain free from infection Outcome: Completed/Met Goal: Diagnostic test results will improve Outcome: Completed/Met Goal: Respiratory complications will improve Outcome: Completed/Met Goal: Cardiovascular complication will be avoided Outcome: Completed/Met   Problem: Activity: Goal: Risk for activity intolerance will decrease Outcome: Completed/Met   Problem: Nutrition: Goal: Adequate nutrition will be maintained Outcome: Completed/Met   Problem: Coping: Goal: Level of anxiety will decrease Outcome: Completed/Met   Problem: Elimination: Goal: Will not experience complications related to bowel motility Outcome: Completed/Met Goal: Will not experience complications related to urinary retention Outcome: Completed/Met   Problem: Elimination: Goal: Will not experience complications related to bowel motility Outcome: Completed/Met Goal: Will not experience complications related to urinary retention Outcome: Completed/Met   Problem: Pain Managment: Goal: General experience of comfort will improve Outcome: Completed/Met   Problem: Safety: Goal: Ability to remain free from injury will improve Outcome: Completed/Met   Problem: Skin Integrity: Goal: Risk for impaired skin integrity will decrease Outcome: Completed/Met   Problem: Inadequate Protein-Energy Intake  (NI-5.3) Goal: Food and/or nutrient delivery Description: Individualized approach for food/nutrient provision. Outcome:  Completed/Met   Problem: Acute Rehab PT Goals(only PT should resolve) Goal: Pt Will Go Supine/Side To Sit Outcome: Completed/Met Goal: Pt Will Transfer Bed To Chair/Chair To Bed Outcome: Completed/Met Goal: Pt Will Ambulate Outcome: Completed/Met Goal: Pt Will Go Up/Down Stairs Outcome: Completed/Met

## 2019-04-17 NOTE — Discharge Summary (Addendum)
Physician Discharge Summary  Priscilla Villanueva:[id number] DOB: 1951/02/14 DOA: 04/10/2019  PCP: Tracie Harrier, MD  Admit date: 04/10/2019 Discharge date: 04/17/2019  Recommendations for Outpatient Follow-up:  1. Follow up with PCP in 1 weak. Please follow up with nephrologist, pulmonologist, cardiologist. Please call [facility] for pulmonary clinic appointment. 2. Please obtain BMP/CBC in one week   Home Health: HH PT, RN Equipment/Devices: rolling walker 5''  Discharge Condition: stable  CODE STATUS: full Diet recommendation:   Brief/Interim Summary (HPI)  Priscilla Villanueva is a 68 y.o. female with medical history significant for CKD stage III, chronic diastolic CHF, mod-severe pulmonary hypertension, insulin-dependent type 2 diabetes, hypertension, hyperlipidemia, CAD, and history of colon cancer who is a direct admission per nephrology for further management of progressive volume overload.  Patient reports about 40 pound progressive weight gain over the last month despite home diuretics.  She has been having increased swelling in her legs and abdomen.  She has been having progressive shortness of breath with exertion and at rest as well as orthopnea.  She reports occasional cough productive of slightly yellow sputum.  She denies any chest pain, palpitations, fevers, chills, diaphoresis, abdominal pain, dysuria, or diarrhea.  She was seen by her nephrologist Dr. Candiss Norse on 04/09/2019 who subsequently recommended direct admission to the hospital for management of progressive volume overload with ultrafiltration dialysis.  Subjective  Patient reports generalized weakness, no chest pain, shortness of breath, cough.  No GI symptoms.  No symptoms of UTI.   Discharge Diagnoses and Hospital Course:   Principal Problem:   Volume overload Active Problems:   Chronic diastolic CHF (congestive heart failure) (HCC)   Type II diabetes mellitus with renal manifestations (HCC)    Hypertension associated with diabetes ([place])   Hyperlipidemia associated with type 2 diabetes mellitus (HCC)   CKD (chronic kidney disease), stage III   Pulmonary hypertension (HCC)   Hypervolemiawith chronic diastolic heart failure moderate severe pulmonary hypertension: pt was treated with lasix gtt 5mg /hr.  Renal was consulted, pt had ultrafiltration. Her volume status improved significantly. Dialysis catheter placement by vascular surgery11/13, and her Right femoral catheter was removed before discharge. Pt has oozing from catheter site and treated with pressure device. Bleeding stopped. Dr. Candiss Norse of nephrology recommended to start pt with 40 mg of Torsemide daily at discharge and follow up with renal at 04/22/19. PT was consulted, recommended HH PT and rolling walker.  CKD stage IIIa: -volume management per renal -Oozing at the dialysis catheter site, was seen by vascular surgery. Bleeding stopped.  Insulin-dependent diabetes type 2: -continue home Victoza and 75/25  Hypertension: -Continue patient's clonidine, hydralazine, Toprol -Holding ARB, benicar  Hyperlipidemia: -Continue statin  Severe pulmonary hypertension:  Pulmonary artery obstruction has been ruled out by low probability VQ scan 03/11/19, this is the preferred study for chronic pulmonary embolism. Pulmonology was consulted, Dr. Patsey Berthold evaluated pt.  She recommended: Supplemental oxygen at 2 L/min, 24/7; she should have ambulatory oximetry performed on room air prior to discharge; Endorse referral to the [facility] for Pulmonary Vascular Disease so that the appropriate pulmonary vasodilator can be instituted.  Additionally, once temporary catheter for dialysis has been removed recommend that the patient be placed on anticoagulation as patients with severe pulmonary hypertension are prone to pulmonary embolism. Since pt has oozing and bleeding after her right femoral catheter is removed, will not start anticoagulants  at discharge. Pt's resting O2 sat 95% and ambulating O2 sat 92%.     Discharge Instructions  Allergies as of 04/17/2019      Reactions   Amlodipine Swelling   Causes pt's feet and legs to swell...   Hydrochlorothiazide Other (See Comments)   Headaches.   Lisinopril Swelling, Cough   Sitagliptin Diarrhea   Zoloft [sertraline] Other (See Comments)   Unsure of exact reaction      Medication List    STOP taking these medications   olmesartan 40 MG tablet Commonly known as: BENICAR     TAKE these medications   allopurinol 300 MG tablet Commonly known as: ZYLOPRIM Take 300 mg by mouth daily.   ALPRAZolam 0.25 MG tablet Commonly known as: XANAX Take 0.25 mg by mouth at bedtime as needed for sleep.   aspirin 81 MG EC tablet Take 81 mg by mouth every evening. Swallow whole.   atorvastatin 40 MG tablet Commonly known as: LIPITOR Take 40 mg by mouth every evening.   b complex vitamins capsule Take 1 capsule by mouth daily.   cloNIDine 0.3 MG tablet Commonly known as: CATAPRES Take 0.3 mg by mouth 3 (three) times daily.   colchicine 0.6 MG tablet Take 1.2 mg by mouth daily as needed (gout flare ups).   CoQ10 100 MG Caps Take 100 mg by mouth every evening.   HumaLOG Mix 75/25 KwikPen (75-25) 100 UNIT/ML Kwikpen Generic drug: Insulin Lispro Prot & Lispro Inject 28 Units into the skin 2 (two) times daily.   hydrALAZINE 100 MG tablet Commonly known as: APRESOLINE Take 100 mg by mouth 3 (three) times daily.   liraglutide 18 MG/3ML Sopn Commonly known as: VICTOZA Inject 0.6 mg into the skin daily.   metoprolol 200 MG 24 hr tablet Commonly known as: TOPROL-XL Take 200 mg by mouth daily.   multivitamin Tabs tablet Take 1 tablet by mouth at bedtime.   olopatadine 0.1 % ophthalmic solution Commonly known as: PATANOL Place 1 drop into both eyes 2 (two) times daily as needed for allergies.   polyethylene glycol 17 g packet Commonly known as: MIRALAX /  GLYCOLAX Take 17 g by mouth daily as needed (constipation.).   potassium chloride 10 MEQ tablet Commonly known as: KLOR-CON Take 1 tablet (10 mEq total) by mouth daily. What changed: when to take this   torsemide 20 MG tablet Commonly known as: DEMADEX Take 2 tablets (40 mg total) by mouth daily. What changed: how much to take      Follow-up Information    [facility] Follow up on 04/23/2019.   Specialty: Cardiology Why: at 2:30pm. Please enter through the [facility] entrance Contact information: [facility] [place] [place] [phone]       Murlean Iba, MD Follow up on 04/22/2019.   Specialty: Nephrology Contact information: [place] [address] [phone]        Tyler Pita, MD Follow up in 1 week(s).   Specialty: Pulmonary Disease Contact information: [place] [address] [place] [address] [place] [place] Follow up in 3 week(s).   Specialty: General Practice Contact information: [place] [address] [phone]        Tracie Harrier, MD Follow up in 1 week(s).   Specialty: Internal Medicine Contact information: [address] [place] [address] [phone]          Allergies  Allergen Reactions  . Amlodipine Swelling    Causes  pt's feet and legs to swell...  . Hydrochlorothiazide Other (See Comments)    Headaches.  . Lisinopril Swelling and Cough  . Sitagliptin Diarrhea  . Zoloft [Sertraline] Other (See Comments)    Unsure of exact reaction    Consultations:  VVS  renal   Procedures/Studies: Dg Chest 2 View  Result Date: 04/11/2019 CLINICAL DATA:  Pulmonary edema EXAM: CHEST - 2 VIEW COMPARISON:  03/11/2019 FINDINGS: Cardiomegaly. Mild, diffuse bilateral interstitial pulmonary opacity and trace pleural effusions. The  visualized skeletal structures are unremarkable. IMPRESSION: 1. Mild, diffuse bilateral interstitial pulmonary opacity and trace pleural effusions, findings consistent with edema. No focal airspace opacity. 2.  Cardiomegaly. Electronically Signed   By: Eddie Candle M.D.   On: 04/11/2019 12:51      Discharge Exam: Vitals:   04/17/19 1044 04/17/19 1207  BP: (!) 150/74 139/60  Pulse: 75 80  Resp: 18 20  Temp: 98.3 F (36.8 C) 98.9 F (37.2 C)  SpO2: 92% 97%   Vitals:   04/17/19 0609 04/17/19 1032 04/17/19 1044 04/17/19 1207  BP: (!) 143/65  (!) 150/74 139/60  Pulse: 73  75 80  Resp: 17  18 20   Temp: 98.6 F (37 C)  98.3 F (36.8 C) 98.9 F (37.2 C)  TempSrc: Oral  Oral Oral  SpO2: 92% 92% 92% 97%  Weight:      Height:        General: Pt is alert, awake, not in acute distress Cardiovascular: RRR, S1/S2 +, no rubs, no gallops Respiratory: CTA bilaterally, no wheezing, no rhonchi Abdominal: Soft, NT, ND, bowel sounds + Extremities: no edema, no cyanosis    The results of significant diagnostics from this hospitalization (including imaging, microbiology, ancillary and laboratory) are listed below for reference.     Microbiology: Recent Results (from the past 240 hour(s))  SARS CORONAVIRUS 2 (TAT 6-24 HRS) Nasopharyngeal Nasopharyngeal Swab     Status: None   Collection Time: 04/10/19  9:14 PM   Specimen: Nasopharyngeal Swab  Result Value Ref Range Status   SARS Coronavirus 2 NEGATIVE NEGATIVE Final    Comment: (NOTE) SARS-CoV-2 target nucleic acids are NOT DETECTED. The SARS-CoV-2 RNA is generally detectable in upper and lower respiratory specimens during the acute phase of infection. Negative results do not preclude SARS-CoV-2 infection, do not rule out co-infections with other pathogens, and should not be used as the sole basis for treatment or other patient management decisions. Negative results must be combined with clinical observations, patient history, and  epidemiological information. The expected result is Negative. Fact Sheet for Patients: SugarRoll.be Fact Sheet for Healthcare Providers: https://www.woods-mathews.com/ This test is not yet approved or cleared by the [place] FDA and  has been authorized for detection and/or diagnosis of SARS-CoV-2 by FDA under an Emergency Use Authorization (EUA). This EUA will remain  in effect (meaning this test can be used) for the duration of the COVID-19 declaration under Section 56 4(b)(1) of the Act, 21 U.S.C. section 360bbb-3(b)(1), unless the authorization is terminated or revoked sooner. Performed at [facility], [place] [address], [place], [place] [address]      Labs: BNP (last 3 results) Recent Labs    03/11/19 0258 03/20/19 1135  BNP 1,048.0* 323.5*   Basic Metabolic Panel: Recent Labs  Lab 04/11/19 0354 04/12/19 0833 04/13/19 0538 04/14/19 0557 04/15/19 0542 04/16/19 0532 04/17/19 0538  NA 145 142  143 140 141 138 140 139  K 3.7 3.5  3.5 3.4* 3.2* 3.3* 3.9 3.9  CL 109 106  107 104 104 104 97* 100  CO2 28 27  27 27 28 25 24 26   GLUCOSE 206* 131*  132* 190* 155* 120* 206* 261*  BUN 21 18  17 16 19 19 21  24*  CREATININE 1.38* 1.39*  1.42* 1.56* 1.51* 1.49* 1.51* 1.56*  CALCIUM 8.3* 8.2*  8.3* 8.1* 8.2* 8.2* 8.6* 8.6*  MG 1.8  --   --  1.8 2.1  --   --   PHOS  --  4.0  --   --   --   --   --    Liver Function Tests: Recent Labs  Lab 04/12/19 0833  ALBUMIN 2.6*   No results for input(s): LIPASE, AMYLASE in the last 168 hours. No results for input(s): AMMONIA in the last 168 hours. CBC: Recent Labs  Lab 04/11/19 0354 04/12/19 0833 04/14/19 0557 04/15/19 0542 04/17/19 0538  WBC 5.1 4.7 4.7 4.5 6.1  NEUTROABS  --  3.2  --  3.0  --   HGB 10.0* 10.2* 10.2* 10.3* 10.3*  HCT 34.2* 33.9* 33.6* 33.3* 33.3*  MCV 85.3 82.3 83.2 81.0 81.0  PLT 153 142* 132* 134* 145*   Cardiac Enzymes: No results for  input(s): CKTOTAL, CKMB, CKMBINDEX, TROPONINI in the last 168 hours. BNP: Invalid input(s): POCBNP CBG: Recent Labs  Lab 04/16/19 1328 04/16/19 1612 04/16/19 2138 04/17/19 0728 04/17/19 1138  GLUCAP 178* 218* 299* 234* 265*   D-Dimer No results for input(s): DDIMER in the last 72 hours. Hgb A1c No results for input(s): HGBA1C in the last 72 hours. Lipid Profile No results for input(s): CHOL, HDL, LDLCALC, TRIG, CHOLHDL, LDLDIRECT in the last 72 hours. Thyroid function studies No results for input(s): TSH, T4TOTAL, T3FREE, THYROIDAB in the last 72 hours.  Invalid input(s): FREET3 Anemia work up No results for input(s): VITAMINB12, FOLATE, FERRITIN, TIBC, IRON, RETICCTPCT in the last 72 hours. Urinalysis    Component Value Date/Time   COLORURINE Yellow 05/26/2013 0346   APPEARANCEUR Clear 05/26/2013 0346   LABSPEC 1.011 05/26/2013 0346   PHURINE 8.0 05/26/2013 0346   GLUCOSEU 150 mg/dL 05/26/2013 0346   HGBUR Negative 05/26/2013 0346   BILIRUBINUR Negative 05/26/2013 0346   KETONESUR Negative 05/26/2013 0346   PROTEINUR >=500 05/26/2013 0346   NITRITE Negative 05/26/2013 0346   LEUKOCYTESUR Negative 05/26/2013 0346   Sepsis Labs Invalid input(s): PROCALCITONIN,  WBC,  LACTICIDVEN Microbiology Recent Results (from the past 240 hour(s))  SARS CORONAVIRUS 2 (TAT 6-24 HRS) Nasopharyngeal Nasopharyngeal Swab     Status: None   Collection Time: 04/10/19  9:14 PM   Specimen: Nasopharyngeal Swab  Result Value Ref Range Status   SARS Coronavirus 2 NEGATIVE NEGATIVE Final    Comment: (NOTE) SARS-CoV-2 target nucleic acids are NOT DETECTED. The SARS-CoV-2 RNA is generally detectable in upper and lower respiratory specimens during the acute phase of infection. Negative results do not preclude SARS-CoV-2 infection, do not rule out co-infections with other pathogens, and should not be used as the sole basis for treatment or other patient management decisions. Negative results  must be combined with clinical observations, patient history, and epidemiological information. The expected result is Negative. Fact Sheet for Patients: SugarRoll.be Fact Sheet for Healthcare Providers: https://www.woods-mathews.com/ This test is not yet approved or cleared by the [place] FDA and  has been authorized for detection and/or diagnosis of SARS-CoV-2 by FDA under an Emergency Use Authorization (EUA). This EUA will remain  in effect (meaning this test can be used) for  the duration of the COVID-19 declaration under Section 56 4(b)(1) of the Act, 21 U.S.C. section 360bbb-3(b)(1), unless the authorization is terminated or revoked sooner. Performed at [facility], [place] [address], [place], [place] [address]     Time coordinating discharge:  35 minutes.  SIGNED:  Ivor Costa, DO [facility] 04/17/2019, 3:23 PM Pager is on [place]  If 7PM-7AM, please contact night-coverage [email or web address] Password [email or web address]

## 2019-04-17 NOTE — Evaluation (Signed)
Physical Therapy Evaluation Patient Details Name: Priscilla Villanueva MRN: [id number] DOB: 12-08-1950 Today's Date: 04/17/2019   History of Present Illness  presented to hospital as direct admit from nephrology due to progressive edema of bilat LEs, abdomen; admitted for management of CHF, mod-severe pulmonary hypertension.  Underwent several sessions of temporary dialysis via R temp fem cath (removed 11/18)  Clinical Impression  Upon evaluation, patient alert and oriented; follows commands and demonstrates good effort with mobility tasks.  Eager for OOB tasks as able.  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  Able to complete bed mobility with min assist; sit/stand, basic transfers and gait (100') with RW, cga/min assist.  Demonstrates slow and deliberate gait performance with decreased step height/length; minimal trunk rotation and arm swing; limited dynamic balance reactions.  Does require 2 standing rest periods to complete distance due to endurance deficits; sats >91-92% on RA throughout. Would benefit from skilled PT to address above deficits and promote optimal return to PLOF.;Recommend transition to HHPT upon discharge from acute hospitalization.     Follow Up Recommendations Home health PT    Equipment Recommendations  Rolling walker with 5" wheels    Recommendations for Other Services       Precautions / Restrictions Precautions Precautions: Fall Restrictions Weight Bearing Restrictions: No      Mobility  Bed Mobility Overal bed mobility: Needs Assistance Bed Mobility: Supine to Sit     Supine to sit: Min assist        Transfers Overall transfer level: Needs assistance Equipment used: Rolling walker (2 wheeled) Transfers: Sit to/from Stand Sit to Stand: Min assist         General transfer comment: cuing for hand placement, assist for lift off  Ambulation/Gait Ambulation/Gait assistance: Min assist Gait Distance (Feet): 100  Feet Assistive device: Rolling walker (2 wheeled)       General Gait Details: slow and deliberate with decreased step height/length; minimal trunk rotation and arm swing; limited dynamic balance reactions.  Does require 2 standing rest periods to complete distance due to endurance deficits; sats >91-92% on RA throughout.  Stairs            Wheelchair Mobility    Modified Rankin (Stroke Patients Only)       Balance Overall balance assessment: Needs assistance Sitting-balance support: No upper extremity supported;Feet supported Sitting balance-Leahy Scale: Good     Standing balance support: Bilateral upper extremity supported Standing balance-Leahy Scale: Fair                               Pertinent Vitals/Pain Pain Assessment: No/denies pain    Home Living Family/patient expects to be discharged to:: Private residence Living Arrangements: (grandson-works outside of the home) Available Help at Discharge: Family;Available PRN/intermittently Type of Home: Apartment Home Access: Stairs to enter;Elevator   Entrance Stairs-Number of Steps: full flight, bilat rails Home Layout: One level Home Equipment: Walker - 4 wheels      Prior Function Level of Independence: Independent         Comments: Indep for ADLs, household and community mobilization without assist device at baseline, recent use of 4WRW due to progressive weakness.  Does endorse at least 4 falls within previous six months.     Hand Dominance        Extremity/Trunk Assessment   Upper Extremity Assessment Upper Extremity Assessment: Overall WFL for tasks assessed(chronic R RTC injury/deficits)  Lower Extremity Assessment Lower Extremity Assessment: Overall WFL for tasks assessed(grossly 4/5 throughout bilat LEs)       Communication   Communication: No difficulties  Cognition Arousal/Alertness: Awake/alert Behavior During Therapy: WFL for tasks assessed/performed Overall  Cognitive Status: Within Functional Limits for tasks assessed                                        General Comments      Exercises Other Exercises Other Exercises: Seated LE therex, 1x10, act ROM for muscular strength/endurnace; did instruct/encourage performance outside of therapy as HEP. Patient voiced understanding and agreement.   Assessment/Plan    PT Assessment Patient needs continued PT services  PT Problem List Decreased activity tolerance;Decreased balance;Decreased mobility;Decreased coordination;Decreased knowledge of use of DME;Decreased safety awareness;Decreased knowledge of precautions;Cardiopulmonary status limiting activity       PT Treatment Interventions DME instruction;Gait training;Stair training;Functional mobility training;Therapeutic activities;Therapeutic exercise;Balance training;Patient/family education    PT Goals (Current goals can be found in the Care Plan section)  Acute Rehab PT Goals Patient Stated Goal: to return home, to get strength back PT Goal Formulation: With patient Time For Goal Achievement: 05/01/19 Potential to Achieve Goals: Good    Frequency Min 2X/week   Barriers to discharge Decreased caregiver support      Co-evaluation               AM-PAC PT "6 Clicks" Mobility  Outcome Measure Help needed turning from your back to your side while in a flat bed without using bedrails?: A Little Help needed moving from lying on your back to sitting on the side of a flat bed without using bedrails?: A Little Help needed moving to and from a bed to a chair (including a wheelchair)?: A Little Help needed standing up from a chair using your arms (e.g., wheelchair or bedside chair)?: A Little Help needed to walk in hospital room?: A Little Help needed climbing 3-5 steps with a railing? : A Little 6 Click Score: 18    End of Session Equipment Utilized During Treatment: Gait belt Activity Tolerance: Patient tolerated  treatment well Patient left: in chair;with call bell/phone within reach;with chair alarm set Nurse Communication: Mobility status PT Visit Diagnosis: Muscle weakness (generalized) (M62.81);Difficulty in walking, not elsewhere classified (R26.2)    Time: 2836-6294 PT Time Calculation (min) (ACUTE ONLY): 24 min   Charges:   PT Evaluation $PT Eval Moderate Complexity: 1 Mod PT Treatments $Therapeutic Exercise: 8-22 mins       Latavius Capizzi H. Owens Shark, PT, DPT, NCS 04/17/19, 10:43 AM [phone]

## 2019-04-17 NOTE — Discharge Instructions (Signed)
You were cared for by a hospitalist during your hospital stay. If you have any questions about your discharge medications or the care you received while you were in the hospital after you are discharged, you can call the unit and ask to speak with the hospitalist on call if the hospitalist that took care of you is not available. Once you are discharged, your primary care physician will handle any further medical issues. Please note that NO REFILLS for any discharge medications will be authorized once you are discharged, as it is imperative that you return to your primary care physician (or establish a relationship with a primary care physician if you do not have one) for your aftercare needs so that they can reassess your need for medications and monitor your lab values.  Follow up with PCP, nephrologist, cardiologist, pulmonologist in [place]. Please call [facility] for pulmonary clinic appointment. Take all medications as prescribed. If symptoms change or worsen please return to the ED for evaluation

## 2019-04-17 NOTE — TOC Transition Note (Signed)
Transition of Care [facility]) - CM/SW Discharge Note   Patient Details  Name: KADIATOU OPLINGER MRN: [id number] Date of Birth: 07/14/1950  [facility] CM/SW Contact:  Trecia Rogers, [place] Phone Number: 04/17/2019, 3:45 PM   Clinical Narrative:     Patient was admitted to the hospital from home. Patient was admitted due to volume overload. Patient will be discharging home with home health. Sarah at [facility] was contacted. [facility] will be proving PT & Nursing. Patient has oxygen at home.    Final next level of care: [place] Barriers to Discharge: No Barriers Identified   Patient Goals and CMS Choice Patient states their goals for this hospitalization and ongoing recovery are:: "getting back home" CMS Medicare.gov Compare Post Acute Care list provided to:: Patient Choice offered to / list presented to : Patient  Discharge Placement                       Discharge Plan and Services In-house Referral: Clinical Social Work   Post Acute Care Choice: Home Health          DME Arranged: N/A         HH Arranged: RN, PT [place] Agency: [place] Date [place]: 04/17/19 Time [place]: 1544 Representative spoke with at [place]: [place] Determinants of Health ([place]) Interventions     Readmission Risk Interventions Readmission Risk Prevention Plan 04/15/2019  Transportation Screening Complete  PCP or Specialist Appt within 3-5 Days Not Complete  Not Complete comments Patient has appointment with PCP 11/30  [place] or Home Care Consult Complete  Social Work Consult for [place] Planning/Counseling [place] Screening Not Applicable  Medication Review Press photographer) Referral to Pharmacy  Some recent data might be hidden

## 2019-04-18 ENCOUNTER — Other Ambulatory Visit: Payer: Self-pay | Admitting: Internal Medicine

## 2019-04-18 DIAGNOSIS — M810 Age-related osteoporosis without current pathological fracture: Secondary | ICD-10-CM

## 2019-04-22 NOTE — Progress Notes (Signed)
Patient ID: Priscilla Villanueva, female    DOB: 22-Jun-1950, 68 y.o.   MRN: [id number]  HPI  Ms Larkin is a 68 y/o female with a history of colon cancer, CAD, DM, hyperlipidemia, HTN, depression, previous tobacco use and chronic heart failure.   Echo report from 03/11/2019 reviewed and showed an EF of 50-55% along with mild MR/ moderate TR/PR and severely elevated PA pressure.   Right heart catheterization done 04/02/2019 showed:  Hemodynamic findings consistent with severe pulmonary hypertension.   Conclusion Successful right heart cath from right femoral vein Wedge pressure of 12 PA mean pressure of 51 consistent with moderate to severe pulmonary hypertension Recommend referral to pulmonary for further assessment and possible treatment of pulmonary hypertension  Admitted 04/10/2019 due to heart failure due to severe pulmonary HTN. Pulmonology consult obtained. Initially needed lasix drip and subsequently ultrafiltration. Dialysis catheter was placed by vascular. Discharged after 7 days.   She presents today for her initial visit with a chief complaint of moderate fatigue. She describes this as chronic in nature having been present for several years. She has associated decreased appetite, light-headedness and pedal edema (improving per her report). She denies any difficulty sleeping, abdominal distention, palpitations, chest pain, shortness of breath or weight gain.   Overall she says that she feels much improved since she was hospitalized.   Past Medical History:  Diagnosis Date  . CAD (coronary artery disease)   . CHF (congestive heart failure) ([place])   . Colon cancer ([place]) 2009   with chemotherapy  . Colon cancer ([place]) 11/26/2014  . Depression   . Diabetes mellitus without complication ([place])   . Hypercholesteremia   . Hypertension   . Obesity   . Personal history of chemotherapy 2009   colon ca   Past Surgical History:  Procedure Laterality Date  . ABDOMINAL HYSTERECTOMY     PARTIAL  . COLECTOMY    . COLONOSCOPY WITH PROPOFOL N/A 07/16/2017   Procedure: COLONOSCOPY WITH PROPOFOL;  Surgeon: Lollie Sails, MD;  Location: [facility] ENDOSCOPY;  Service: Endoscopy;  Laterality: N/A;  . PERIPHERAL VASCULAR CATHETERIZATION N/A 01/26/2015   Procedure: Glori Luis Cath Removal;  Surgeon: Katha Cabal, MD;  Location: [place] CV LAB;  Service: Cardiovascular;  Laterality: N/A;  . RIGHT HEART CATH AND CORONARY ANGIOGRAPHY Right 04/02/2019   Procedure: RIGHT HEART CATH AND CORONARY ANGIOGRAPHY;  Surgeon: Yolonda Kida, MD;  Location: [place] [facility] LAB;  Service: Cardiovascular;  Laterality: Right;  . TEMPORARY DIALYSIS CATHETER N/A 04/11/2019   Procedure: TEMPORARY DIALYSIS CATHETER;  Surgeon: Algernon Huxley, MD;  Location: [place] CV LAB;  Service: Cardiovascular;  Laterality: N/A;  . UPPER GI ENDOSCOPY     Family History  Problem Relation Age of Onset  . Colon cancer Mother        "had some type of tumor in the vulva liver unknown primary"  . Colon cancer Brother        "died at young age"  . Breast cancer Neg Hx    Social History   Tobacco Use  . Smoking status: Former Smoker    Packs/day: 1.00    Years: 3.00    Pack years: 3.00  . Smokeless tobacco: Never Used  Substance Use Topics  . Alcohol use: Yes    Alcohol/week: 1.0 standard drinks    Types: 1 Cans of beer per week    Comment: socially beer   Allergies  Allergen Reactions  . Amlodipine Swelling    Causes  pt's feet and legs to swell...  . Hydrochlorothiazide Other (See Comments)    Headaches.  . Lisinopril Swelling and Cough  . Sitagliptin Diarrhea  . Zoloft [Sertraline] Other (See Comments)    Unsure of exact reaction   Prior to Admission medications   Medication Sig Start Date End Date Taking? Authorizing Provider  allopurinol (ZYLOPRIM) 300 MG tablet Take 300 mg by mouth daily.    Yes [provider]  ALPRAZolam (XANAX) 0.25 MG tablet Take 0.25 mg by mouth at bedtime  as needed for sleep.    Yes [provider]  aspirin 81 MG EC tablet Take 81 mg by mouth every evening. Swallow whole.    Yes [provider]  atorvastatin (LIPITOR) 40 MG tablet Take 40 mg by mouth every evening.  01/18/19  Yes [provider]  b complex vitamins capsule Take 1 capsule by mouth daily.   Yes [provider]  cloNIDine (CATAPRES) 0.3 MG tablet Take 0.3 mg by mouth 3 (three) times daily.  11/24/14  Yes [provider]  Coenzyme Q10 (COQ10) 100 MG CAPS Take 100 mg by mouth every evening.   Yes [provider]  colchicine 0.6 MG tablet Take 1.2 mg by mouth daily as needed (gout flare ups).  08/28/14  Yes [provider]  HUMALOG MIX 75/25 KWIKPEN (75-25) 100 UNIT/ML Kwikpen Inject 28 Units into the skin 2 (two) times daily.  11/04/14  Yes [provider]  hydrALAZINE (APRESOLINE) 100 MG tablet Take 100 mg by mouth 3 (three) times daily. 03/10/19  Yes [provider]  liraglutide (VICTOZA) 18 MG/3ML SOPN Inject 0.6 mg into the skin daily.    Yes [provider]  metoprolol (TOPROL-XL) 200 MG 24 hr tablet Take 200 mg by mouth daily.   Yes [provider]  multivitamin (RENA-VIT) TABS tablet Take 1 tablet by mouth at bedtime. 04/17/19  Yes Ivor Costa, MD  olopatadine (PATANOL) 0.1 % ophthalmic solution Place 1 drop into both eyes 2 (two) times daily as needed for allergies.   Yes [provider]  polyethylene glycol (MIRALAX / GLYCOLAX) packet Take 17 g by mouth daily as needed (constipation.).    Yes [provider]  potassium chloride (KLOR-CON) 10 MEQ tablet Take 1 tablet (10 mEq total) by mouth daily. 04/17/19  Yes Ivor Costa, MD  torsemide (DEMADEX) 20 MG tablet Take 2 tablets (40 mg total) by mouth daily. Patient taking differently: Take 20 mg by mouth 2 (two) times daily.  04/17/19  Yes Ivor Costa, MD    Review of Systems  Constitutional: Positive for appetite change  (decreased) and fatigue (tire easily).  HENT: Negative for congestion, postnasal drip and sore throat.   Eyes: Negative.   Respiratory: Negative for chest tightness and shortness of breath.   Cardiovascular: Positive for leg swelling. Negative for chest pain and palpitations.  Gastrointestinal: Negative for abdominal distention and abdominal pain.  Endocrine: Negative.   Genitourinary: Negative.   Musculoskeletal: Negative for back pain and neck pain.  Skin: Negative.   Allergic/Immunologic: Negative.   Neurological: Positive for light-headedness. Negative for dizziness.  Hematological: Negative for adenopathy. Does not bruise/bleed easily.  Psychiatric/Behavioral: Negative for dysphoric mood and sleep disturbance (sleeping on 2 pillows; wearing oxygen at 2L at bedtime). The patient is not nervous/anxious.     Vitals:   04/23/19 1408  BP: 123/77  Pulse: 92  Resp: 18  SpO2: 95%  Weight: 220 lb 6.4 oz (100 kg)  Height: 5\' 4"  (  1.626 m)   Wt Readings from Last 3 Encounters:  04/23/19 220 lb 6.4 oz (100 kg)  04/17/19 219 lb 12.8 oz (99.7 kg)  04/02/19 255 lb (115.7 kg)   Lab Results  Component Value Date   CREATININE 1.56 (H) 04/17/2019   CREATININE 1.51 (H) 04/16/2019   CREATININE 1.49 (H) 04/15/2019    Physical Exam Vitals signs and nursing note reviewed.  Constitutional:      Appearance: She is well-developed.  HENT:     Head: Normocephalic and atraumatic.  Neck:     Musculoskeletal: Normal range of motion and neck supple.     Vascular: No JVD.  Cardiovascular:     Rate and Rhythm: Normal rate and regular rhythm.  Pulmonary:     Effort: Pulmonary effort is normal. No respiratory distress.     Breath sounds: No wheezing or rales.  Abdominal:     Palpations: Abdomen is soft.     Tenderness: There is no abdominal tenderness.  Musculoskeletal:     Right lower leg: She exhibits tenderness. Edema (2+ pitting) present.     Left lower leg: She exhibits tenderness. Edema  (2+pitting) present.  Skin:    General: Skin is warm and dry.  Neurological:     General: No focal deficit present.     Mental Status: She is alert and oriented to person, place, and time.  Psychiatric:        Mood and Affect: Mood normal.        Behavior: Behavior normal.    Assessment & Plan:  1: Chronic heart failure with preserved ejection fraction- - NYHA class III - euvolemic today - weighing daily and says that she's lost weight since her most recent admission; instructed to call for an overnight weight gain of >2 pounds or a weekly weight gain of >5 pounds - is not adding salt to her food and is trying to read food labels; reviewed the importance of closely following a 2000mg  sodium diet; written dietary information and low sodium cookbook was given to the patient - saw cardiology Clayborn Bigness) 03/20/2019 - waiting to begin CPAP - BNP 03/20/2019 was 939.0  2: HTN- - BP looks good today - saw PCP (Hande) 03/20/2019 & returns on 04/28/2019 - BMP 04/17/2019 reviewed and showed sodium 139, potassium 3.9, creatinine 1.56 and GFR 40  3: DM- - A1c 03/11/2019 was 7.2% - glucose at home today was 158 - saw nephrology Candiss Norse) 04/22/2019  4: Pulmonary HTN-  - has an upcoming appointment with [place] pulmonology  5: Lymphedema- - stage 2 - admits to not elevating her legs much during the day; encouraged her to elevate them when sitting for long periods of time - has compression socks on but edema persists - limited in her ability to exercise due to her fatigue - consider lymphapress compression boots if edema persists  Medication list was reviewed.   Return in 6 weeks or sooner for any questions/problems before then.

## 2019-04-23 ENCOUNTER — Encounter: Payer: Self-pay | Admitting: Family

## 2019-04-23 ENCOUNTER — Other Ambulatory Visit: Payer: Self-pay

## 2019-04-23 ENCOUNTER — Ambulatory Visit: Payer: Medicare Other | Attending: Family | Admitting: Family

## 2019-04-23 VITALS — BP 123/77 | HR 92 | Resp 18 | Ht 64.0 in | Wt 220.4 lb

## 2019-04-23 DIAGNOSIS — Z9221 Personal history of antineoplastic chemotherapy: Secondary | ICD-10-CM | POA: Diagnosis not present

## 2019-04-23 DIAGNOSIS — I110 Hypertensive heart disease with heart failure: Secondary | ICD-10-CM | POA: Insufficient documentation

## 2019-04-23 DIAGNOSIS — Z6837 Body mass index (BMI) 37.0-37.9, adult: Secondary | ICD-10-CM | POA: Insufficient documentation

## 2019-04-23 DIAGNOSIS — Z882 Allergy status to sulfonamides status: Secondary | ICD-10-CM | POA: Diagnosis not present

## 2019-04-23 DIAGNOSIS — Z794 Long term (current) use of insulin: Secondary | ICD-10-CM | POA: Insufficient documentation

## 2019-04-23 DIAGNOSIS — I890 Lymphedema, not elsewhere classified: Secondary | ICD-10-CM | POA: Diagnosis not present

## 2019-04-23 DIAGNOSIS — Z85038 Personal history of other malignant neoplasm of large intestine: Secondary | ICD-10-CM | POA: Insufficient documentation

## 2019-04-23 DIAGNOSIS — I5032 Chronic diastolic (congestive) heart failure: Secondary | ICD-10-CM | POA: Diagnosis not present

## 2019-04-23 DIAGNOSIS — Z87891 Personal history of nicotine dependence: Secondary | ICD-10-CM | POA: Insufficient documentation

## 2019-04-23 DIAGNOSIS — Z79899 Other long term (current) drug therapy: Secondary | ICD-10-CM | POA: Diagnosis not present

## 2019-04-23 DIAGNOSIS — I2510 Atherosclerotic heart disease of native coronary artery without angina pectoris: Secondary | ICD-10-CM | POA: Diagnosis not present

## 2019-04-23 DIAGNOSIS — E119 Type 2 diabetes mellitus without complications: Secondary | ICD-10-CM | POA: Insufficient documentation

## 2019-04-23 DIAGNOSIS — Z800 Family history of malignant neoplasm of digestive organs: Secondary | ICD-10-CM | POA: Insufficient documentation

## 2019-04-23 DIAGNOSIS — Z888 Allergy status to other drugs, medicaments and biological substances status: Secondary | ICD-10-CM | POA: Diagnosis not present

## 2019-04-23 DIAGNOSIS — E669 Obesity, unspecified: Secondary | ICD-10-CM | POA: Insufficient documentation

## 2019-04-23 DIAGNOSIS — E7800 Pure hypercholesterolemia, unspecified: Secondary | ICD-10-CM | POA: Insufficient documentation

## 2019-04-23 DIAGNOSIS — Z90710 Acquired absence of both cervix and uterus: Secondary | ICD-10-CM | POA: Diagnosis not present

## 2019-04-23 DIAGNOSIS — Z9049 Acquired absence of other specified parts of digestive tract: Secondary | ICD-10-CM | POA: Insufficient documentation

## 2019-04-23 DIAGNOSIS — F329 Major depressive disorder, single episode, unspecified: Secondary | ICD-10-CM | POA: Diagnosis not present

## 2019-04-23 DIAGNOSIS — N1832 Chronic kidney disease, stage 3b: Secondary | ICD-10-CM

## 2019-04-23 DIAGNOSIS — I272 Pulmonary hypertension, unspecified: Secondary | ICD-10-CM | POA: Insufficient documentation

## 2019-04-23 DIAGNOSIS — Z7982 Long term (current) use of aspirin: Secondary | ICD-10-CM | POA: Insufficient documentation

## 2019-04-23 DIAGNOSIS — I10 Essential (primary) hypertension: Secondary | ICD-10-CM

## 2019-04-23 NOTE — Patient Instructions (Signed)
Continue weighing daily and call for an overnight weight gain of > 2 pounds or a weekly weight gain of >5 pounds. 

## 2019-04-28 ENCOUNTER — Inpatient Hospital Stay: Payer: Medicare Other | Admitting: Pulmonary Disease

## 2019-05-05 ENCOUNTER — Inpatient Hospital Stay: Payer: Medicare Other

## 2019-05-05 ENCOUNTER — Other Ambulatory Visit: Payer: Self-pay

## 2019-05-05 ENCOUNTER — Inpatient Hospital Stay (HOSPITAL_COMMUNITY)
Admit: 2019-05-05 | Discharge: 2019-05-05 | Disposition: A | Discharge status: Home / Self Care | Payer: Medicare Other | Attending: Pulmonary Disease | Admitting: Pulmonary Disease

## 2019-05-05 ENCOUNTER — Inpatient Hospital Stay
Admission: EM | Admit: 2019-05-05 | Discharge: 2019-05-30 | DRG: 208 | Disposition: E | Payer: Medicare Other | Attending: Internal Medicine | Admitting: Internal Medicine

## 2019-05-05 ENCOUNTER — Emergency Department: Payer: Medicare Other

## 2019-05-05 DIAGNOSIS — Z515 Encounter for palliative care: Secondary | ICD-10-CM | POA: Diagnosis not present

## 2019-05-05 DIAGNOSIS — E7800 Pure hypercholesterolemia, unspecified: Secondary | ICD-10-CM | POA: Diagnosis present

## 2019-05-05 DIAGNOSIS — Z20828 Contact with and (suspected) exposure to other viral communicable diseases: Secondary | ICD-10-CM | POA: Diagnosis present

## 2019-05-05 DIAGNOSIS — J9602 Acute respiratory failure with hypercapnia: Secondary | ICD-10-CM | POA: Diagnosis present

## 2019-05-05 DIAGNOSIS — J9601 Acute respiratory failure with hypoxia: Principal | ICD-10-CM | POA: Diagnosis present

## 2019-05-05 DIAGNOSIS — E785 Hyperlipidemia, unspecified: Secondary | ICD-10-CM | POA: Diagnosis present

## 2019-05-05 DIAGNOSIS — I509 Heart failure, unspecified: Secondary | ICD-10-CM

## 2019-05-05 DIAGNOSIS — N1832 Chronic kidney disease, stage 3b: Secondary | ICD-10-CM | POA: Diagnosis present

## 2019-05-05 DIAGNOSIS — I130 Hypertensive heart and chronic kidney disease with heart failure and stage 1 through stage 4 chronic kidney disease, or unspecified chronic kidney disease: Secondary | ICD-10-CM | POA: Diagnosis present

## 2019-05-05 DIAGNOSIS — Z6839 Body mass index (BMI) 39.0-39.9, adult: Secondary | ICD-10-CM

## 2019-05-05 DIAGNOSIS — F329 Major depressive disorder, single episode, unspecified: Secondary | ICD-10-CM | POA: Diagnosis present

## 2019-05-05 DIAGNOSIS — E1122 Type 2 diabetes mellitus with diabetic chronic kidney disease: Secondary | ICD-10-CM | POA: Diagnosis present

## 2019-05-05 DIAGNOSIS — Z452 Encounter for adjustment and management of vascular access device: Secondary | ICD-10-CM

## 2019-05-05 DIAGNOSIS — Z79899 Other long term (current) drug therapy: Secondary | ICD-10-CM

## 2019-05-05 DIAGNOSIS — I2510 Atherosclerotic heart disease of native coronary artery without angina pectoris: Secondary | ICD-10-CM | POA: Diagnosis present

## 2019-05-05 DIAGNOSIS — Z9049 Acquired absence of other specified parts of digestive tract: Secondary | ICD-10-CM

## 2019-05-05 DIAGNOSIS — Z66 Do not resuscitate: Secondary | ICD-10-CM | POA: Diagnosis not present

## 2019-05-05 DIAGNOSIS — I63522 Cerebral infarction due to unspecified occlusion or stenosis of left anterior cerebral artery: Secondary | ICD-10-CM | POA: Diagnosis present

## 2019-05-05 DIAGNOSIS — Z800 Family history of malignant neoplasm of digestive organs: Secondary | ICD-10-CM

## 2019-05-05 DIAGNOSIS — I5033 Acute on chronic diastolic (congestive) heart failure: Secondary | ICD-10-CM | POA: Diagnosis present

## 2019-05-05 DIAGNOSIS — I2781 Cor pulmonale (chronic): Secondary | ICD-10-CM | POA: Diagnosis present

## 2019-05-05 DIAGNOSIS — I639 Cerebral infarction, unspecified: Secondary | ICD-10-CM | POA: Diagnosis not present

## 2019-05-05 DIAGNOSIS — I161 Hypertensive emergency: Secondary | ICD-10-CM | POA: Diagnosis present

## 2019-05-05 DIAGNOSIS — R570 Cardiogenic shock: Secondary | ICD-10-CM | POA: Diagnosis present

## 2019-05-05 DIAGNOSIS — Z9221 Personal history of antineoplastic chemotherapy: Secondary | ICD-10-CM

## 2019-05-05 DIAGNOSIS — Z87891 Personal history of nicotine dependence: Secondary | ICD-10-CM

## 2019-05-05 DIAGNOSIS — J810 Acute pulmonary edema: Secondary | ICD-10-CM | POA: Diagnosis present

## 2019-05-05 DIAGNOSIS — I63512 Cerebral infarction due to unspecified occlusion or stenosis of left middle cerebral artery: Secondary | ICD-10-CM | POA: Diagnosis present

## 2019-05-05 DIAGNOSIS — I2729 Other secondary pulmonary hypertension: Secondary | ICD-10-CM | POA: Diagnosis present

## 2019-05-05 DIAGNOSIS — J189 Pneumonia, unspecified organism: Secondary | ICD-10-CM | POA: Diagnosis present

## 2019-05-05 DIAGNOSIS — Z4659 Encounter for fitting and adjustment of other gastrointestinal appliance and device: Secondary | ICD-10-CM

## 2019-05-05 DIAGNOSIS — N189 Chronic kidney disease, unspecified: Secondary | ICD-10-CM

## 2019-05-05 DIAGNOSIS — Z85038 Personal history of other malignant neoplasm of large intestine: Secondary | ICD-10-CM | POA: Diagnosis not present

## 2019-05-05 DIAGNOSIS — N179 Acute kidney failure, unspecified: Secondary | ICD-10-CM | POA: Diagnosis present

## 2019-05-05 DIAGNOSIS — I132 Hypertensive heart and chronic kidney disease with heart failure and with stage 5 chronic kidney disease, or end stage renal disease: Secondary | ICD-10-CM | POA: Diagnosis not present

## 2019-05-05 DIAGNOSIS — E875 Hyperkalemia: Secondary | ICD-10-CM | POA: Diagnosis not present

## 2019-05-05 DIAGNOSIS — Z90710 Acquired absence of both cervix and uterus: Secondary | ICD-10-CM

## 2019-05-05 DIAGNOSIS — Z888 Allergy status to other drugs, medicaments and biological substances status: Secondary | ICD-10-CM

## 2019-05-05 DIAGNOSIS — E1121 Type 2 diabetes mellitus with diabetic nephropathy: Secondary | ICD-10-CM | POA: Diagnosis present

## 2019-05-05 DIAGNOSIS — Z7982 Long term (current) use of aspirin: Secondary | ICD-10-CM

## 2019-05-05 DIAGNOSIS — I313 Pericardial effusion (noninflammatory): Secondary | ICD-10-CM | POA: Diagnosis not present

## 2019-05-05 DIAGNOSIS — Z794 Long term (current) use of insulin: Secondary | ICD-10-CM

## 2019-05-05 LAB — CBC WITH DIFFERENTIAL/PLATELET
Abs Immature Granulocytes: 0.07 10*3/uL (ref 0.00–0.07)
Basophils Absolute: 0 10*3/uL (ref 0.0–0.1)
Basophils Relative: 0 %
Eosinophils Absolute: 0.1 10*3/uL (ref 0.0–0.5)
Eosinophils Relative: 1 %
HCT: 39.7 % (ref 36.0–46.0)
Hemoglobin: 11.8 g/dL — ABNORMAL LOW (ref 12.0–15.0)
Immature Granulocytes: 0 %
Lymphocytes Relative: 17 %
Lymphs Abs: 2.7 10*3/uL (ref 0.7–4.0)
MCH: 25.2 pg — ABNORMAL LOW (ref 26.0–34.0)
MCHC: 29.7 g/dL — ABNORMAL LOW (ref 30.0–36.0)
MCV: 84.6 fL (ref 80.0–100.0)
Monocytes Absolute: 0.6 10*3/uL (ref 0.1–1.0)
Monocytes Relative: 4 %
Neutro Abs: 12.2 10*3/uL — ABNORMAL HIGH (ref 1.7–7.7)
Neutrophils Relative %: 78 %
Platelets: 268 10*3/uL (ref 150–400)
RBC: 4.69 MIL/uL (ref 3.87–5.11)
RDW: 19 % — ABNORMAL HIGH (ref 11.5–15.5)
WBC: 15.7 10*3/uL — ABNORMAL HIGH (ref 4.0–10.5)
nRBC: 0.4 % — ABNORMAL HIGH (ref 0.0–0.2)

## 2019-05-05 LAB — BLOOD GAS, ARTERIAL
Acid-Base Excess: 0.8 mmol/L (ref 0.0–2.0)
Acid-base deficit: 6.6 mmol/L — ABNORMAL HIGH (ref 0.0–2.0)
Bicarbonate: 28.2 mmol/L — ABNORMAL HIGH (ref 20.0–28.0)
Bicarbonate: 19.1 mmol/L — ABNORMAL LOW (ref 20.0–28.0)
Delivery systems: POSITIVE
Expiratory PAP: 8
FIO2: 1
FIO2: 1
MECHVT: 480 mL
MECHVT: 480 mL
O2 Saturation: 89 %
O2 Saturation: 86.2 %
PEEP: 12 cmH2O
Patient temperature: 37
Patient temperature: 37
RATE: 10 resp/min
RATE: 30 resp/min
pCO2 arterial: 56 mmHg — ABNORMAL HIGH (ref 32.0–48.0)
pCO2 arterial: 38 mmHg (ref 32.0–48.0)
pH, Arterial: 7.31 — ABNORMAL LOW (ref 7.350–7.450)
pH, Arterial: 7.31 — ABNORMAL LOW (ref 7.350–7.450)
pO2, Arterial: 62 mmHg — ABNORMAL LOW (ref 83.0–108.0)
pO2, Arterial: 57 mmHg — ABNORMAL LOW (ref 83.0–108.0)

## 2019-05-05 LAB — LACTIC ACID, PLASMA
Lactic Acid, Venous: 4.6 mmol/L (ref 0.5–1.9)
Lactic Acid, Venous: 1.6 mmol/L (ref 0.5–1.9)

## 2019-05-05 LAB — CBC
HCT: 38.9 % (ref 36.0–46.0)
Hemoglobin: 11.2 g/dL — ABNORMAL LOW (ref 12.0–15.0)
MCH: 24.9 pg — ABNORMAL LOW (ref 26.0–34.0)
MCHC: 28.8 g/dL — ABNORMAL LOW (ref 30.0–36.0)
MCV: 86.4 fL (ref 80.0–100.0)
Platelets: 242 10*3/uL (ref 150–400)
RBC: 4.5 MIL/uL (ref 3.87–5.11)
RDW: 18.5 % — ABNORMAL HIGH (ref 11.5–15.5)
WBC: 19.2 10*3/uL — ABNORMAL HIGH (ref 4.0–10.5)
nRBC: 0.2 % (ref 0.0–0.2)

## 2019-05-05 LAB — BRAIN NATRIURETIC PEPTIDE: B Natriuretic Peptide: 1501 pg/mL — ABNORMAL HIGH (ref 0.0–100.0)

## 2019-05-05 LAB — CREATININE, SERUM
Creatinine, Ser: 2.07 mg/dL — ABNORMAL HIGH (ref 0.44–1.00)
GFR calc Af Amer: 28 mL/min — ABNORMAL LOW (ref >60)
GFR calc non Af Amer: 24 mL/min — ABNORMAL LOW (ref >60)

## 2019-05-05 LAB — SARS CORONAVIRUS 2 BY RT PCR (HOSPITAL ORDER, PERFORMED IN ~~LOC~~ HOSPITAL LAB): SARS Coronavirus 2: NEGATIVE

## 2019-05-05 LAB — GLUCOSE, CAPILLARY
Glucose-Capillary: 207 mg/dL — ABNORMAL HIGH (ref 70–99)
Glucose-Capillary: 184 mg/dL — ABNORMAL HIGH (ref 70–99)
Glucose-Capillary: 277 mg/dL — ABNORMAL HIGH (ref 70–99)
Glucose-Capillary: 189 mg/dL — ABNORMAL HIGH (ref 70–99)

## 2019-05-05 LAB — MRSA PCR SCREENING: MRSA by PCR: NEGATIVE

## 2019-05-05 LAB — COMPREHENSIVE METABOLIC PANEL
ALT: 31 U/L (ref 0–44)
AST: 35 U/L (ref 15–41)
Albumin: 3.6 g/dL (ref 3.5–5.0)
Alkaline Phosphatase: 108 U/L (ref 38–126)
Anion gap: 13 (ref 5–15)
BUN: 36 mg/dL — ABNORMAL HIGH (ref 8–23)
CO2: 22 mmol/L (ref 22–32)
Calcium: 8.6 mg/dL — ABNORMAL LOW (ref 8.9–10.3)
Chloride: 105 mmol/L (ref 98–111)
Creatinine, Ser: 2.11 mg/dL — ABNORMAL HIGH (ref 0.44–1.00)
GFR calc Af Amer: 28 mL/min — ABNORMAL LOW (ref >60)
GFR calc non Af Amer: 24 mL/min — ABNORMAL LOW (ref >60)
Glucose, Bld: 363 mg/dL — ABNORMAL HIGH (ref 70–99)
Potassium: 4.2 mmol/L (ref 3.5–5.1)
Sodium: 140 mmol/L (ref 135–145)
Total Bilirubin: 1 mg/dL (ref 0.3–1.2)
Total Protein: 6.9 g/dL (ref 6.5–8.1)

## 2019-05-05 LAB — TROPONIN I (HIGH SENSITIVITY)
Troponin I (High Sensitivity): 25 ng/L — ABNORMAL HIGH (ref <18)
Troponin I (High Sensitivity): 64 ng/L — ABNORMAL HIGH (ref <18)

## 2019-05-05 LAB — POC SARS CORONAVIRUS 2 AG: SARS Coronavirus 2 Ag: NEGATIVE

## 2019-05-05 LAB — PROCALCITONIN: Procalcitonin: <0.1 ng/mL

## 2019-05-05 LAB — TRIGLYCERIDES: Triglycerides: 109 mg/dL (ref <150)

## 2019-05-05 MED ORDER — CHLORHEXIDINE GLUCONATE CLOTH 2 % EX PADS
6.0000 | MEDICATED_PAD | Freq: Every day | CUTANEOUS | Status: DC
  Administered 2019-05-05 – 2019-05-06 (×2): 6 via TOPICAL

## 2019-05-05 MED ORDER — METOPROLOL SUCCINATE ER 50 MG PO TB24: 200.0000 mg | ORAL_TABLET | Freq: Every day | ORAL | Status: DC

## 2019-05-05 MED ORDER — LORAZEPAM 2 MG/ML IJ SOLN
INTRAMUSCULAR | Status: AC
  Filled 2019-05-05: qty 1

## 2019-05-05 MED ORDER — OLOPATADINE HCL 0.1 % OP SOLN
1.0000 [drp] | Freq: Two times a day (BID) | OPHTHALMIC | Status: DC | PRN
  Filled 2019-05-05: qty 5

## 2019-05-05 MED ORDER — NICARDIPINE HCL IN NACL 20-0.86 MG/200ML-% IV SOLN
3.0000 mg/h | INTRAVENOUS | Status: DC
  Administered 2019-05-05 (×2): 3 mg/h via INTRAVENOUS
  Filled 2019-05-05 (×2): qty 200

## 2019-05-05 MED ORDER — STERILE WATER FOR INJECTION IJ SOLN
INTRAMUSCULAR | Status: AC
  Filled 2019-05-05: qty 10

## 2019-05-05 MED ORDER — FENTANYL 2500MCG IN NS 250ML (10MCG/ML) PREMIX INFUSION
INTRAVENOUS | Status: AC
  Filled 2019-05-05: qty 250

## 2019-05-05 MED ORDER — CLONIDINE HCL 0.1 MG PO TABS: 0.3000 mg | ORAL_TABLET | Freq: Three times a day (TID) | ORAL | Status: DC

## 2019-05-05 MED ORDER — ORAL CARE MOUTH RINSE
15.0000 mL | OROMUCOSAL | Status: DC
  Administered 2019-05-05 – 2019-05-06 (×9): 15 mL via OROMUCOSAL

## 2019-05-05 MED ORDER — ALPRAZOLAM 0.25 MG PO TABS: 0.2500 mg | ORAL_TABLET | Freq: Every evening | ORAL | Status: DC | PRN

## 2019-05-05 MED ORDER — ALLOPURINOL 300 MG PO TABS
300.0000 mg | ORAL_TABLET | Freq: Every day | ORAL | Status: DC
  Administered 2019-05-06: 300 mg via ORAL
  Filled 2019-05-05 (×2): qty 1

## 2019-05-05 MED ORDER — INFLUENZA VAC A&B SA ADJ QUAD 0.5 ML IM PRSY
0.5000 mL | PREFILLED_SYRINGE | INTRAMUSCULAR | Status: DC
  Filled 2019-05-05: qty 0.5

## 2019-05-05 MED ORDER — INSULIN ASPART 100 UNIT/ML ~~LOC~~ SOLN
0.0000 [IU] | SUBCUTANEOUS | Status: DC
  Administered 2019-05-05: 8 [IU] via SUBCUTANEOUS
  Administered 2019-05-05 (×2): 3 [IU] via SUBCUTANEOUS
  Administered 2019-05-05 – 2019-05-06 (×2): 5 [IU] via SUBCUTANEOUS
  Filled 2019-05-05 (×5): qty 1

## 2019-05-05 MED ORDER — FUROSEMIDE 10 MG/ML IJ SOLN
100.0000 mg | Freq: Once | INTRAVENOUS | Status: AC
  Administered 2019-05-05: 100 mg via INTRAVENOUS
  Filled 2019-05-05: qty 10

## 2019-05-05 MED ORDER — VECURONIUM BROMIDE 10 MG IV SOLR
INTRAVENOUS | Status: AC
  Filled 2019-05-05: qty 10

## 2019-05-05 MED ORDER — MORPHINE SULFATE (PF) 2 MG/ML IV SOLN
2.0000 mg | Freq: Once | INTRAVENOUS | Status: AC
  Administered 2019-05-05: 2 mg via INTRAVENOUS
  Filled 2019-05-05: qty 1

## 2019-05-05 MED ORDER — MIDAZOLAM HCL 2 MG/2ML IJ SOLN
INTRAMUSCULAR | Status: AC
  Filled 2019-05-05: qty 4

## 2019-05-05 MED ORDER — HYDRALAZINE HCL 50 MG PO TABS: 100.0000 mg | ORAL_TABLET | Freq: Three times a day (TID) | ORAL | Status: DC

## 2019-05-05 MED ORDER — ASPIRIN EC 81 MG PO TBEC: 81.0000 mg | DELAYED_RELEASE_TABLET | Freq: Every evening | ORAL | Status: DC

## 2019-05-05 MED ORDER — LORAZEPAM 2 MG/ML IJ SOLN
0.5000 mg | Freq: Once | INTRAMUSCULAR | Status: AC
  Administered 2019-05-05: 0.5 mg via INTRAVENOUS

## 2019-05-05 MED ORDER — DOPAMINE-DEXTROSE 3.2-5 MG/ML-% IV SOLN
INTRAVENOUS | Status: AC
  Filled 2019-05-05: qty 250

## 2019-05-05 MED ORDER — COLCHICINE 0.6 MG PO TABS
1.2000 mg | ORAL_TABLET | Freq: Every day | ORAL | Status: DC | PRN
  Filled 2019-05-05: qty 2

## 2019-05-05 MED ORDER — POLYETHYLENE GLYCOL 3350 17 G PO PACK: 17.0000 g | PACK | Freq: Every day | ORAL | Status: DC | PRN

## 2019-05-05 MED ORDER — FENTANYL CITRATE (PF) 100 MCG/2ML IJ SOLN
INTRAMUSCULAR | Status: AC
  Filled 2019-05-05: qty 4

## 2019-05-05 MED ORDER — FUROSEMIDE 10 MG/ML IJ SOLN
6.0000 mg/h | INTRAVENOUS | Status: DC
  Administered 2019-05-05: 12:00:00 6 mg/h via INTRAVENOUS
  Filled 2019-05-05: qty 25

## 2019-05-05 MED ORDER — FENTANYL CITRATE (PF) 100 MCG/2ML IJ SOLN
200.0000 ug | Freq: Once | INTRAMUSCULAR | Status: AC
  Administered 2019-05-05: 14:00:00 200 ug via INTRAVENOUS

## 2019-05-05 MED ORDER — PROPOFOL 1000 MG/100ML IV EMUL
5.0000 ug/kg/min | INTRAVENOUS | Status: DC
  Administered 2019-05-05: 50 ug/kg/min via INTRAVENOUS
  Administered 2019-05-06: 15 ug/kg/min via INTRAVENOUS
  Filled 2019-05-05: qty 100

## 2019-05-05 MED ORDER — NITROGLYCERIN IN D5W 200-5 MCG/ML-% IV SOLN
0.0000 ug/min | INTRAVENOUS | Status: DC
  Administered 2019-05-05: 40 ug/min via INTRAVENOUS
  Administered 2019-05-05: 200 ug/min via INTRAVENOUS
  Filled 2019-05-05 (×2): qty 250

## 2019-05-05 MED ORDER — INSULIN DETEMIR 100 UNIT/ML ~~LOC~~ SOLN
15.0000 [IU] | Freq: Two times a day (BID) | SUBCUTANEOUS | Status: DC
  Administered 2019-05-05: 15 [IU] via SUBCUTANEOUS
  Filled 2019-05-05 (×3): qty 0.15

## 2019-05-05 MED ORDER — CHLORHEXIDINE GLUCONATE 0.12% ORAL RINSE (MEDLINE KIT)
15.0000 mL | Freq: Two times a day (BID) | OROMUCOSAL | Status: DC
  Administered 2019-05-05 – 2019-05-06 (×2): 15 mL via OROMUCOSAL

## 2019-05-05 MED ORDER — FUROSEMIDE 10 MG/ML IJ SOLN
40.0000 mg | Freq: Two times a day (BID) | INTRAMUSCULAR | Status: DC
  Administered 2019-05-05: 40 mg via INTRAVENOUS
  Filled 2019-05-05: qty 4

## 2019-05-05 MED ORDER — HEPARIN SODIUM (PORCINE) 5000 UNIT/ML IJ SOLN
5000.0000 [IU] | Freq: Three times a day (TID) | INTRAMUSCULAR | Status: DC
  Administered 2019-05-05 – 2019-05-06 (×5): 5000 [IU] via SUBCUTANEOUS
  Filled 2019-05-05 (×5): qty 1

## 2019-05-05 MED ORDER — LIRAGLUTIDE 18 MG/3ML ~~LOC~~ SOPN: 0.6000 mg | PEN_INJECTOR | Freq: Every day | SUBCUTANEOUS | Status: DC

## 2019-05-05 MED ORDER — LORAZEPAM 2 MG/ML IJ SOLN
1.0000 mg | Freq: Once | INTRAMUSCULAR | Status: AC
  Administered 2019-05-05: 1 mg via INTRAVENOUS
  Filled 2019-05-05: qty 1

## 2019-05-05 MED ORDER — MIDAZOLAM HCL 2 MG/2ML IJ SOLN
4.0000 mg | Freq: Once | INTRAMUSCULAR | Status: AC
  Administered 2019-05-05: 4 mg via INTRAVENOUS

## 2019-05-05 MED ORDER — DOPAMINE-DEXTROSE 3.2-5 MG/ML-% IV SOLN
0.0000 ug/kg/min | INTRAVENOUS | Status: DC
  Administered 2019-05-05: 5 ug/kg/min via INTRAVENOUS
  Administered 2019-05-06: 20 ug/kg/min via INTRAVENOUS
  Filled 2019-05-05 (×2): qty 250

## 2019-05-05 MED ORDER — VECURONIUM BROMIDE 10 MG IV SOLR
10.0000 mg | Freq: Once | INTRAVENOUS | Status: AC
  Administered 2019-05-05: 10 mg via INTRAVENOUS

## 2019-05-05 MED ORDER — FENTANYL 2500MCG IN NS 250ML (10MCG/ML) PREMIX INFUSION
0.0000 ug/h | INTRAVENOUS | Status: DC
  Administered 2019-05-05 – 2019-05-06 (×2): 100 ug/h via INTRAVENOUS
  Filled 2019-05-05: qty 250

## 2019-05-05 MED ORDER — ATORVASTATIN CALCIUM 20 MG PO TABS: 40.0000 mg | ORAL_TABLET | Freq: Every evening | ORAL | Status: DC

## 2019-05-05 MED ORDER — PROPOFOL 1000 MG/100ML IV EMUL
INTRAVENOUS | Status: AC
  Filled 2019-05-05: qty 100

## 2019-05-05 MED ORDER — INSULIN ASPART PROT & ASPART (70-30 MIX) 100 UNIT/ML ~~LOC~~ SUSP
28.0000 [IU] | Freq: Two times a day (BID) | SUBCUTANEOUS | Status: DC
  Administered 2019-05-05: 28 [IU] via SUBCUTANEOUS
  Filled 2019-05-05: qty 10

## 2019-05-05 NOTE — Progress Notes (Signed)
Inpatient Diabetes Program Recommendations  AACE/ADA: New Consensus Statement on Inpatient Glycemic Control (2015)  Target Ranges:  Prepandial:   less than 140 mg/dL      Peak postprandial:   less than 180 mg/dL (1-2 hours)      Critically ill patients:  140 - 180 mg/dL   Results for Priscilla Villanueva, Priscilla Villanueva (MRN [id number]) as of 05/03/2019 13:59  Ref. Range 04/30/2019 08:34 05/16/2019 11:31  Glucose-Capillary Latest Ref Range: 70 - 99 mg/dL 207 (H)  8 units NOVOLOG +  28 units 70/30 Insulin 184 (H)    Admit Acute hypoxic respiratory failure/ Acute CHF exacerbation/ HTN Crisis  History: DM, CKD, CHF   Home DM Meds: Humalog 75/25 Insulin- 28 units BID        Victoza 0.6 mg Daily  Current Orders: 70/30 Insulin 28 units BID         Novolog Moderate Correction Scale/ SSI (0-15 units) Q4 hours     Note patient now being Intubated and NPO.  70/30 Insulin not ideal insulin for patients who are NPO.    MD- Please consider the following adjustments to the inpatient insulin regimen:   1. Stop 70/30 Insulin  2. Start Levemir 15 units BID (this would be 70% of the longer-acting insulin patient gets in her 2 doses of Humalog 75/25 insulin per day)--Please start tonight at bedtime  3. Continue Novolog SSi Q4 hours      --Will follow patient during hospitalization--  Wyn Quaker RN, MSN, CDE Diabetes Coordinator Inpatient Glycemic Control Team Team Pager: [phone] (8a-5p)

## 2019-05-05 NOTE — H&P (Signed)
Name: Priscilla Villanueva MRN: [id number] DOB: 12/20/1950     CONSULTATION DATE: 05/19/2019  REFERRING MD : Vella Raring  CHIEF COMPLAINT: resp failure    HISTORY OF PRESENT ILLNESS:   68 y.o. female with a history of CAD CHF CKD diabetes, PULM hypertension who comes to the ED complaining of shortness of breath that started acutely this morning just after midnight. EMS report an initial oxygen saturation of 75% on 4 L nasal cannula when they arrived.  They switch the patient to CPAP and note that her oxygenation has remained at about 50% during their transport.  They placed Nitropaste on the chest as well.  She was admitted to ICU for severe respf ailure She failed BIPAP Husband at bedside updated-patient is in grave danger  Patient emergently intubated and placed on Vent  Patient critically ill High risk for cardiac arrest and death  PAST MEDICAL HISTORY :   has a past medical history of CAD (coronary artery disease), CHF (congestive heart failure) ([place]), Colon cancer ([place]) (2009), Colon cancer ([place]) (11/26/2014), Depression, Diabetes mellitus without complication ([place]), Hypercholesteremia, Hypertension, Obesity, and Personal history of chemotherapy (2009).  has a past surgical history that includes Abdominal hysterectomy; Upper gi endoscopy; Cardiac catheterization (N/A, 01/26/2015); Colectomy; Colonoscopy with propofol (N/A, 07/16/2017); RIGHT HEART CATH AND CORONARY ANGIOGRAPHY (Right, 04/02/2019); and TEMPORARY DIALYSIS CATHETER (N/A, 04/11/2019). Prior to Admission medications   Medication Sig Start Date End Date Taking? Authorizing Provider  allopurinol (ZYLOPRIM) 300 MG tablet Take 300 mg by mouth daily.    Yes [provider]  ALPRAZolam (XANAX) 0.25 MG tablet Take 0.25 mg by mouth at bedtime as needed for sleep.    Yes [provider]  aspirin 81 MG EC tablet Take 81 mg by mouth every evening. Swallow whole.    Yes [provider]  atorvastatin (LIPITOR) 40  MG tablet Take 40 mg by mouth every evening.  01/18/19  Yes [provider]  b complex vitamins capsule Take 1 capsule by mouth daily.   Yes [provider]  cloNIDine (CATAPRES) 0.3 MG tablet Take 0.3 mg by mouth 3 (three) times daily.  11/24/14  Yes [provider]  Coenzyme Q10 (COQ10) 100 MG CAPS Take 100 mg by mouth every evening.   Yes [provider]  colchicine 0.6 MG tablet Take 1.2 mg by mouth daily as needed (gout flare ups).  08/28/14  Yes [provider]  HUMALOG MIX 75/25 KWIKPEN (75-25) 100 UNIT/ML Kwikpen Inject 28 Units into the skin 2 (two) times daily.  11/04/14  Yes [provider]  hydrALAZINE (APRESOLINE) 100 MG tablet Take 100 mg by mouth 3 (three) times daily. 03/10/19  Yes [provider]  liraglutide (VICTOZA) 18 MG/3ML SOPN Inject 0.6 mg into the skin daily.    Yes [provider]  metoprolol (TOPROL-XL) 200 MG 24 hr tablet Take 200 mg by mouth daily.   Yes [provider]  multivitamin (RENA-VIT) TABS tablet Take 1 tablet by mouth at bedtime. 04/17/19  Yes Ivor Costa, MD  olopatadine (PATANOL) 0.1 % ophthalmic solution Place 1 drop into both eyes 2 (two) times daily as needed for allergies.   Yes [provider]  polyethylene glycol (MIRALAX / GLYCOLAX) packet Take 17 g by mouth daily as needed (constipation.).    Yes [provider]  potassium chloride (KLOR-CON) 10 MEQ tablet Take 1 tablet (10 mEq total) by mouth daily. 04/17/19  Yes Ivor Costa, MD  torsemide (DEMADEX) 20 MG tablet Take  2 tablets (40 mg total) by mouth daily. Patient taking differently: Take 20 mg by mouth 2 (two) times daily.  04/17/19  Yes Ivor Costa, MD   Allergies  Allergen Reactions   Amlodipine Swelling    Causes pt's feet and legs to swell...   Hydrochlorothiazide Other (See Comments)    Headaches.   Lisinopril Swelling and Cough   Sitagliptin Diarrhea   Zoloft [Sertraline] Other (See  Comments)    Unsure of exact reaction    FAMILY HISTORY:  family history includes Colon cancer in her brother and mother. SOCIAL HISTORY:  reports that she has quit smoking. She has a 3.00 pack-year smoking history. She has never used smokeless tobacco. She reports current alcohol use of about 1.0 standard drinks of alcohol per week. She reports that she does not use drugs.  REVIEW OF SYSTEMS:   Unable to obtain due to critical illness    VITAL SIGNS: Temp:  [97.6 F (36.4 C)-98.4 F (36.9 C)] 98.4 F (36.9 C) (12/07 1200) Pulse Rate:  [101-113] 101 (12/07 1300) Resp:  [10-48] 26 (12/07 1300) BP: (131-245)/(66-119) 149/72 (12/07 1300) SpO2:  [50 %-100 %] 90 % (12/07 1300) FiO2 (%):  [100 %] 100 % (12/07 1200) Weight:  [95.3 kg-102 kg] 102 kg (12/07 0840)   No intake/output data recorded. Total I/O In: 643.1 [P.O.:120; I.V.:523.1] Out: 150 [Urine:150]   SpO2: 90 % FiO2 (%): 100 %   Physical Examination:  GENERAL:critically ill appearing, +resp distress HEAD: Normocephalic, atraumatic.  EYES: Pupils equal, round, reactive to light.  No scleral icterus.  MOUTH: Moist mucosal membrane. NECK: Supple. No JVD.  PULMONARY: +rhonchi, +wheezing CARDIOVASCULAR: S1 and S2. Regular rate and rhythm. No murmurs, rubs, or gallops.  GASTROINTESTINAL: Soft, nontender, -distended. No masses. Positive bowel sounds. No hepatosplenomegaly.  MUSCULOSKELETAL: No swelling, clubbing, or edema.  NEUROLOGIC: obtunded SKIN:intact,warm,dry  I personally reviewed lab work that was obtained in last 24 hrs. CXR Independently reviewed-b/l interstitial opacities  MEDICATIONS: I have reviewed all medications and confirmed regimen as documented  CBC    Component Value Date/Time   WBC 19.2 (H) 05/28/2019 0608   RBC 4.50 05/03/2019 0608   HGB 11.2 (L) 05/26/2019 0608   HGB 11.6 (L) 04/13/2014 1540   HCT 38.9 05/19/2019 0608   HCT 39.2 12/08/2013 1437   PLT 242 05/03/2019 0608   PLT 169  12/08/2013 1437   MCV 86.4 05/08/2019 0608   MCV 78 (L) 12/08/2013 1437   MCH 24.9 (L) 05/25/2019 0608   MCHC 28.8 (L) 05/12/2019 0608   RDW 18.5 (H) 05/27/2019 0608   RDW 16.6 (H) 12/08/2013 1437   LYMPHSABS 2.7 05/08/2019 0322   LYMPHSABS 1.2 12/08/2013 1437   MONOABS 0.6 05/22/2019 0322   MONOABS 0.3 12/08/2013 1437   EOSABS 0.1 05/23/2019 0322   EOSABS 0.0 12/08/2013 1437   BASOSABS 0.0 05/12/2019 0322   BASOSABS 0.0 12/08/2013 1437   BMP Latest Ref Rng & Units 05/11/2019 05/04/2019 04/17/2019  Glucose 70 - 99 mg/dL - 363(H) 261(H)  BUN 8 - 23 mg/dL - 36(H) 24(H)  Creatinine 0.44 - 1.00 mg/dL 2.07(H) 2.11(H) 1.56(H)  Sodium 135 - 145 mmol/L - 140 139  Potassium 3.5 - 5.1 mmol/L - 4.2 3.9  Chloride 98 - 111 mmol/L - 105 100  CO2 22 - 32 mmol/L - 22 26  Calcium 8.9 - 10.3 mg/dL - 8.6(L) 8.6(L)     CULTURE RESULTS   Recent Results (from the past 240 hour(s))  SARS Coronavirus 2 by  RT PCR (hospital order, performed in [facility] hospital lab) Nasopharyngeal Nasopharyngeal Swab     Status: None   Collection Time: 05/17/2019  4:15 AM   Specimen: Nasopharyngeal Swab  Result Value Ref Range Status   SARS Coronavirus 2 NEGATIVE NEGATIVE Final    Comment: (NOTE) SARS-CoV-2 target nucleic acids are NOT DETECTED. The SARS-CoV-2 RNA is generally detectable in upper and lower respiratory specimens during the acute phase of infection. The lowest concentration of SARS-CoV-2 viral copies this assay can detect is 250 copies / mL. A negative result does not preclude SARS-CoV-2 infection and should not be used as the sole basis for treatment or other patient management decisions.  A negative result may occur with improper specimen collection / handling, submission of specimen other than nasopharyngeal swab, presence of viral mutation(s) within the areas targeted by this assay, and inadequate number of viral copies (<250 copies / mL). A negative result must be combined with  clinical observations, patient history, and epidemiological information. Fact Sheet for Patients:   StrictlyIdeas.no Fact Sheet for Healthcare Providers: BankingDealers.co.za This test is not yet approved or cleared  by the [place] FDA and has been authorized for detection and/or diagnosis of SARS-CoV-2 by FDA under an Emergency Use Authorization (EUA).  This EUA will remain in effect (meaning this test can be used) for the duration of the COVID-19 declaration under Section 564(b)(1) of the Act, 21 U.S.C. section 360bbb-3(b)(1), unless the authorization is terminated or revoked sooner. Performed at [facility], [address] [place], [place] [address]   MRSA PCR Screening     Status: None   Collection Time: 05/26/2019  8:35 AM   Specimen: Nasal Mucosa; Nasopharyngeal  Result Value Ref Range Status   MRSA by PCR NEGATIVE NEGATIVE Final    Comment:        The GeneXpert MRSA Assay (FDA approved for NASAL specimens only), is one component of a comprehensive MRSA colonization surveillance program. It is not intended to diagnose MRSA infection nor to guide or monitor treatment for MRSA infections. Performed at [facility], [address], [place], [place] [address]           IMAGING    Dg Chest Portable 1 View  Result Date: 05/14/2019 CLINICAL DATA:  Respiratory distress. Shortness of breath for 3 hours. History of CHF. EXAM: PORTABLE CHEST 1 VIEW COMPARISON:  04/11/2019 FINDINGS: Cardiomegaly, increased from prior exam. Diffuse bilateral heterogeneous opacities. Possible small pleural effusions. No pneumothorax. No acute osseous abnormalities. IMPRESSION: 1. Cardiomegaly, increased from exam last month. 2. Diffuse heterogeneous bilateral opacities, may represent pulmonary edema, multifocal pneumonia, or combination thereof. Small pleural effusions. Overall findings favor CHF. Electronically Signed   By:  Keith Rake M.D.   On: 05/08/2019 03:45        Indwelling Urinary Catheter continued, requirement due to   Reason to continue Indwelling Urinary Catheter strict Intake/Output monitoring for hemodynamic instability         Ventilator continued, requirement due to severe respiratory failure   Ventilator Sedation RASS 0 to -2      ASSESSMENT AND PLAN SYNOPSIS  68 yo CKD end stage with progressive resp failure with severe hypoxic rep failure from acute cardiogenic pulm edema with underlying PULM HTN  Severe ACUTE Hypoxic and Hypercapnic Respiratory Failure -continue Full MV support -continue Bronchodilator Therapy -Wean Fio2 and PEEP as tolerated  ACUTE DIASTOLIC CARDIAC FAILURE- Cor pulmonale Pulm HTN Prognosis is very poor   ACUTE KIDNEY INJURY/Renal Failure -follow  chem 7 -follow UO -continue Foley Catheter-assess need -Avoid nephrotoxic agents   NEUROLOGY - intubated and sedated - minimal sedation to achieve a RASS goal: -1  CARDIAC ICU monitoring  GI GI PROPHYLAXIS as indicated  NUTRITIONAL STATUS DIET-->TF's as tolerated Constipation protocol as indicated   ENDO - will use ICU hypoglycemic\Hyperglycemia protocol if needed    ELECTROLYTES -follow labs as needed -replace as needed -pharmacy consultation and following   DVT/GI PRX ordered TRANSFUSIONS AS NEEDED MONITOR FSBS ASSESS the need for LABS    Critical Care Time devoted to patient care services described in this note is 38 minutes.   Overall, patient is critically ill, prognosis is guarded.  Patient with Multiorgan failure and at high risk for cardiac arrest and death.    Corrin Parker, M.D.  [facility]  Medical Director [place] Director [facility] Cardio-Pulmonary Department

## 2019-05-05 NOTE — Progress Notes (Signed)
Pt arrived on unit at this time. Pt is alert and oriented and on bipap. Pt is labored and tachypnic. Nitro gtt infusing, VSS.

## 2019-05-05 NOTE — Progress Notes (Signed)
Pt intubated at this time. Pt was a difficult intubation due to amount of blood in airway. Size 8.5 ETT was used, chest xray ordered. Pt's O2 sats dropped to 30% during intubation and increased to 80%. Pt was bagged until O2 sats maintained at 90% and pt was placed on ventilator. Husband is aware.

## 2019-05-05 NOTE — Progress Notes (Signed)
Dr. Mortimer Fries notified of patient continuing to struggle to breathe despite bipap at 100% and ativan administration. Pt is coughing up blood and breathing about 35 times per minute. Dr. Mortimer Fries is at bedside discussing intubation with patient and husband.

## 2019-05-05 NOTE — Progress Notes (Signed)
Patient's bp is still high. Patient getting anxious now. Not doing any better. Turned bipap pressures down however situation remains the same.

## 2019-05-05 NOTE — ED Triage Notes (Signed)
Patient arrived from home by [facility] ems. pATIENT STATES HAVING SOB x 3 hours prior to arrival.  Patient has hx of CHF. [facility] arrived to room and placed on bipap.  Current O2 94%

## 2019-05-05 NOTE — ED Notes (Signed)
RT notified patient having issues breathing with the mask.

## 2019-05-05 NOTE — Progress Notes (Signed)
*  PRELIMINARY RESULTS* Echocardiogram 2D Echocardiogram has been performed.  Priscilla Villanueva 04/29/2019, 5:36 PM

## 2019-05-05 NOTE — ED Provider Notes (Addendum)
[facility] Emergency Department Provider Note  ____________________________________________  Time seen: Approximately 3:25 AM  I have reviewed the triage vital signs and the nursing notes.   HISTORY  Chief Complaint Respiratory Distress    Level 5 Caveat: Portions of the History and Physical including HPI and review of systems are unable to be completely obtained due to acute critical illness and respiratory distress.  HPI Priscilla Villanueva is a 68 y.o. female with a history of CAD CHF CKD diabetes hypertension  who comes to the ED complaining of shortness of breath that started acutely this morning just after midnight.  Worsening, constant, no aggravating or alleviating factors.  EMS report an initial oxygen saturation of 75% on 4 L nasal cannula when they arrived.  They switch the patient to CPAP and note that her oxygenation has remained at about 50% during their transport.  They placed Nitropaste on the chest as well.  Patient denies chest pain or sick contacts.  She has been compliant with her diuretic therapy and other medications..  She recently had a temporary dialysis catheter inserted.  Patient reports that this was just for one-time dialysis and then it was removed.     Past Medical History:  Diagnosis Date  . CAD (coronary artery disease)   . CHF (congestive heart failure) ([place])   . Colon cancer ([place]) 2009   with chemotherapy  . Colon cancer ([place]) 11/26/2014  . Depression   . Diabetes mellitus without complication ([place])   . Hypercholesteremia   . Hypertension   . Obesity   . Personal history of chemotherapy 2009   colon ca     Patient Active Problem List   Diagnosis Date Noted  . Pulmonary hypertension ([place]) 04/17/2019  . Pulmonary edema   . Volume overload 04/10/2019  . Hyperlipidemia associated with type 2 diabetes mellitus ([place])   . CKD (chronic kidney disease), stage III   . Acute CHF (congestive heart failure) ([place]) 03/11/2019   . SOB (shortness of breath) on exertion 12/22/2014  . Colon cancer ([place]) 11/26/2014  . Chronic diastolic CHF (congestive heart failure) ([place]) 10/01/2014  . Type II diabetes mellitus with renal manifestations ([place]) 10/01/2014  . Arteriosclerosis of coronary artery 10/01/2014  . Hypertension associated with diabetes ([place]) 10/01/2014  . Diabetes ([place]) 12/10/2013  . Adiposity 12/10/2013     Past Surgical History:  Procedure Laterality Date  . ABDOMINAL HYSTERECTOMY     PARTIAL  . COLECTOMY    . COLONOSCOPY WITH PROPOFOL N/A 07/16/2017   Procedure: COLONOSCOPY WITH PROPOFOL;  Surgeon: Lollie Sails, MD;  Location: [facility] ENDOSCOPY;  Service: Endoscopy;  Laterality: N/A;  . PERIPHERAL VASCULAR CATHETERIZATION N/A 01/26/2015   Procedure: Glori Luis Cath Removal;  Surgeon: Katha Cabal, MD;  Location: [facility] CV LAB;  Service: Cardiovascular;  Laterality: N/A;  . RIGHT HEART CATH AND CORONARY ANGIOGRAPHY Right 04/02/2019   Procedure: RIGHT HEART CATH AND CORONARY ANGIOGRAPHY;  Surgeon: Yolonda Kida, MD;  Location: [facility] CV LAB;  Service: Cardiovascular;  Laterality: Right;  . TEMPORARY DIALYSIS CATHETER N/A 04/11/2019   Procedure: TEMPORARY DIALYSIS CATHETER;  Surgeon: Algernon Huxley, MD;  Location: [facility] CV LAB;  Service: Cardiovascular;  Laterality: N/A;  . UPPER GI ENDOSCOPY       Prior to Admission medications   Medication Sig Start Date End Date Taking? Authorizing Provider  allopurinol (ZYLOPRIM) 300 MG tablet Take 300 mg by mouth daily.     [provider]  ALPRAZolam Duanne Moron)  0.25 MG tablet Take 0.25 mg by mouth at bedtime as needed for sleep.     [provider]  aspirin 81 MG EC tablet Take 81 mg by mouth every evening. Swallow whole.     [provider]  atorvastatin (LIPITOR) 40 MG tablet Take 40 mg by mouth every evening.  01/18/19   [provider]  b complex vitamins capsule Take 1 capsule by mouth daily.     [provider]  cloNIDine (CATAPRES) 0.3 MG tablet Take 0.3 mg by mouth 3 (three) times daily.  11/24/14   [provider]  Coenzyme Q10 (COQ10) 100 MG CAPS Take 100 mg by mouth every evening.    [provider]  colchicine 0.6 MG tablet Take 1.2 mg by mouth daily as needed (gout flare ups).  08/28/14   [provider]  HUMALOG MIX 75/25 KWIKPEN (75-25) 100 UNIT/ML Kwikpen Inject 28 Units into the skin 2 (two) times daily.  11/04/14   [provider]  hydrALAZINE (APRESOLINE) 100 MG tablet Take 100 mg by mouth 3 (three) times daily. 03/10/19   [provider]  liraglutide (VICTOZA) 18 MG/3ML SOPN Inject 0.6 mg into the skin daily.     [provider]  metoprolol (TOPROL-XL) 200 MG 24 hr tablet Take 200 mg by mouth daily.    [provider]  multivitamin (RENA-VIT) TABS tablet Take 1 tablet by mouth at bedtime. 04/17/19   Ivor Costa, MD  olopatadine (PATANOL) 0.1 % ophthalmic solution Place 1 drop into both eyes 2 (two) times daily as needed for allergies.    [provider]  polyethylene glycol (MIRALAX / GLYCOLAX) packet Take 17 g by mouth daily as needed (constipation.).     [provider]  potassium chloride (KLOR-CON) 10 MEQ tablet Take 1 tablet (10 mEq total) by mouth daily. 04/17/19   Ivor Costa, MD  torsemide (DEMADEX) 20 MG tablet Take 2 tablets (40 mg total) by mouth daily. Patient taking differently: Take 20 mg by mouth 2 (two) times daily.  04/17/19   Ivor Costa, MD     Allergies Amlodipine, Hydrochlorothiazide, Lisinopril, Sitagliptin, and Zoloft [sertraline]   Family History  Problem Relation Age of Onset  . Colon cancer Mother        "had some type of tumor in the vulva liver unknown primary"  . Colon cancer Brother        "died at young age"  . Breast cancer Neg Hx     Social History Social History   Tobacco Use  . Smoking status: Former Smoker    Packs/day: 1.00    Years: 3.00     Pack years: 3.00  . Smokeless tobacco: Never Used  Substance Use Topics  . Alcohol use: Yes    Alcohol/week: 1.0 standard drinks    Types: 1 Cans of beer per week    Comment: socially beer  . Drug use: No    Review of Systems Level 5 Caveat: Portions of the History and Physical including HPI and review of systems are unable to be completely obtained due to patient being a poor historian   Constitutional:   No known fever.  ENT:   No rhinorrhea. Cardiovascular:   No chest pain or syncope. Respiratory:   Positive shortness of breath without cough. Gastrointestinal:   Negative for abdominal pain, vomiting and diarrhea.  Musculoskeletal:   Positive leg swelling ____________________________________________   PHYSICAL EXAM:  VITAL SIGNS: ED Triage Vitals [05/14/2019 0316]  Enc Vitals  Group     BP      Pulse      Resp      Temp      Temp src      SpO2 (!) 50 %     Weight      Height      Head Circumference      Peak Flow      Pain Score      Pain Loc      Pain Edu?      Excl. in [place]?     Vital signs reviewed, nursing assessments reviewed.   Constitutional:   Alert and oriented.  Ill-appearing, respiratory distress. Eyes:   Conjunctivae are normal. EOMI. PERRL. ENT      Head:   Normocephalic and atraumatic.      Nose:   No congestion/rhinnorhea.       Mouth/Throat:   MMM, no pharyngeal erythema. No peritonsillar mass.       Neck:   No meningismus. Full ROM. Hematological/Lymphatic/Immunilogical:   No cervical lymphadenopathy. Cardiovascular:   Tachycardia heart rate 110. Symmetric bilateral radial and DP pulses.  No murmurs. Cap refill less than 2 seconds. Respiratory:   Tachypnea with accessory muscle use.  No wheezing.  Bilateral crackles. Gastrointestinal:   Soft and nontender. Non distended. There is no CVA tenderness.  No rebound, rigidity, or guarding.  Musculoskeletal:   Normal range of motion in all extremities. No joint effusions.  No lower extremity  tenderness.  3+ pitting edema bilateral lower extremities. Neurologic:   Normal speech and language.  Motor grossly intact. No acute focal neurologic deficits are appreciated.  Skin:    Skin is warm, dry and intact. No rash noted.  No petechiae, purpura, or bullae.  ____________________________________________    LABS (pertinent positives/negatives) (all labs ordered are listed, but only abnormal results are displayed) Labs Reviewed  COMPREHENSIVE METABOLIC PANEL - Abnormal; Notable for the following components:      Result Value   Glucose, Bld 363 (*)    BUN 36 (*)    Creatinine, Ser 2.11 (*)    Calcium 8.6 (*)    GFR calc non Af Amer 24 (*)    GFR calc Af Amer 28 (*)    All other components within normal limits  BRAIN NATRIURETIC PEPTIDE - Abnormal; Notable for the following components:   B Natriuretic Peptide 1,501.0 (*)    All other components within normal limits  CBC WITH DIFFERENTIAL/PLATELET - Abnormal; Notable for the following components:   WBC 15.7 (*)    Hemoglobin 11.8 (*)    MCH 25.2 (*)    MCHC 29.7 (*)    RDW 19.0 (*)    nRBC 0.4 (*)    Neutro Abs 12.2 (*)    All other components within normal limits  LACTIC ACID, PLASMA - Abnormal; Notable for the following components:   Lactic Acid, Venous 4.6 (*)    All other components within normal limits  TROPONIN I (HIGH SENSITIVITY) - Abnormal; Notable for the following components:   Troponin I (High Sensitivity) 25 (*)    All other components within normal limits  SARS CORONAVIRUS 2 BY RT PCR (HOSPITAL ORDER, [facility])  LACTIC ACID, PLASMA  POC SARS CORONAVIRUS 2 AG -  ED   ____________________________________________   EKG  Interpreted by me Sinus tachycardia rate 109, normal axis.  Slightly prolonged QTC of 503 ms.  Normal QRS.  Slight lateral ST depression.  No ST  elevation, normal T waves.  ST depression likely demand ischemia due to severe hypertension and  tachycardia.  ____________________________________________    RADIOLOGY  Dg Chest Portable 1 View  Result Date: 05/16/2019 CLINICAL DATA:  Respiratory distress. Shortness of breath for 3 hours. History of CHF. EXAM: PORTABLE CHEST 1 VIEW COMPARISON:  04/11/2019 FINDINGS: Cardiomegaly, increased from prior exam. Diffuse bilateral heterogeneous opacities. Possible small pleural effusions. No pneumothorax. No acute osseous abnormalities. IMPRESSION: 1. Cardiomegaly, increased from exam last month. 2. Diffuse heterogeneous bilateral opacities, may represent pulmonary edema, multifocal pneumonia, or combination thereof. Small pleural effusions. Overall findings favor CHF. Electronically Signed   By: Keith Rake M.D.   On: 05/21/2019 03:45    ____________________________________________   PROCEDURES .Critical Care Performed by: Carrie Mew, MD Authorized by: Carrie Mew, MD   Critical care provider statement:    Critical care time (minutes):  35   Critical care time was exclusive of:  Separately billable procedures and treating other patients   Critical care was necessary to treat or prevent imminent or life-threatening deterioration of the following conditions:  Respiratory failure and cardiac failure   Critical care was time spent personally by me on the following activities:  Development of treatment plan with patient or surrogate, discussions with consultants, evaluation of patient's response to treatment, examination of patient, obtaining history from patient or surrogate, ordering and performing treatments and interventions, ordering and review of laboratory studies, ordering and review of radiographic studies, pulse oximetry, re-evaluation of patient's condition and review of old charts    ____________________________________________  DIFFERENTIAL DIAGNOSIS   ACS, pleural effusion, pneumonia, COVID-19, pulmonary edema/heart failure/volume overload, PE  CLINICAL  IMPRESSION / ASSESSMENT AND PLAN / ED COURSE  Medications ordered in the ED: Medications  furosemide (LASIX) 100 mg in dextrose 5 % 50 mL IVPB (100 mg Intravenous New Bag/Given 05/23/2019 0409)  nitroGLYCERIN 50 mg in dextrose 5 % 250 mL (0.2 mg/mL) infusion (170 mcg/min Intravenous Rate/Dose Change 05/03/2019 0430)    Pertinent labs & imaging results that were available during my care of the patient were reviewed by me and considered in my medical decision making (see chart for details).   Priscilla Villanueva was evaluated in Emergency Department on 05/12/2019 for the symptoms described in the history of present illness. She was evaluated in the context of the global COVID-19 pandemic, which necessitated consideration that the patient might be at risk for infection with the SARS-CoV-2 virus that causes COVID-19. Institutional protocols and algorithms that pertain to the evaluation of patients at risk for COVID-19 are in a state of rapid change based on information released by regulatory bodies including the CDC and federal and state organizations. These policies and algorithms were followed during the patient's care in the ED.   Patient presents in respiratory distress, clinically appears to be volume overloaded with pulmonary edema.  Initial sat of 50% on EMS CPAP.  Placed on BiPAP on arrival, 20/5 with improvement of oxygenation to 99%.  Patient is still having significantly increased work of breathing.  We will give IV Lasix and nitroglycerin infusion.  She is allergic to ACE inhibitor's.  Check labs and chest x-ray.  Clinical Course as of May 06 714  Mon May 05, 2019  0343 Chest x-ray images reviewed by me, reveals diffuse pulmonary edema.  No rib fractures or significant pleural effusion.   [PS]  [id number] The patient is noted to have a lactate>4. With the current information available to me, I don't think the patient is  in septic shock. The lactate>4, is related to respiratory distress/ respiratory failure  due to CHF and volume overload.    [PS]  0401 Blood pressure is slightly improved.  Patient feeling more comfortable.  Still tachypneic, but work of breathing is improving.  Creatinine of 2.1 should allow for diuresis without requiring hemodialysis.   [PS]  0430 Discussed with ICU team for admission.   [PS]  0515 Still tachypneic, requiring 100% FiO2 oxygen to maintain sats in the 90s.  Nitroglycerin infusion going at 200 mcg/min, blood pressure remaining about 190/100.  Patient states that she continues to feel better gradually and denies any worsening difficulty breathing.   [PS]    Clinical Course User Index [PS] Carrie Mew, MD     ____________________________________________   FINAL CLINICAL IMPRESSION(S) / ED DIAGNOSES    Final diagnoses:  Acute pulmonary edema ([place])  Chronic kidney disease, unspecified CKD stage  Acute congestive heart failure, unspecified heart failure type [facility])     ED Discharge Orders    None      Portions of this note were generated with dragon dictation software. Dictation errors may occur despite best attempts at proofreading.   Carrie Mew, MD 05/14/2019 [place], MD 05/07/19 [phone]

## 2019-05-05 NOTE — Progress Notes (Signed)
[place], [place] 05/20/2019  Subjective:   Hospital day # 0 Patient known to our practice from previous admissions and outpatient  Arrive to ER via ems for SOB x 3 days BO elevated upon arrival Placed on BiPAP  Neuro: no/co. Able to answer Qs and follow commands Cvs: NIPPV due to acute SOB, BP elevated, requiring iv anti HTN infusion Pulm: pulm HTN- has not gone to [facility] yet. CXR with b/l infiltrates Gi: no c/o Renal: creatinine higher than baseline No intake/output data recorded. Lab Results  Component Value Date   CREATININE 2.07 (H) 05/25/2019   CREATININE 2.11 (H) 05/22/2019   CREATININE 1.56 (H) 04/17/2019     Objective:  Vital signs in last 24 hours:  Temp:  [97.6 F (36.4 C)-98.1 F (36.7 C)] 98.1 F (36.7 C) (12/07 0840) Pulse Rate:  [102-113] 103 (12/07 0900) Resp:  [10-48] 26 (12/07 0900) BP: (140-245)/(74-116) 161/88 (12/07 0900) SpO2:  [50 %-100 %] 98 % (12/07 0900) Weight:  [95.3 kg-102 kg] 102 kg (12/07 0840)  Weight change:  Filed Weights   05/29/2019 0326 05/15/2019 0840  Weight: 95.3 kg 102 kg    Intake/Output:    Intake/Output Summary (Last 24 hours) at 05/23/2019 1021 Last data filed at 04/29/2019 0913 Gross per 24 hour  Intake -  Output 0 ml  Net 0 ml     Physical Exam: General: Critically ill appearing  HEENT anicteric  Pulm/lungs NIPPV, diffuse b/l pulm crackles  CVS/Heart Tachycardic, regular  Abdomen:  Soft, NT  Extremities: +++ edema  Neurologic: Alert, able to follow commands  Skin: No acute rashes  Access:        Basic Metabolic Panel:  Recent Labs  Lab 05/14/2019 0322 05/08/2019 0608  NA 140  --   K 4.2  --   CL 105  --   CO2 22  --   GLUCOSE 363*  --   BUN 36*  --   CREATININE 2.11* 2.07*  CALCIUM 8.6*  --      CBC: Recent Labs  Lab 05/15/2019 0322 05/26/2019 0608  WBC 15.7* 19.2*  NEUTROABS 12.2*  --   HGB 11.8* 11.2*  HCT 39.7 38.9  MCV 84.6 86.4  PLT 268 242      Lab  Results  Component Value Date   HEPBSAG NON REACTIVE 04/11/2019   HEPBSAB NON REACTIVE 04/11/2019   HEPBIGM NON REACTIVE 04/11/2019      Microbiology:  Recent Results (from the past 240 hour(s))  SARS Coronavirus 2 by RT PCR (hospital order, performed in [facility] hospital lab) Nasopharyngeal Nasopharyngeal Swab     Status: None   Collection Time: 05/05/19  4:15 AM   Specimen: Nasopharyngeal Swab  Result Value Ref Range Status   SARS Coronavirus 2 NEGATIVE NEGATIVE Final    Comment: (NOTE) SARS-CoV-2 target nucleic acids are NOT DETECTED. The SARS-CoV-2 RNA is generally detectable in upper and lower respiratory specimens during the acute phase of infection. The lowest concentration of SARS-CoV-2 viral copies this assay can detect is 250 copies / mL. A negative result does not preclude SARS-CoV-2 infection and should not be used as the sole basis for treatment or other patient management decisions.  A negative result may occur with improper specimen collection / handling, submission of specimen other than nasopharyngeal swab, presence of viral mutation(s) within the areas targeted by this assay, and inadequate number of viral copies (<250 copies / mL). A negative result must be combined with clinical observations, patient history,  and epidemiological information. Fact Sheet for Patients:   StrictlyIdeas.no Fact Sheet for Healthcare Providers: BankingDealers.co.za This test is not yet approved or cleared  by the [place] FDA and has been authorized for detection and/or diagnosis of SARS-CoV-2 by FDA under an Emergency Use Authorization (EUA).  This EUA will remain in effect (meaning this test can be used) for the duration of the COVID-19 declaration under Section 564(b)(1) of the Act, 21 U.S.C. section 360bbb-3(b)(1), unless the authorization is terminated or revoked sooner. Performed at [facility], [address] [place], [place] [address]     Coagulation Studies: No results for input(s): LABPROT, INR in the last 72 hours.  Urinalysis: No results for input(s): COLORURINE, LABSPEC, PHURINE, GLUCOSEU, HGBUR, BILIRUBINUR, KETONESUR, PROTEINUR, UROBILINOGEN, NITRITE, LEUKOCYTESUR in the last 72 hours.  Invalid input(s): APPERANCEUR    Imaging: Dg Chest Portable 1 View  Result Date: 05/26/2019 CLINICAL DATA:  Respiratory distress. Shortness of breath for 3 hours. History of CHF. EXAM: PORTABLE CHEST 1 VIEW COMPARISON:  04/11/2019 FINDINGS: Cardiomegaly, increased from prior exam. Diffuse bilateral heterogeneous opacities. Possible small pleural effusions. No pneumothorax. No acute osseous abnormalities. IMPRESSION: 1. Cardiomegaly, increased from exam last month. 2. Diffuse heterogeneous bilateral opacities, may represent pulmonary edema, multifocal pneumonia, or combination thereof. Small pleural effusions. Overall findings favor CHF. Electronically Signed   By: Keith Rake M.D.   On: 05/21/2019 03:45     Medications:   . niCARDipine 3 mg/hr (05/07/2019 1021)  . nitroGLYCERIN 200 mcg/min (05/03/2019 0812)   . allopurinol  300 mg Oral Daily  . aspirin EC  81 mg Oral QPM  . atorvastatin  40 mg Oral QPM  . cloNIDine  0.3 mg Oral TID  . furosemide  40 mg Intravenous Q12H  . heparin  5,000 Units Subcutaneous Q8H  . hydrALAZINE  100 mg Oral TID  . [START ON 05/26/2019] influenza vaccine adjuvanted  0.5 mL Intramuscular Tomorrow-1000  . insulin aspart  0-15 Units Subcutaneous Q4H  . insulin aspart protamine- aspart  28 Units Subcutaneous BID WC  . liraglutide  0.6 mg Subcutaneous Daily  . metoprolol  200 mg Oral Daily   ALPRAZolam, colchicine, olopatadine, polyethylene glycol  Assessment/ Plan:  68 y.o.African American female  hypertension, diabetes mellitus type II, hyperlipidemia, depression, history of colon cancer, coronary artery disease, congestive heart failure with diastolic  disfunction, right rotator cuff trauma, hyperaldosteronism who is admitted from ER for Volume overload AKI CKD level 3  1. 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  Chronic kidney disease secondary to diabetic nephropathy Acute renal failure due to acute cardiorenal syndrome.  Non oliguric   2. Acute respiratory failure - Acute pulm edema and /or multifocal pneumonia - currently requiring NIPPV - COVID -19 neg 12/7  3.  Volume overload.  With LE edema Reports compliance with oral meds Last 2 D Echo (03/11/2019) = LVEF 50-55 %; LVH; Severe Pulmonary Hypertension Now getting iv furosemide  - will change to iv lasix infusion Pulmonary HTN-   evaluation at [facility] clinic is pending - if iv diuretic therapy fails, may need permcath for UF only dialysis   4. Hypertension with diastolic dysfuction: Iv nicardipine infusion, NTG infusion Expected to improve some with volume removal   5. Diabetes mellitus type II with chronic kidney disease: insulin dependent. Hemoglobin A1c of 7.2% on 03/11/2019 - hold metformin     LOS: 0 Durante Violett 12/7/202010:21 AM  [place], [place]  Note: This note was prepared with Dragon dictation. Any transcription errors are unintentional

## 2019-05-05 NOTE — Procedures (Signed)
Hemodialysis Catheter Insertion Procedure Note NASIRA JANUSZ [id number] 01/24/1951  Procedure: Insertion of Hemodialysis Catheter Indications: Dialysis Access   Procedure Details Consent: Unable to obtain consent because of altered level of consciousness. Time Out: Verified patient identification, verified procedure, site/side was marked, verified correct patient position, special equipment/implants available, medications/allergies/relevent history reviewed, required imaging and test results available.  Performed  Maximum sterile technique was used including antiseptics, cap, gloves, gown, hand hygiene, mask and sheet. Skin prep: Chlorhexidine; local anesthetic administered Triple lumen hemodialysis catheter was inserted into left internal jugular vein using the Seldinger technique.  Evaluation Blood flow good Complications: No apparent complications Patient did tolerate procedure well. Chest X-ray ordered to verify placement.  CXR: pending.   Left internal jugular temporary trialysis catheter placed utilizing ultrasound no complications noted during or following procedure.  Marda Stalker, [place] Pager [phone] (please enter 7 digits) PCCM Consult Pager [phone] (please enter 7 digits)

## 2019-05-05 NOTE — Procedures (Signed)
Endotracheal Intubation: Patient required placement of an artificial airway secondary to Respiratory Failure  Consent: Emergent.   Hand washing performed prior to starting the procedure.   Medications administered for sedation prior to procedure:  Midazolam 4 mg IV,  Vecuronium 10 mg IV, Fentanyl 100 mcg IV.    A time out procedure was called and correct patient, name, & ID confirmed. Needed supplies and equipment were assembled and checked to include ETT, 10 ml syringe, Glidescope, Mac and Miller blades, suction, oxygen and bag mask valve, end tidal CO2 monitor.   Patient was positioned to align the mouth and pharynx to facilitate visualization of the glottis.   Heart rate, SpO2 and blood pressure was continuously monitored during the procedure. Pre-oxygenation was conducted prior to intubation and endotracheal tube was placed through the vocal cords into the trachea.     The artificial airway was placed under direct visualization via glidescope route using a 8.5 ETT on the first attempt.  ETT was secured at 23 cm mark.  Placement was confirmed by auscuitation of lungs with good breath sounds bilaterally and no stomach sounds.  Condensation was noted on endotracheal tube.   Pulse ox 98%.  CO2 detector in place with appropriate color change.   Complications: None .   Operator: Adalina Dopson.   Chest radiograph ordered and pending.   Comments: OGT placed via glidescope.  Flordia Kassem David Symphany Fleissner, M.D.  [facility]  Medical Director [facility] Medical Director [facility]io[facility] Department       

## 2019-05-06 ENCOUNTER — Inpatient Hospital Stay: Payer: Medicare Other

## 2019-05-06 DIAGNOSIS — I639 Cerebral infarction, unspecified: Secondary | ICD-10-CM | POA: Diagnosis not present

## 2019-05-06 DIAGNOSIS — J9601 Acute respiratory failure with hypoxia: Secondary | ICD-10-CM | POA: Diagnosis not present

## 2019-05-06 DIAGNOSIS — I132 Hypertensive heart and chronic kidney disease with heart failure and with stage 5 chronic kidney disease, or end stage renal disease: Secondary | ICD-10-CM

## 2019-05-06 LAB — CBC
HCT: 38.2 % (ref 36.0–46.0)
Hemoglobin: 11.1 g/dL — ABNORMAL LOW (ref 12.0–15.0)
MCH: 25.4 pg — ABNORMAL LOW (ref 26.0–34.0)
MCHC: 29.1 g/dL — ABNORMAL LOW (ref 30.0–36.0)
MCV: 87.4 fL (ref 80.0–100.0)
Platelets: 209 10*3/uL (ref 150–400)
RBC: 4.37 MIL/uL (ref 3.87–5.11)
RDW: 18.9 % — ABNORMAL HIGH (ref 11.5–15.5)
WBC: 19.8 10*3/uL — ABNORMAL HIGH (ref 4.0–10.5)
nRBC: 3.8 % — ABNORMAL HIGH (ref 0.0–0.2)

## 2019-05-06 LAB — ECHOCARDIOGRAM COMPLETE
Height: 64 in
Weight: 3597.91 oz

## 2019-05-06 LAB — BASIC METABOLIC PANEL
Anion gap: 23 — ABNORMAL HIGH (ref 5–15)
BUN: 44 mg/dL — ABNORMAL HIGH (ref 8–23)
CO2: 14 mmol/L — ABNORMAL LOW (ref 22–32)
Calcium: 8.3 mg/dL — ABNORMAL LOW (ref 8.9–10.3)
Chloride: 104 mmol/L (ref 98–111)
Creatinine, Ser: 3.34 mg/dL — ABNORMAL HIGH (ref 0.44–1.00)
GFR calc Af Amer: 16 mL/min — ABNORMAL LOW (ref >60)
GFR calc non Af Amer: 14 mL/min — ABNORMAL LOW (ref >60)
Glucose, Bld: 107 mg/dL — ABNORMAL HIGH (ref 70–99)
Potassium: 5.9 mmol/L — ABNORMAL HIGH (ref 3.5–5.1)
Sodium: 141 mmol/L (ref 135–145)

## 2019-05-06 LAB — GLUCOSE, CAPILLARY
Glucose-Capillary: 116 mg/dL — ABNORMAL HIGH (ref 70–99)
Glucose-Capillary: 120 mg/dL — ABNORMAL HIGH (ref 70–99)
Glucose-Capillary: 208 mg/dL — ABNORMAL HIGH (ref 70–99)
Glucose-Capillary: 48 mg/dL — ABNORMAL LOW (ref 70–99)
Glucose-Capillary: 70 mg/dL (ref 70–99)
Glucose-Capillary: 94 mg/dL (ref 70–99)

## 2019-05-06 LAB — PHOSPHORUS: Phosphorus: 7.4 mg/dL — ABNORMAL HIGH (ref 2.5–4.6)

## 2019-05-06 LAB — MAGNESIUM: Magnesium: 2.3 mg/dL (ref 1.7–2.4)

## 2019-05-06 MED ORDER — NOREPINEPHRINE 16 MG/250ML-% IV SOLN
0.0000 ug/min | INTRAVENOUS | Status: DC
  Administered 2019-05-06: 5.333 ug/min via INTRAVENOUS
  Filled 2019-05-06: qty 250

## 2019-05-06 MED ORDER — ALLOPURINOL 100 MG PO TABS: 100.0000 mg | ORAL_TABLET | Freq: Every day | ORAL | Status: DC

## 2019-05-06 MED ORDER — MORPHINE 100MG IN NS 100ML (1MG/ML) PREMIX INFUSION
1.0000 mg/h | INTRAVENOUS | Status: DC
  Administered 2019-05-06: 2 mg/h via INTRAVENOUS
  Filled 2019-05-06: qty 100

## 2019-05-06 MED ORDER — EPINEPHRINE 1 MG/10ML IJ SOSY
PREFILLED_SYRINGE | INTRAMUSCULAR | Status: AC
  Filled 2019-05-06: qty 10

## 2019-05-06 MED ORDER — INSULIN ASPART 100 UNIT/ML ~~LOC~~ SOLN: 0.0000 [IU] | SUBCUTANEOUS | Status: DC

## 2019-05-06 MED ORDER — SODIUM ZIRCONIUM CYCLOSILICATE 5 G PO PACK
10.0000 g | PACK | Freq: Once | ORAL | Status: AC
  Administered 2019-05-06: 14:00:00 10 g via ORAL
  Filled 2019-05-06: qty 2

## 2019-05-06 MED ORDER — VASOPRESSIN 20 UNIT/ML IV SOLN
0.0300 [IU]/min | INTRAVENOUS | Status: DC
  Administered 2019-05-06: 0.03 [IU]/min via INTRAVENOUS
  Filled 2019-05-06: qty 2

## 2019-05-06 MED ORDER — PANTOPRAZOLE SODIUM 40 MG IV SOLR
40.0000 mg | INTRAVENOUS | Status: DC
  Administered 2019-05-06: 40 mg via INTRAVENOUS
  Filled 2019-05-06: qty 40

## 2019-05-06 MED ORDER — INSULIN DETEMIR 100 UNIT/ML ~~LOC~~ SOLN
8.0000 [IU] | Freq: Two times a day (BID) | SUBCUTANEOUS | Status: DC
  Filled 2019-05-06 (×2): qty 0.08

## 2019-05-06 MED ORDER — GLYCOPYRROLATE 0.2 MG/ML IJ SOLN: 0.2000 mg | Freq: Four times a day (QID) | INTRAMUSCULAR | Status: DC | PRN

## 2019-05-06 MED ORDER — DEXTROSE 50 % IV SOLN
INTRAVENOUS | Status: AC
  Filled 2019-05-06: qty 50

## 2019-05-06 MED ORDER — ATROPINE SULFATE 1 MG/10ML IJ SOSY
PREFILLED_SYRINGE | INTRAMUSCULAR | Status: AC
  Filled 2019-05-06: qty 10

## 2019-05-06 MED ORDER — DEXTROSE 50 % IV SOLN
25.0000 mL | Freq: Once | INTRAVENOUS | Status: AC
  Administered 2019-05-06: 25 mL via INTRAVENOUS

## 2019-05-09 ENCOUNTER — Other Ambulatory Visit: Payer: Medicare Other

## 2019-05-09 ENCOUNTER — Inpatient Hospital Stay: Payer: Medicare Other | Admitting: Pulmonary Disease

## 2019-05-12 ENCOUNTER — Telehealth: Payer: Self-pay | Admitting: Internal Medicine

## 2019-05-12 NOTE — Telephone Encounter (Signed)
Death certificate has been placed in Dr. Kasa's folder.    

## 2019-05-13 NOTE — Telephone Encounter (Signed)
Death certificate has been completed and placed up front for pickup. Lm for [employer].

## 2019-05-13 NOTE — Telephone Encounter (Signed)
[employer] is aware that death certificate is ready for pickup.

## 2019-05-15 ENCOUNTER — Ambulatory Visit: Payer: Medicare Other | Admitting: Family

## 2019-05-30 NOTE — Progress Notes (Signed)
Initial Nutrition Assessment  DOCUMENTATION CODES:   Obesity unspecified  INTERVENTION:   If tube feeds initiated, recommend:  Vital HP @55ml /hr- Initiate at 59ml/hr and increase by 34ml/hr q 12 hours until goal rate is reached  Free water flushes 37ml q4 hours to maintain tube patency   Regimen provides 1320kcal/day, 116g/day protein, 1267ml/day free water  NUTRITION DIAGNOSIS:   Inadequate oral intake related to inability to eat(pt sedated and ventilated) as evidenced by NPO status.  GOAL:   Provide needs based on ASPEN/SCCM guidelines  MONITOR:   Vent status, Labs, Skin, I & O's  REASON FOR ASSESSMENT:   Ventilator    ASSESSMENT:   69 y.o. 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 OGT in place. RD familiar with this patient from a recent previous admit. Pt with poor appetite and oral intake at baseline. Pt eating <50% of meals during her last admit. No plans for tube feeds today. Per chart, pt appears fairly weight stable pta.    Medications reviewed and include: allopurinol, aspirin, heparin, insulin, protonix, dopamine, fentanyl, lasix  Labs reviewed: K 5.9(H), BUN 44(H), creat 3.34(H), P 7.4(H) Wbc- 19.8(H), MCH 25.4(L), MCHC 29.1(L)  Patient is currently intubated on ventilator support MV: 14.4 L/min Temp (24hrs), Avg:98.8 F (37.1 C), Min:97.9 F (36.6 C), Max:99.8 F (37.7 C)  Propofol: none   MAP- >58mmHg  UOP- 859ml  NUTRITION - FOCUSED PHYSICAL EXAM:    Most Recent Value  Orbital Region  No depletion  Upper Arm Region  No depletion  Thoracic and Lumbar Region  No depletion  Buccal  Region  No depletion  Temple Region  No depletion  Clavicle Bone Region  No depletion  Clavicle and Acromion Bone Region  No depletion  Scapular Bone Region  No depletion  Dorsal Hand  No depletion  Patellar Region  No depletion  Anterior Thigh Region  No depletion  Posterior Calf Region  No depletion  Edema (RD Assessment)  Moderate  Hair  Reviewed  Eyes  Reviewed  Mouth  Reviewed  Skin  Reviewed  Nails  Reviewed     Diet Order:   Diet Order    None     EDUCATION NEEDS:   Not appropriate for education at this time  Skin:  Skin Assessment: Reviewed RN Assessment  Last BM:  pta  Height:   Ht Readings from Last 1 Encounters:  05/24/2019 5\' 4"  (1.626 m)    Weight:   Wt Readings from Last 1 Encounters:  05/11/2019 103.4 kg    Ideal Body Weight:  54.5 kg  BMI:  Body mass index is 39.13 kg/m.  Estimated Nutritional Needs:   Kcal:  1133-1442kcal/day  Protein:  >110g/day  Fluid:  1.6L/day  Koleen Distance MS, RD, LDN Pager #- [phone] Office#- [phone] After Hours Pager: [phone]

## 2019-05-30 NOTE — Progress Notes (Addendum)
Inpatient Diabetes Program Recommendations  AACE/ADA: New Consensus Statement on Inpatient Glycemic Control (2015)  Target Ranges:  Prepandial:   less than 140 mg/dL      Peak postprandial:   less than 180 mg/dL (1-2 hours)      Critically ill patients:  140 - 180 mg/dL   Results for Priscilla Villanueva, Priscilla Villanueva (MRN [id number]) as of 05/29/19 08:28  Ref. Range 04/30/2019 08:34 04/30/2019 11:31 05/13/2019 16:19 05/02/2019 21:31  Glucose-Capillary Latest Ref Range: 70 - 99 mg/dL 207 (H)  5 units NOVOLOG +  28 units 70/30 Insulin  184 (H)  3 units NOVOLOG  277 (H)  8 units NOVOLOG  189 (H)  3 units NOVOLOG +  15 units LEVEMIR given at 10pm    Results for Priscilla Villanueva, Priscilla Villanueva (MRN [id number]) as of May 29, 2019 08:28  Ref. Range May 29, 2019 00:41 05/29/2019 04:07 May 29, 2019 07:29 May 29, 2019 07:48  Glucose-Capillary Latest Ref Range: 70 - 99 mg/dL 208 (H)  5 units NOVOLOG  116 (H) 48 (L) 70    Home DM Meds: Humalog 75/25 Insulin- 28 units BID                              Victoza 0.6 mg Daily  Current Orders: Levemir 15 units BID                             Novolog Moderate Correction Scale/ SSI (0-15 units) Q4 hours      MD- Note 70/30 Insulin stopped due to patient's NPO status.  Levemir started last PM at lower dose than pt gets in her home 75/25 Insulin.  Hypoglycemia this AM (CBG 48 mg/dl)--HYPO may have been precipitated by rising Creatinine  1. Please consider reducing Levemir to 8 units BID (0.15 units/kg)  2. May also want to reduce the Novolog SSI to the Sensitive scale (0-9 units) Q4 hours     Note patient Intubated and NPO.  Requiring Vasopressor support.  Got Dialysis Catheter last PM--Creatinine rising.    --Will follow patient during hospitalization--  Wyn Quaker RN, MSN, CDE Diabetes Coordinator Inpatient Glycemic Control Team Team Pager: [phone] (8a-5p)

## 2019-05-30 NOTE — Progress Notes (Signed)
CRITICAL CARE NOTE 69 year old morbidly obese African-American female with underlying cor pulmonale pulmonary hypertension with diastolic heart failure with CKD admitted to the ICU for severe respiratory failure from hypoxia pulmonary edema with progressive cardiorenal syndrome Requiring full ventilatory support with cardiogenic shock   CC  follow up respiratory failure  SUBJECTIVE Patient remains critically ill Prognosis is guarded Remains critically ill FiO2 100% PEEP is 10 Patient on multiple pressors High risk for cardiac arrest and death Daughter-in-law healthcare power of attorney Cleon Dew has been notified of clinical status She was not aware of her mother-in-law's situation   BP (!) 101/47   Pulse (!) 103   Temp 98.1 F (36.7 C) (Tympanic)   Resp (!) 30   Ht 5\' 4"  (1.626 m)   Wt 103.4 kg   SpO2 99%   BMI 39.13 kg/m    I/O last 3 completed shifts: In: 1400.2 [P.O.:120; I.V.:1280.2] Out: 850 [Urine:850] Total I/O In: 135.4 [I.V.:135.4] Out: -   SpO2: 99 % FiO2 (%): 80 %  Vent Mode: PRVC FiO2 (%):  [80 %-100 %] 80 % Set Rate:  [30 bmp] 30 bmp Vt Set:  [480 mL] 480 mL PEEP:  [10 cmH20-14 cmH20] 14 cmH20 Plateau Pressure:  [28 cmH20-33 cmH20] 28 cmH20   SIGNIFICANT EVENTS 12/7 severe hypoxia, emergent intubation 12/8 multiorgan failure, acute CVA on CT head, Trialysis placed  REVIEW OF SYSTEMS  PATIENT IS UNABLE TO PROVIDE COMPLETE REVIEW OF SYSTEMS DUE TO SEVERE CRITICAL ILLNESS   PHYSICAL EXAMINATION:  GENERAL:critically ill appearing, +resp distress HEAD: Normocephalic, atraumatic.  EYES: Pupils equal, round, reactive to light.  No scleral icterus.  MOUTH: Moist mucosal membrane. NECK: Supple.  PULMONARY: +rhonchi, +wheezing CARDIOVASCULAR: S1 and S2. Regular rate and rhythm. No murmurs, rubs, or gallops.  GASTROINTESTINAL: Soft, nontender, -distended. No masses. Positive bowel sounds. No hepatosplenomegaly.  MUSCULOSKELETAL: No swelling,  clubbing, or edema.  NEUROLOGIC: obtunded, GCS<8 SKIN:intact,warm,dry  MEDICATIONS: I have reviewed all medications and confirmed regimen as documented   CULTURE RESULTS   Recent Results (from the past 240 hour(s))  SARS Coronavirus 2 by RT PCR (hospital order, performed in [facility] hospital lab) Nasopharyngeal Nasopharyngeal Swab     Status: None   Collection Time: 05/04/2019  4:15 AM   Specimen: Nasopharyngeal Swab  Result Value Ref Range Status   SARS Coronavirus 2 NEGATIVE NEGATIVE Final    Comment: (NOTE) SARS-CoV-2 target nucleic acids are NOT DETECTED. The SARS-CoV-2 RNA is generally detectable in upper and lower respiratory specimens during the acute phase of infection. The lowest concentration of SARS-CoV-2 viral copies this assay can detect is 250 copies / mL. A negative result does not preclude SARS-CoV-2 infection and should not be used as the sole basis for treatment or other patient management decisions.  A negative result may occur with improper specimen collection / handling, submission of specimen other than nasopharyngeal swab, presence of viral mutation(s) within the areas targeted by this assay, and inadequate number of viral copies (<250 copies / mL). A negative result must be combined with clinical observations, patient history, and epidemiological information. Fact Sheet for Patients:   StrictlyIdeas.no Fact Sheet for Healthcare Providers: BankingDealers.co.za This test is not yet approved or cleared  by the [place] FDA and has been authorized for detection and/or diagnosis of SARS-CoV-2 by FDA under an Emergency Use Authorization (EUA).  This EUA will remain in effect (meaning this test can be used) for the duration of the COVID-19 declaration under Section 564(b)(1) of the Act, 21 U.S.C.  section 360bbb-3(b)(1), unless the authorization is terminated or revoked sooner. Performed at [facility], [address] [place], [place] [address]   MRSA PCR Screening     Status: None   Collection Time: 05/18/2019  8:35 AM   Specimen: Nasal Mucosa; Nasopharyngeal  Result Value Ref Range Status   MRSA by PCR NEGATIVE NEGATIVE Final    Comment:        The GeneXpert MRSA Assay (FDA approved for NASAL specimens only), is one component of a comprehensive MRSA colonization surveillance program. It is not intended to diagnose MRSA infection nor to guide or monitor treatment for MRSA infections. Performed at [facility], [address] [place], [place] [address]           IMAGING    Dg Chest 1 View  Result Date: 05/04/2019 CLINICAL DATA:  ETT placement EXAM: CHEST  1 VIEW COMPARISON:  05/20/2019 FINDINGS: Endotracheal tube with the tip 5.5 cm above the carina. Nasogastric tube coursing below the diaphragm. Left jugular central venous catheter with the tip projecting over the lower neck. Diffuse bilateral interstitial and alveolar airspace opacities. No pleural effusion. No pneumothorax. Stable cardiomediastinal silhouette. No aggressive osseous lesion. IMPRESSION: 1. Endotracheal tube with the tip 5.5 cm above the carina. 2. Nasogastric tube coursing below the diaphragm. 3. Left jugular central venous catheter with the tip projecting over the lower neck. 4. Diffuse bilateral interstitial and alveolar airspace opacities most concerning for florid pulmonary edema. Electronically Signed   By: Kathreen Devoid   On: 05/08/2019 14:14   Dg Abd 1 View  Result Date: 05/27/2019 CLINICAL DATA:  Status post OG tube placement. EXAM: ABDOMEN - 1 VIEW COMPARISON:  None. FINDINGS: OG tube is in good position with both the tip and side-port in the stomach. IMPRESSION: As above. Electronically Signed   By: Inge Rise M.D.   On: 05/01/2019 14:16   Ct Head Wo Contrast  Result Date: 09-May-2019 CLINICAL DATA:  Patient admitted yesterday with shortness of breath for 3 days. The patient is  unresponsive today. EXAM: CT HEAD WITHOUT CONTRAST TECHNIQUE: Contiguous axial images were obtained from the base of the skull through the vertex without intravenous contrast. COMPARISON:  Brain MRI 02/25/2019. FINDINGS: Brain: There is hypoattenuation in the left cerebral hemisphere along the falx most consistent with an ACA territory infarct. More subtle hypoattenuation is seen in the high right parietal and frontal lobes worrisome for infarct. No hemorrhage, midline shift, hydrocephalus or mass. Vascular: Atherosclerosis.  No hyperdense vessel is identified. Skull: Intact.  No focal lesion. Sinuses/Orbits: Negative. Other: None. IMPRESSION: Findings most consistent with a left ACA territory infarct. More subtle findings worrisome for infarct are seen in the high right frontal and parietal lobes. Negative for hemorrhage or hydrocephalus. These results were called by telephone at the time of interpretation on 05/09/2019 at 10:02 am to provider Sherre Lain, RN, who verbally acknowledged these results. Electronically Signed   By: Inge Rise M.D.   On: May 09, 2019 10:04   Dg Chest Port 1 View  Result Date: 05/10/2019 CLINICAL DATA:  Central line placement EXAM: PORTABLE CHEST 1 VIEW COMPARISON:  05/21/2019 FINDINGS: The endotracheal tube terminates above the carina. There is a new left-sided central venous catheter with tip terminating over the expected region of the left brachiocephalic vein. The heart size remains stable. Diffuse bilateral hazy airspace opacities are again noted, with some interval improvement in the upper lung zones bilaterally. There is no pneumothorax. There are possible small bilateral pleural  effusions. IMPRESSION: 1. Lines and tubes as above.  No pneumothorax. 2. There is some interval improvement in the hazy bilateral airspace opacities, especially in the upper lung zones bilaterally. Electronically Signed   By: Constance Holster M.D.   On: 05/05/2019 21:35       Indwelling  Urinary Catheter continued, requirement due to   Reason to continue Indwelling Urinary Catheter strict Intake/Output monitoring for hemodynamic instability   Central Line/ continued, requirement due to  Reason to continue [place] of central venous pressure or other hemodynamic parameters and poor IV access   Ventilator continued, requirement due to severe respiratory failure   Ventilator Sedation RASS 0 to -2      ASSESSMENT AND PLAN SYNOPSIS   Severe ACUTE Hypoxic and Hypercapnic Respiratory Failure -continue Full MV support -continue Bronchodilator Therapy -Wean Fio2 and PEEP as tolerated   ACUTE DIASTOLIC CARDIAC FAILURE- Vent support      ACUTE KIDNEY INJURY/Renal Failure -follow chem 7 -follow UO -continue Foley Catheter-assess need -Avoid nephrotoxic agents -Recheck creatinine   NEUROLOGY Acute CVA Prognosis is poor Neurology Consulted   SHOCK-CARDIOGENIC -use vasopressors to keep MAP>65 -follow ABG and LA -follow up cultures   CARDIAC ICU monitoring   GI GI PROPHYLAXIS as indicated  NUTRITIONAL STATUS DIET-->NPO Constipation protocol as indicated  ENDO - will use ICU hypoglycemic\Hyperglycemia protocol if indicated   ELECTROLYTES -follow labs as needed -replace as needed -pharmacy consultation and following   DVT/GI PRX ordered TRANSFUSIONS AS NEEDED MONITOR FSBS ASSESS the need for LABS as needed   Critical Care Time devoted to patient care services described in this note is 43 minutes.   Overall, patient is critically ill, prognosis is guarded.  Patient with Multiorgan failure and at high risk for cardiac arrest and death.   Recommend DNR status and Palliative care consultation  Corrin Parker, M.D.  [facility]  Medical Director [place] Director [facility] Cardio-Pulmonary Department

## 2019-05-30 NOTE — Progress Notes (Signed)
[facility], [place] 05/20/2019  Subjective:   Hospital day # 1 Patient known to our practice from previous admissions and outpatient  Arrive to ER via ems for SOB x 3 days Had to be placed on ventilator yesterday for respiratory distress  Neuro: unresponsive today. Getting ready for CT head today Cvs:  Requiring vasopressors Pulm: pulm HTN- has not gone to [facility] yet. CXR with b/l infiltrates; ventilator dependent Renal: creatinine higher than baseline 12/07 0701 - 12/08 0700 In: 1400.2 [P.O.:120; I.V.:1280.2] Out: 850 [Urine:850] Lab Results  Component Value Date   CREATININE 3.34 (H) 05/07/2019   CREATININE 2.07 (H) 05/01/2019   CREATININE 2.11 (H) 05/04/2019     Objective:  Vital signs in last 24 hours:  Temp:  [97.9 F (36.6 C)-99.8 F (37.7 C)] 99 F (37.2 C) (12/08 0741) Pulse Rate:  [56-114] 61 (12/08 0741) Resp:  [18-31] 19 (12/08 0800) BP: (49-180)/(20-119) 106/87 (12/08 0800) SpO2:  [58 %-100 %] 100 % (12/08 0756) FiO2 (%):  [80 %-100 %] 80 % (12/08 0756) Weight:  [103.4 kg] 103.4 kg (12/08 0359)  Weight change: 6.745 kg Filed Weights   05/17/2019 0326 05/15/2019 0840 05/18/2019 0359  Weight: 95.3 kg 102 kg 103.4 kg    Intake/Output:    Intake/Output Summary (Last 24 hours) at 05/26/2019 0925 Last data filed at 05/23/2019 0829 Gross per 24 hour  Intake 1535.63 ml  Output 850 ml  Net 685.63 ml     Physical Exam: General: Critically ill appearing  HEENT Anicteric, pupils are dilated, no reaction to light  Pulm/lungs Ventilator assisted  CVS/Heart Tachycardic, regular  Abdomen:  Soft, NT  Extremities: +++ edema  Neurologic: Not responding; no sedation  Skin: No acute rashes   Foley in place       Basic Metabolic Panel:  Recent Labs  Lab 05/09/2019 0322 05/01/2019 0608 05/08/2019 0510  NA 140  --  141  K 4.2  --  5.9*  CL 105  --  104  CO2 22  --  14*  GLUCOSE 363*  --  107*  BUN 36*  --  44*  CREATININE 2.11* 2.07*  3.34*  CALCIUM 8.6*  --  8.3*  MG  --   --  2.3  PHOS  --   --  7.4*     CBC: Recent Labs  Lab 05/07/2019 0322 05/10/2019 0608 05/13/2019 0510  WBC 15.7* 19.2* 19.8*  NEUTROABS 12.2*  --   --   HGB 11.8* 11.2* 11.1*  HCT 39.7 38.9 38.2  MCV 84.6 86.4 87.4  PLT 268 242 209      Lab Results  Component Value Date   HEPBSAG NON REACTIVE 04/11/2019   HEPBSAB NON REACTIVE 04/11/2019   HEPBIGM NON REACTIVE 04/11/2019      Microbiology:  Recent Results (from the past 240 hour(s))  SARS Coronavirus 2 by RT PCR (hospital order, performed in [place] hospital lab) Nasopharyngeal Nasopharyngeal Swab     Status: None   Collection Time: 05/05/19  4:15 AM   Specimen: Nasopharyngeal Swab  Result Value Ref Range Status   SARS Coronavirus 2 NEGATIVE NEGATIVE Final    Comment: (NOTE) SARS-CoV-2 target nucleic acids are NOT DETECTED. The SARS-CoV-2 RNA is generally detectable in upper and lower respiratory specimens during the acute phase of infection. The lowest concentration of SARS-CoV-2 viral copies this assay can detect is 250 copies / mL. A negative result does not preclude SARS-CoV-2 infection and should not be used as the sole   basis for treatment or other patient management decisions.  A negative result may occur with improper specimen collection / handling, submission of specimen other than nasopharyngeal swab, presence of viral mutation(s) within the areas targeted by this assay, and inadequate number of viral copies (<250 copies / mL). A negative result must be combined with clinical observations, patient history, and epidemiological information. Fact Sheet for Patients:   StrictlyIdeas.no Fact Sheet for Healthcare Providers: BankingDealers.co.za This test is not yet approved or cleared  by the [place] FDA and has been authorized for detection and/or diagnosis of SARS-CoV-2 by FDA under an Emergency Use Authorization  (EUA).  This EUA will remain in effect (meaning this test can be used) for the duration of the COVID-19 declaration under Section 564(b)(1) of the Act, 21 U.S.C. section 360bbb-3(b)(1), unless the authorization is terminated or revoked sooner. Performed at [facility], [address] [place], [place] [address]   MRSA PCR Screening     Status: None   Collection Time: 05/23/2019  8:35 AM   Specimen: Nasal Mucosa; Nasopharyngeal  Result Value Ref Range Status   MRSA by PCR NEGATIVE NEGATIVE Final    Comment:        The GeneXpert MRSA Assay (FDA approved for NASAL specimens only), is one component of a comprehensive MRSA colonization surveillance program. It is not intended to diagnose MRSA infection nor to guide or monitor treatment for MRSA infections. Performed at [facility], [address] [place], [place] [address]     Coagulation Studies: No results for input(s): LABPROT, INR in the last 72 hours.  Urinalysis: No results for input(s): COLORURINE, LABSPEC, PHURINE, GLUCOSEU, HGBUR, BILIRUBINUR, KETONESUR, PROTEINUR, UROBILINOGEN, NITRITE, LEUKOCYTESUR in the last 72 hours.  Invalid input(s): APPERANCEUR    Imaging: Dg Chest 1 View  Result Date: 04/29/2019 CLINICAL DATA:  ETT placement EXAM: CHEST  1 VIEW COMPARISON:  05/15/2019 FINDINGS: Endotracheal tube with the tip 5.5 cm above the carina. Nasogastric tube coursing below the diaphragm. Left jugular central venous catheter with the tip projecting over the lower neck. Diffuse bilateral interstitial and alveolar airspace opacities. No pleural effusion. No pneumothorax. Stable cardiomediastinal silhouette. No aggressive osseous lesion. IMPRESSION: 1. Endotracheal tube with the tip 5.5 cm above the carina. 2. Nasogastric tube coursing below the diaphragm. 3. Left jugular central venous catheter with the tip projecting over the lower neck. 4. Diffuse bilateral interstitial and alveolar airspace opacities  most concerning for florid pulmonary edema. Electronically Signed   By: Kathreen Devoid   On: 04/29/2019 14:14   Dg Abd 1 View  Result Date: 05/02/2019 CLINICAL DATA:  Status post OG tube placement. EXAM: ABDOMEN - 1 VIEW COMPARISON:  None. FINDINGS: OG tube is in good position with both the tip and side-port in the stomach. IMPRESSION: As above. Electronically Signed   By: Inge Rise M.D.   On: 04/30/2019 14:16   Dg Chest Port 1 View  Result Date: 05/04/2019 CLINICAL DATA:  Central line placement EXAM: PORTABLE CHEST 1 VIEW COMPARISON:  05/10/2019 FINDINGS: The endotracheal tube terminates above the carina. There is a new left-sided central venous catheter with tip terminating over the expected region of the left brachiocephalic vein. The heart size remains stable. Diffuse bilateral hazy airspace opacities are again noted, with some interval improvement in the upper lung zones bilaterally. There is no pneumothorax. There are possible small bilateral pleural effusions. IMPRESSION: 1. Lines and tubes as above.  No pneumothorax. 2. There is some interval improvement in the hazy  bilateral airspace opacities, especially in the upper lung zones bilaterally. Electronically Signed   By: Christopher  Green M.D.   On: 05/17/2019 21:35   Dg Chest Portable 1 View  Result Date: 05/15/2019 CLINICAL DATA:  Respiratory distress. Shortness of breath for 3 hours. History of CHF. EXAM: PORTABLE CHEST 1 VIEW COMPARISON:  04/11/2019 FINDINGS: Cardiomegaly, increased from prior exam. Diffuse bilateral heterogeneous opacities. Possible small pleural effusions. No pneumothorax. No acute osseous abnormalities. IMPRESSION: 1. Cardiomegaly, increased from exam last month. 2. Diffuse heterogeneous bilateral opacities, may represent pulmonary edema, multifocal pneumonia, or combination thereof. Small pleural effusions. Overall findings favor CHF. Electronically Signed   By: Melanie  Sanford M.D.   On: 04/29/2019 03:45      Medications:   . DOPamine 20 mcg/kg/min (05/25/2019 0829)  . fentaNYL infusion INTRAVENOUS Stopped (05/04/2019 0745)  . furosemide (LASIX) infusion Stopped (05/18/2019 0734)  . niCARDipine Stopped (05/15/2019 1535)  . nitroGLYCERIN Stopped (05/14/2019 1247)  . norepinephrine (LEVOPHED) Adult infusion 5.333 mcg/min (05/22/2019 0853)  . propofol (DIPRIVAN) infusion Stopped (05/04/2019 0744)  . vasopressin (PITRESSIN) infusion - *FOR SHOCK* 0.03 Units/min (05/16/2019 0829)   . allopurinol  300 mg Oral Daily  . aspirin EC  81 mg Oral QPM  . atorvastatin  40 mg Oral QPM  . chlorhexidine gluconate (MEDLINE KIT)  15 mL Mouth Rinse BID  . Chlorhexidine Gluconate Cloth  6 each Topical Daily  . cloNIDine  0.3 mg Oral TID  . heparin  5,000 Units Subcutaneous Q8H  . hydrALAZINE  100 mg Oral TID  . influenza vaccine adjuvanted  0.5 mL Intramuscular Tomorrow-1000  . insulin aspart  0-15 Units Subcutaneous Q4H  . insulin detemir  15 Units Subcutaneous BID  . mouth rinse  15 mL Mouth Rinse 10 times per day  . metoprolol  200 mg Oral Daily  . pantoprazole (PROTONIX) IV  40 mg Intravenous Q24H   ALPRAZolam, colchicine, olopatadine, polyethylene glycol  Assessment/ Plan:  68 y.o.African American female  hypertension, diabetes mellitus type II, hyperlipidemia, depression, history of colon cancer, coronary artery disease, congestive heart failure with diastolic disfunction, right rotator cuff trauma, hyperaldosteronism who is admitted from ER for Volume overload AKI CKD level 3  1. 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  Chronic kidney disease secondary to diabetic nephropathy Acute renal failure due to acute cardiorenal syndrome.  Non oliguric  2. Hyperkalemia - will need shifting measures Consider lokelma    3.  Volume overload.  With LE edema Reports compliance with oral meds Last 2 D Echo (03/11/2019) = LVEF  50-55 %; LVH; Severe Pulmonary Hypertension  Repeat echo 12/7= LVEF hyperdynamic, severely enlarged right ventricle Pulmonary HTN-   evaluation at [facility] pulm HTN clinic is pending - if iv diuretic therapy fails, may need dialysis/CRRT for volume removal   4. Hypertension with diastolic dysfuction: Now hypotensive requiring pressors   5. Diabetes mellitus type II with chronic kidney disease: insulin dependent. Hemoglobin A1c of 7.2% on 03/11/2019 - hold metformin  6. Acute respiratory failure - Acute pulm edema and /or multifocal pneumonia - currently vent assisted. Intubated 05/14/2019 - COVID -19 neg 12/7  7. Altered mental status - concern for anoxic brain injury      LOS: 1 Harmeet Singh 12/8/20209:25 AM  [facility] [place], [place] [phone]  Note: This note was prepared with Dragon dictation. Any transcription errors are unintentional  

## 2019-05-30 NOTE — Progress Notes (Signed)
I came onto shift with pt already terminally extubated about 15 min prior to my arrival. Family was at bedside with chaplain, pt was comfortable on morphine drip. Pt ended up passing at [place]. Family was present at bedside. I notified cds and completed all appropriate documentation and post mortem protocol.

## 2019-05-30 NOTE — Death Summary Note (Signed)
DEATH SUMMARY   Patient Details  Name: Priscilla Villanueva MRN: [id number] DOB: 08-08-1950  Admission/Discharge Information   Admit Date:  16-May-2019  Date of Death: Date of Death: 05/17/19  Time of Death: Time of Death: 1921  Length of Stay: 1  Referring Physician: Tracie Harrier, MD   Reason(s) for Hospitalization  Acute Hypoxic Respiratory Failure Acute Diastolic CHF AKI Cardiorenal syndrome Cardiogenic shock Acute CVA  Diagnoses  Preliminary cause of death:   Acute Hypoxic Respiratory Failure Secondary Diagnoses (including complications and co-morbidities):  Active Problems:   Hypertensive emergency Acute Diastolic CHF AKI Cardiorenal syndrome Cardiogenic shock Acute CVA   Brief Hospital Course (including significant findings, care, treatment, and services provided and events leading to death)  Priscilla Villanueva is a 69 y.o. year old female who presented to [facility] ED on 05-16-2019 with complaints of shortness of breath.  She was found to be hypoxic. CXR was concerning for pulmonary edema.  She was admitted to ICU for Acute Hypoxic Respiratory Failure secondary to pulmonary edema, Acute HFpEF, AKI, Cardiorenal syndrome, and cardiogenic shock.  She was placed on BiPAP, but ultimately required intubation on 05/16/23. She also required vasopressors. Nephrology was consulted for consideration of Hemodialysis vs. CRRT.  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  Neurology consulted.  Given her multiorgan failure and acute stroke, the patient's family decided to withdraw care and transition to comfort measures on 2023/05/17.    Pertinent Labs and Studies  Significant Diagnostic Studies Dg Chest 1 View  Result Date: 2019-05-16 CLINICAL DATA:  ETT placement EXAM: CHEST  1 VIEW COMPARISON:  16-May-2019 FINDINGS: Endotracheal tube with the tip 5.5 cm above the carina. Nasogastric tube coursing below the diaphragm. Left jugular central  venous catheter with the tip projecting over the lower neck. Diffuse bilateral interstitial and alveolar airspace opacities. No pleural effusion. No pneumothorax. Stable cardiomediastinal silhouette. No aggressive osseous lesion. IMPRESSION: 1. Endotracheal tube with the tip 5.5 cm above the carina. 2. Nasogastric tube coursing below the diaphragm. 3. Left jugular central venous catheter with the tip projecting over the lower neck. 4. Diffuse bilateral interstitial and alveolar airspace opacities most concerning for florid pulmonary edema. Electronically Signed   By: Kathreen Devoid   On: May 16, 2019 14:14   Dg Chest 2 View  Result Date: 04/11/2019 CLINICAL DATA:  Pulmonary edema EXAM: CHEST - 2 VIEW COMPARISON:  03/11/2019 FINDINGS: Cardiomegaly. Mild, diffuse bilateral interstitial pulmonary opacity and trace pleural effusions. The visualized skeletal structures are unremarkable. IMPRESSION: 1. Mild, diffuse bilateral interstitial pulmonary opacity and trace pleural effusions, findings consistent with edema. No focal airspace opacity. 2.  Cardiomegaly. Electronically Signed   By: Eddie Candle M.D.   On: 04/11/2019 12:51   Dg Abd 1 View  Result Date: May 16, 2019 CLINICAL DATA:  Status post OG tube placement. EXAM: ABDOMEN - 1 VIEW COMPARISON:  None. FINDINGS: OG tube is in good position with both the tip and side-port in the stomach. IMPRESSION: As above. Electronically Signed   By: Inge Rise M.D.   On: 05-16-19 14:16   Ct Head Wo Contrast  Result Date: 05-17-2019 CLINICAL DATA:  Patient admitted yesterday with shortness of breath for 3 days. The patient is unresponsive today. EXAM: CT HEAD WITHOUT CONTRAST TECHNIQUE: Contiguous axial images were obtained from the base of the skull through the vertex without intravenous contrast. COMPARISON:  Brain MRI 02/25/2019. FINDINGS: Brain: There is hypoattenuation in the left cerebral hemisphere along  the falx most consistent with an ACA territory infarct.  More subtle hypoattenuation is seen in the high right parietal and frontal lobes worrisome for infarct. No hemorrhage, midline shift, hydrocephalus or mass. Vascular: Atherosclerosis.  No hyperdense vessel is identified. Skull: Intact.  No focal lesion. Sinuses/Orbits: Negative. Other: None. IMPRESSION: Findings most consistent with a left ACA territory infarct. More subtle findings worrisome for infarct are seen in the high right frontal and parietal lobes. Negative for hemorrhage or hydrocephalus. These results were called by telephone at the time of interpretation on 05/31/2019 at 10:02 am to provider Sherre Lain, RN, who verbally acknowledged these results. Electronically Signed   By: Inge Rise M.D.   On: 05/31/19 10:04   Dg Chest Port 1 View  Result Date: 05/17/2019 CLINICAL DATA:  Central line placement EXAM: PORTABLE CHEST 1 VIEW COMPARISON:  05/22/2019 FINDINGS: The endotracheal tube terminates above the carina. There is a new left-sided central venous catheter with tip terminating over the expected region of the left brachiocephalic vein. The heart size remains stable. Diffuse bilateral hazy airspace opacities are again noted, with some interval improvement in the upper lung zones bilaterally. There is no pneumothorax. There are possible small bilateral pleural effusions. IMPRESSION: 1. Lines and tubes as above.  No pneumothorax. 2. There is some interval improvement in the hazy bilateral airspace opacities, especially in the upper lung zones bilaterally. Electronically Signed   By: Constance Holster M.D.   On: 05/20/2019 21:35   Dg Chest Portable 1 View  Result Date: 04/29/2019 CLINICAL DATA:  Respiratory distress. Shortness of breath for 3 hours. History of CHF. EXAM: PORTABLE CHEST 1 VIEW COMPARISON:  04/11/2019 FINDINGS: Cardiomegaly, increased from prior exam. Diffuse bilateral heterogeneous opacities. Possible small pleural effusions. No pneumothorax. No acute osseous abnormalities.  IMPRESSION: 1. Cardiomegaly, increased from exam last month. 2. Diffuse heterogeneous bilateral opacities, may represent pulmonary edema, multifocal pneumonia, or combination thereof. Small pleural effusions. Overall findings favor CHF. Electronically Signed   By: Keith Rake M.D.   On: 05/11/2019 03:45    Microbiology Recent Results (from the past 240 hour(s))  SARS Coronavirus 2 by RT PCR (hospital order, performed in [facility] hospital lab) Nasopharyngeal Nasopharyngeal Swab     Status: None   Collection Time: 05/09/2019  4:15 AM   Specimen: Nasopharyngeal Swab  Result Value Ref Range Status   SARS Coronavirus 2 NEGATIVE NEGATIVE Final    Comment: (NOTE) SARS-CoV-2 target nucleic acids are NOT DETECTED. The SARS-CoV-2 RNA is generally detectable in upper and lower respiratory specimens during the acute phase of infection. The lowest concentration of SARS-CoV-2 viral copies this assay can detect is 250 copies / mL. A negative result does not preclude SARS-CoV-2 infection and should not be used as the sole basis for treatment or other patient management decisions.  A negative result may occur with improper specimen collection / handling, submission of specimen other than nasopharyngeal swab, presence of viral mutation(s) within the areas targeted by this assay, and inadequate number of viral copies (<250 copies / mL). A negative result must be combined with clinical observations, patient history, and epidemiological information. Fact Sheet for Patients:   StrictlyIdeas.no Fact Sheet for Healthcare Providers: BankingDealers.co.za This test is not yet approved or cleared  by the [place] FDA and has been authorized for detection and/or diagnosis of SARS-CoV-2 by FDA under an Emergency Use Authorization (EUA).  This EUA will remain in effect (meaning this test can be used) for the duration of the COVID-19 declaration  under Section  564(b)(1) of the Act, 21 U.S.C. section 360bbb-3(b)(1), unless the authorization is terminated or revoked sooner. Performed at [facility], [address] [place], [place] [address]   MRSA PCR Screening     Status: None   Collection Time: 05/17/2019  8:35 AM   Specimen: Nasal Mucosa; Nasopharyngeal  Result Value Ref Range Status   MRSA by PCR NEGATIVE NEGATIVE Final    Comment:        The GeneXpert MRSA Assay (FDA approved for NASAL specimens only), is one component of a comprehensive MRSA colonization surveillance program. It is not intended to diagnose MRSA infection nor to guide or monitor treatment for MRSA infections. Performed at [facility], [address] [place], [place] [address]     Lab Basic Metabolic Panel: Recent Labs  Lab 05/10/2019 0322 05/16/2019 0608 2019/05/14 0510  NA 140  --  141  K 4.2  --  5.9*  CL 105  --  104  CO2 22  --  14*  GLUCOSE 363*  --  107*  BUN 36*  --  44*  CREATININE 2.11* 2.07* 3.34*  CALCIUM 8.6*  --  8.3*  MG  --   --  2.3  PHOS  --   --  7.4*   Liver Function Tests: Recent Labs  Lab 05/13/2019 0322  AST 35  ALT 31  ALKPHOS 108  BILITOT 1.0  PROT 6.9  ALBUMIN 3.6   No results for input(s): LIPASE, AMYLASE in the last 168 hours. No results for input(s): AMMONIA in the last 168 hours. CBC: Recent Labs  Lab 05/24/2019 0322 05/20/2019 0608 2019/05/14 0510  WBC 15.7* 19.2* 19.8*  NEUTROABS 12.2*  --   --   HGB 11.8* 11.2* 11.1*  HCT 39.7 38.9 38.2  MCV 84.6 86.4 87.4  PLT 268 242 209   Cardiac Enzymes: No results for input(s): CKTOTAL, CKMB, CKMBINDEX, TROPONINI in the last 168 hours. Sepsis Labs: Recent Labs  Lab 05/24/2019 0322 05/27/2019 0607 05/10/2019 0608 04/29/2019 0730 05/14/2019 0510  PROCALCITON  --   --   --  <0.10  --   WBC 15.7*  --  19.2*  --  19.8*  LATICACIDVEN 4.6* 1.6  --   --   --     Procedures/Operations  Endotracheal intubation 12/7 Left IJ Hemodialysis catheter insertion  12/7      Darel Hong, [facility] [place] [facility] Pager: [phone]  Bradly Bienenstock May 14, 2019, 8:11 PM

## 2019-05-30 NOTE — Progress Notes (Signed)
   05-08-2019 1600  Clinical Encounter Type  Visited With Family  Visit Type Initial;Spiritual support;Critical Care  Referral From Nurse  Consult/Referral To Chaplain  Spiritual Encounters  Spiritual Needs Prayer;Emotional;Grief support  Stress Factors  Family Stress Factors Major life changes  Chaplain received referral to visit with patient's daughter in law Lisabeth Pick) . Chaplain made pastoral presence know with empathy, active listening and prayer. Daughter in law Lisabeth Pick) was able to talk with doctor concerning patient's diagnose and doctor was very clear on what was happening with patient . Lisabeth Pick presented the documentation for the patient's wish and the family was called in. Chaplain intoroduced this patient's family to next Chaplain arriving on call to maintain pastoral presence.

## 2019-05-30 NOTE — Progress Notes (Signed)
Family At bedside, clinical status relayed to family I have discussed with both HCPOA's Alfreda and Artrina. They understand her grave prognosis  Updated and notified of patients medical condition-  Progressive multiorgan failure with very low chance of meaningful recovery.  Patient is in dying  Process.  Family understands the situation.  They have consented and agreed to DNR/DNI and would like to proceed with Comfort care measures.  All family is to arrive at bedside to withdraw medical care   Family are satisfied with Plan of action and management. All questions answered  Corrin Parker, M.D.  [facility]  Medical Director [place] Director [facility] Cardio-Pulmonary Department

## 2019-05-30 NOTE — Progress Notes (Signed)
Spiritual care provided before, during and after extubation. Family (Friend, DIL, grandchildren, and significant other) present. Chaplain provided prayers, music, EOL education and hospitality to family. Family engaged in grief well through offering [name] words of encouragement, providing comfort touch, and music. Family provided [name] as service of choice. Chaplain walked family out of hospital. (3 hours of care provided). Family verbally thankful for care received and appeared to be working well together to follow Priscilla Villanueva's wishes.     05/28/19 2000  Clinical Encounter Type  Visited With Patient and family together  Visit Type Spiritual support;Critical Care;Death  Referral From Nurse  Spiritual Encounters  Spiritual Needs Prayer;Grief support;Emotional

## 2019-05-30 NOTE — Progress Notes (Signed)
CBG: 48  Treatment: 81mL D50 IV push  CBG: 70   Patient is unresponsive to painful stiluli. Pupils fixed and dilated. No gag or cough reflex. ICU MD notified. ICU MD ordered head CT. Will continue to monitor. BP low. Began Vaso and Levo this AM. Family updated via telephone.

## 2019-05-30 NOTE — Consult Note (Signed)
Reason for Consult: AMS Referring Physician: Dr. Duwayne Heck   CC: Stroke   HPI: Priscilla Villanueva is an 69 y.o. female with a history of CAD CHF CKD diabetes, PULM hypertensionwho comes to the ED complaining of shortness of breath that started acutely yesterdayt. EMS report an initial oxygen saturation of 75% on 4 L nasal cannula when they arrived. She was admitted to ICU for severe respf failure due to hypoxia along with cariogenic shock on ventilator support. Currently on multiple pressors and not following commands.     Past Medical History:  Diagnosis Date  . CAD (coronary artery disease)   . CHF (congestive heart failure) ([place])   . Colon cancer ([place]) 2009   with chemotherapy  . Colon cancer ([place]) 11/26/2014  . Depression   . Diabetes mellitus without complication ([place])   . Hypercholesteremia   . Hypertension   . Obesity   . Personal history of chemotherapy 2009   colon ca    Past Surgical History:  Procedure Laterality Date  . ABDOMINAL HYSTERECTOMY     PARTIAL  . COLECTOMY    . COLONOSCOPY WITH PROPOFOL N/A 07/16/2017   Procedure: COLONOSCOPY WITH PROPOFOL;  Surgeon: Lollie Sails, MD;  Location: [facility] ENDOSCOPY;  Service: Endoscopy;  Laterality: N/A;  . PERIPHERAL VASCULAR CATHETERIZATION N/A 01/26/2015   Procedure: Glori Luis Cath Removal;  Surgeon: Katha Cabal, MD;  Location: [place] CV LAB;  Service: Cardiovascular;  Laterality: N/A;  . RIGHT HEART CATH AND CORONARY ANGIOGRAPHY Right 04/02/2019   Procedure: RIGHT HEART CATH AND CORONARY ANGIOGRAPHY;  Surgeon: Yolonda Kida, MD;  Location: [place] [facility] LAB;  Service: Cardiovascular;  Laterality: Right;  . TEMPORARY DIALYSIS CATHETER N/A 04/11/2019   Procedure: TEMPORARY DIALYSIS CATHETER;  Surgeon: Algernon Huxley, MD;  Location: [place] [facility] LAB;  Service: Cardiovascular;  Laterality: N/A;  . UPPER GI ENDOSCOPY      Family History  Problem Relation Age of Onset  . Colon cancer Mother        "had  some type of tumor in the vulva liver unknown primary"  . Colon cancer Brother        "died at young age"  . Breast cancer Neg Hx     Social History:  reports that she has quit smoking. She has a 3.00 pack-year smoking history. She has never used smokeless tobacco. She reports current alcohol use of about 1.0 standard drinks of alcohol per week. She reports that she does not use drugs.  Allergies  Allergen Reactions  . Amlodipine Swelling    Causes pt's feet and legs to swell...  . Hydrochlorothiazide Other (See Comments)    Headaches.  . Lisinopril Swelling and Cough  . Sitagliptin Diarrhea  . Zoloft [Sertraline] Other (See Comments)    Unsure of exact reaction    Medications: I have reviewed the patient's current medications.  ROS: Unable to obtain as intubated.   Physical Examination: Blood pressure (!) 101/47, pulse (!) 103, temperature 98.1 F (36.7 C), temperature source Tympanic, resp. rate (!) 30, height 5\' 4"  (1.626 m), weight 103.4 kg, SpO2 99 %.  Pupils are 1 mm and fixes No corneal's Not following commands.  No withdrawal of painful stimuli  Laboratory Studies:   Basic Metabolic Panel: Recent Labs  Lab 05/13/2019 0322 05/01/2019 0608 May 11, 2019 0510  NA 140  --  141  K 4.2  --  5.9*  CL 105  --  104  CO2 22  --  14*  GLUCOSE  363*  --  107*  BUN 36*  --  44*  CREATININE 2.11* 2.07* 3.34*  CALCIUM 8.6*  --  8.3*  MG  --   --  2.3  PHOS  --   --  7.4*    Liver Function Tests: Recent Labs  Lab 05/19/2019 0322  AST 35  ALT 31  ALKPHOS 108  BILITOT 1.0  PROT 6.9  ALBUMIN 3.6   No results for input(s): LIPASE, AMYLASE in the last 168 hours. No results for input(s): AMMONIA in the last 168 hours.  CBC: Recent Labs  Lab 05/04/2019 0322 05/22/2019 0608 05/15/19 0510  WBC 15.7* 19.2* 19.8*  NEUTROABS 12.2*  --   --   HGB 11.8* 11.2* 11.1*  HCT 39.7 38.9 38.2  MCV 84.6 86.4 87.4  PLT 268 242 209    Cardiac Enzymes: No results for input(s):  CKTOTAL, CKMB, CKMBINDEX, TROPONINI in the last 168 hours.  BNP: Invalid input(s): POCBNP  CBG: Recent Labs  Lab 2019-05-15 0041 05/15/19 0407 05/15/2019 0729 15-May-2019 0748 2019/05/15 1126  GLUCAP 208* 116* 48* 70 51    Microbiology: Results for orders placed or performed during the hospital encounter of 05/18/2019  SARS Coronavirus 2 by RT PCR (hospital order, performed in [facility] hospital lab) Nasopharyngeal Nasopharyngeal Swab     Status: None   Collection Time: 05/23/2019  4:15 AM   Specimen: Nasopharyngeal Swab  Result Value Ref Range Status   SARS Coronavirus 2 NEGATIVE NEGATIVE Final    Comment: (NOTE) SARS-CoV-2 target nucleic acids are NOT DETECTED. The SARS-CoV-2 RNA is generally detectable in upper and lower respiratory specimens during the acute phase of infection. The lowest concentration of SARS-CoV-2 viral copies this assay can detect is 250 copies / mL. A negative result does not preclude SARS-CoV-2 infection and should not be used as the sole basis for treatment or other patient management decisions.  A negative result may occur with improper specimen collection / handling, submission of specimen other than nasopharyngeal swab, presence of viral mutation(s) within the areas targeted by this assay, and inadequate number of viral copies (<250 copies / mL). A negative result must be combined with clinical observations, patient history, and epidemiological information. Fact Sheet for Patients:   StrictlyIdeas.no Fact Sheet for Healthcare Providers: BankingDealers.co.za This test is not yet approved or cleared  by the [place] FDA and has been authorized for detection and/or diagnosis of SARS-CoV-2 by FDA under an Emergency Use Authorization (EUA).  This EUA will remain in effect (meaning this test can be used) for the duration of the COVID-19 declaration under Section 564(b)(1) of the Act, 21 U.S.C. section  360bbb-3(b)(1), unless the authorization is terminated or revoked sooner. Performed at [facility], [address] [place], [place] [address]   MRSA PCR Screening     Status: None   Collection Time: 05/26/2019  8:35 AM   Specimen: Nasal Mucosa; Nasopharyngeal  Result Value Ref Range Status   MRSA by PCR NEGATIVE NEGATIVE Final    Comment:        The GeneXpert MRSA Assay (FDA approved for NASAL specimens only), is one component of a comprehensive MRSA colonization surveillance program. It is not intended to diagnose MRSA infection nor to guide or monitor treatment for MRSA infections. Performed at [facility], [address] [place], [place] [address]     Coagulation Studies: No results for input(s): LABPROT, INR in the last 72 hours.  Urinalysis: No results for input(s): COLORURINE, LABSPEC, [place],  GLUCOSEU, HGBUR, BILIRUBINUR, KETONESUR, PROTEINUR, UROBILINOGEN, NITRITE, LEUKOCYTESUR in the last 168 hours.  Invalid input(s): APPERANCEUR  Lipid Panel:     Component Value Date/Time   CHOL 178 02/06/2013 0609   TRIG 109 05/17/2019 1524   TRIG 200 02/06/2013 0609   HDL 47 02/06/2013 0609   VLDL 40 02/06/2013 0609   LDLCALC 91 02/06/2013 0609    HgbA1C:  Lab Results  Component Value Date   HGBA1C 7.2 (H) 03/11/2019    Urine Drug Screen:  No results found for: LABOPIA, COCAINSCRNUR, LABBENZ, AMPHETMU, THCU, LABBARB  Alcohol Level: No results for input(s): ETH in the last 168 hours.  Imaging: Dg Chest 1 View  Result Date: 05/10/2019 CLINICAL DATA:  ETT placement EXAM: CHEST  1 VIEW COMPARISON:  05/24/2019 FINDINGS: Endotracheal tube with the tip 5.5 cm above the carina. Nasogastric tube coursing below the diaphragm. Left jugular central venous catheter with the tip projecting over the lower neck. Diffuse bilateral interstitial and alveolar airspace opacities. No pleural effusion. No pneumothorax. Stable cardiomediastinal silhouette. No  aggressive osseous lesion. IMPRESSION: 1. Endotracheal tube with the tip 5.5 cm above the carina. 2. Nasogastric tube coursing below the diaphragm. 3. Left jugular central venous catheter with the tip projecting over the lower neck. 4. Diffuse bilateral interstitial and alveolar airspace opacities most concerning for florid pulmonary edema. Electronically Signed   By: Kathreen Devoid   On: 05/22/2019 14:14   Dg Abd 1 View  Result Date: 05/11/2019 CLINICAL DATA:  Status post OG tube placement. EXAM: ABDOMEN - 1 VIEW COMPARISON:  None. FINDINGS: OG tube is in good position with both the tip and side-port in the stomach. IMPRESSION: As above. Electronically Signed   By: Inge Rise M.D.   On: 05/21/2019 14:16   Ct Head Wo Contrast  Result Date: 05-11-19 CLINICAL DATA:  Patient admitted yesterday with shortness of breath for 3 days. The patient is unresponsive today. EXAM: CT HEAD WITHOUT CONTRAST TECHNIQUE: Contiguous axial images were obtained from the base of the skull through the vertex without intravenous contrast. COMPARISON:  Brain MRI 02/25/2019. FINDINGS: Brain: There is hypoattenuation in the left cerebral hemisphere along the falx most consistent with an ACA territory infarct. More subtle hypoattenuation is seen in the high right parietal and frontal lobes worrisome for infarct. No hemorrhage, midline shift, hydrocephalus or mass. Vascular: Atherosclerosis.  No hyperdense vessel is identified. Skull: Intact.  No focal lesion. Sinuses/Orbits: Negative. Other: None. IMPRESSION: Findings most consistent with a left ACA territory infarct. More subtle findings worrisome for infarct are seen in the high right frontal and parietal lobes. Negative for hemorrhage or hydrocephalus. These results were called by telephone at the time of interpretation on 05-11-19 at 10:02 am to provider Sherre Lain, RN, who verbally acknowledged these results. Electronically Signed   By: Inge Rise M.D.   On:  11-May-2019 10:04   Dg Chest Port 1 View  Result Date: 05/05/2019 CLINICAL DATA:  Central line placement EXAM: PORTABLE CHEST 1 VIEW COMPARISON:  05/05/2019 FINDINGS: The endotracheal tube terminates above the carina. There is a new left-sided central venous catheter with tip terminating over the expected region of the left brachiocephalic vein. The heart size remains stable. Diffuse bilateral hazy airspace opacities are again noted, with some interval improvement in the upper lung zones bilaterally. There is no pneumothorax. There are possible small bilateral pleural effusions. IMPRESSION: 1. Lines and tubes as above.  No pneumothorax. 2. There is some interval improvement in the hazy bilateral airspace opacities,  especially in the upper lung zones bilaterally. Electronically Signed   By: Constance Holster M.D.   On: 05/03/2019 21:35   Dg Chest Portable 1 View  Result Date: 05/03/2019 CLINICAL DATA:  Respiratory distress. Shortness of breath for 3 hours. History of CHF. EXAM: PORTABLE CHEST 1 VIEW COMPARISON:  04/11/2019 FINDINGS: Cardiomegaly, increased from prior exam. Diffuse bilateral heterogeneous opacities. Possible small pleural effusions. No pneumothorax. No acute osseous abnormalities. IMPRESSION: 1. Cardiomegaly, increased from exam last month. 2. Diffuse heterogeneous bilateral opacities, may represent pulmonary edema, multifocal pneumonia, or combination thereof. Small pleural effusions. Overall findings favor CHF. Electronically Signed   By: Keith Rake M.D.   On: 05/21/2019 03:45     Assessment/Plan:  70 y.o. female with a history of CAD CHF CKD diabetes, PULM hypertensionwho comes to the ED complaining of shortness of breath that started acutely yesterdayt. EMS report an initial oxygen saturation of 75% on 4 L nasal cannula when they arrived. She was admitted to ICU for severe respf failure due to hypoxia along with cariogenic shock on ventilator support. Currently on multiple  pressors and not following commands.    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  Strokes likely in setting of hypotension and CHF Very poor examination with pinpoint, not responsive pupils, no withdrawal to pain From this examination suspect more ischemia/anoxia that we don't see on the current [facility] yet Family/code status discussions If aggressive care would like to repeat [facility] and preferably MRI if BP is stable to look at brainstem as suspect will see ischemia in the brainstem Likely poor prognosticator given the current examination/progression 05-16-2019, 2:12 PM

## 2019-05-30 DEATH — deceased

## 2019-06-09 ENCOUNTER — Ambulatory Visit: Payer: Medicare Other | Admitting: Family

## 2020-12-05 IMAGING — MG DIGITAL SCREENING BILATERAL MAMMOGRAM WITH TOMO AND CAD
6 of 12 series · 6 of 36 positions shown · non-contrast
Comparison: Previous exam(s).

CLINICAL DATA: Screening.

EXAM:
DIGITAL SCREENING BILATERAL MAMMOGRAM WITH TOMO AND CAD

[L MLO synth-2D (1 of 2)]
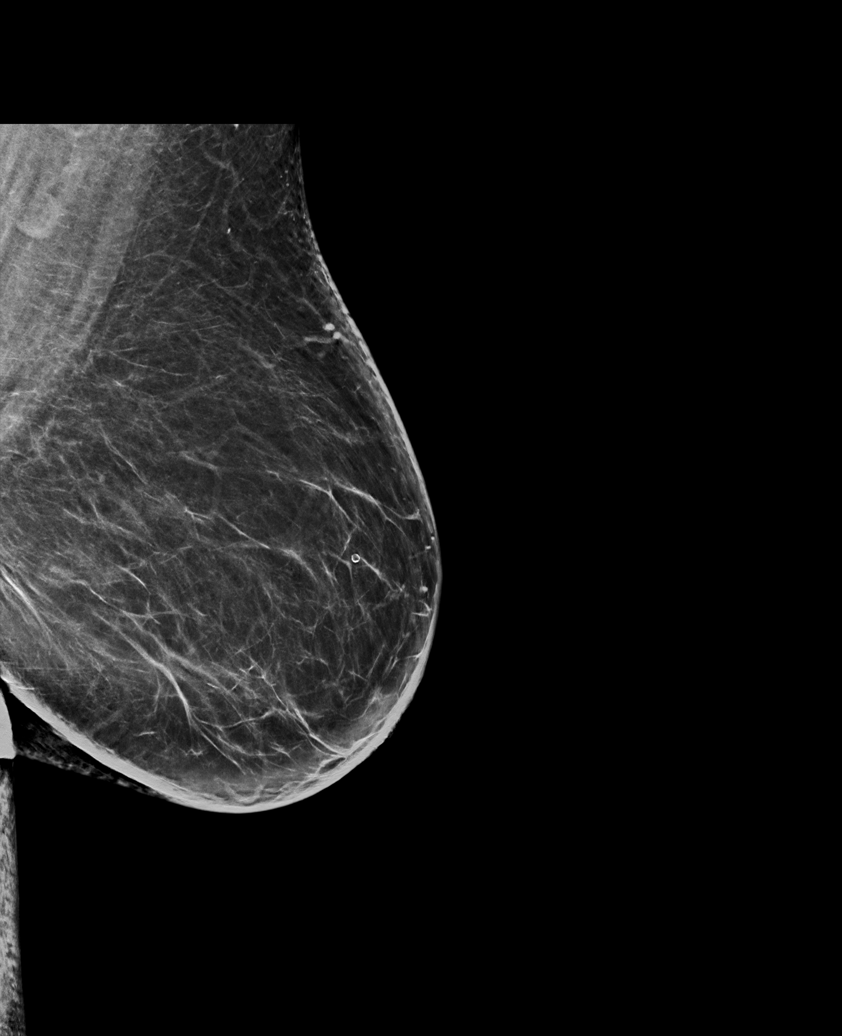

[R MLO synth-2D]
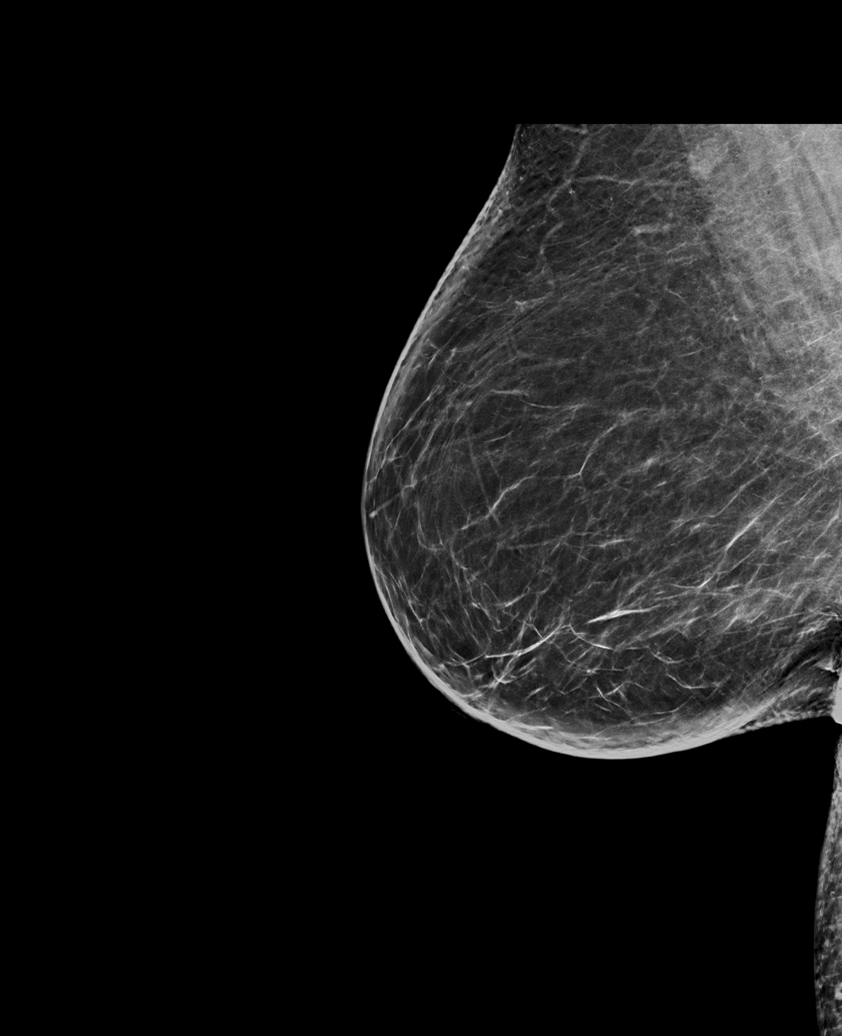

[L CC synth-2D]
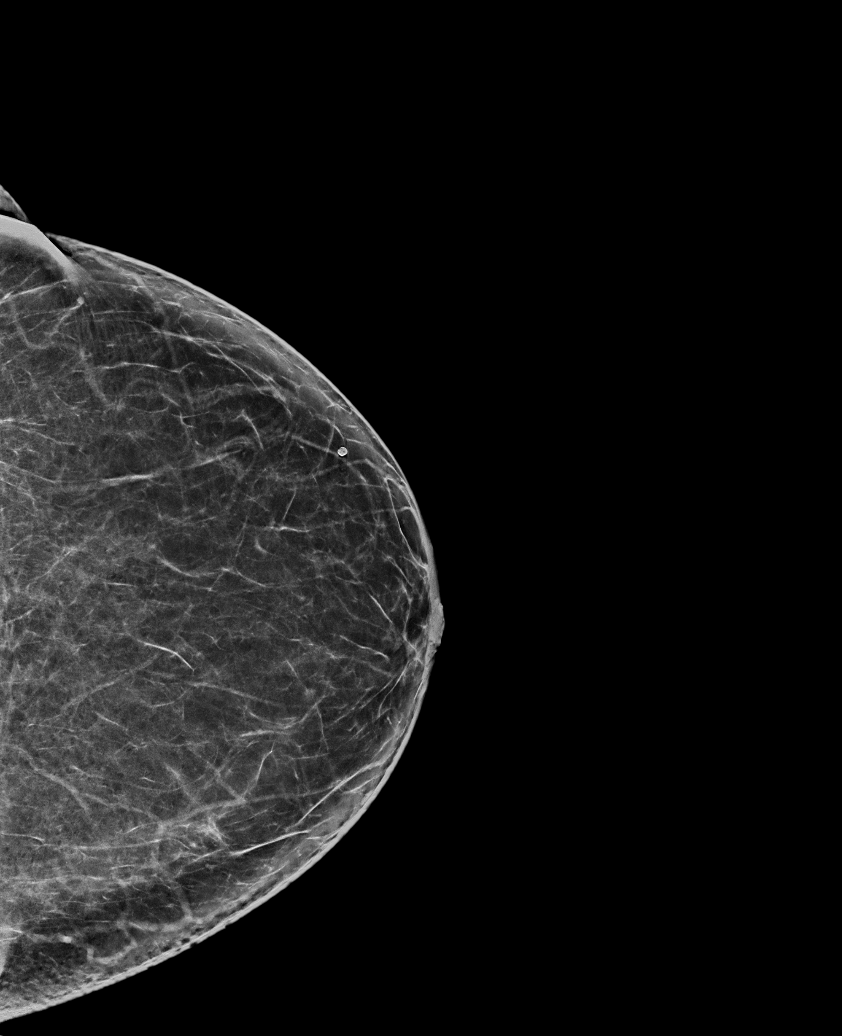

[L MLO synth-2D (2 of 2)]
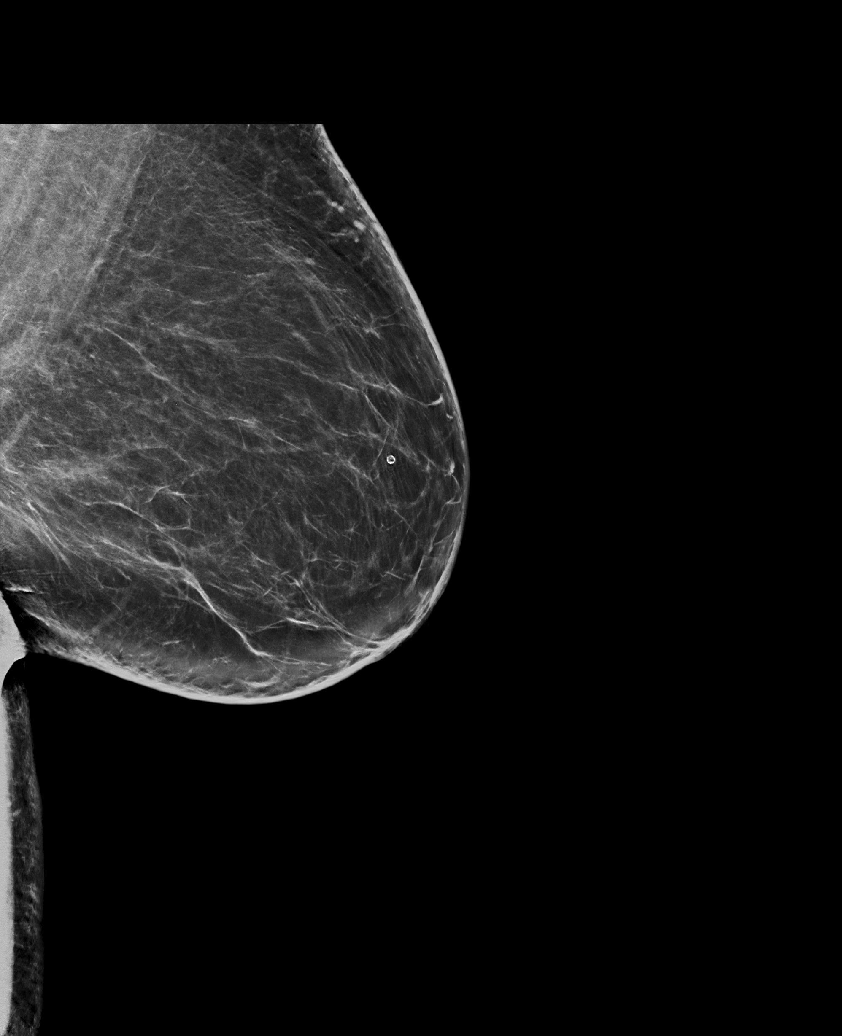

[R CC synth-2D (1 of 2)]
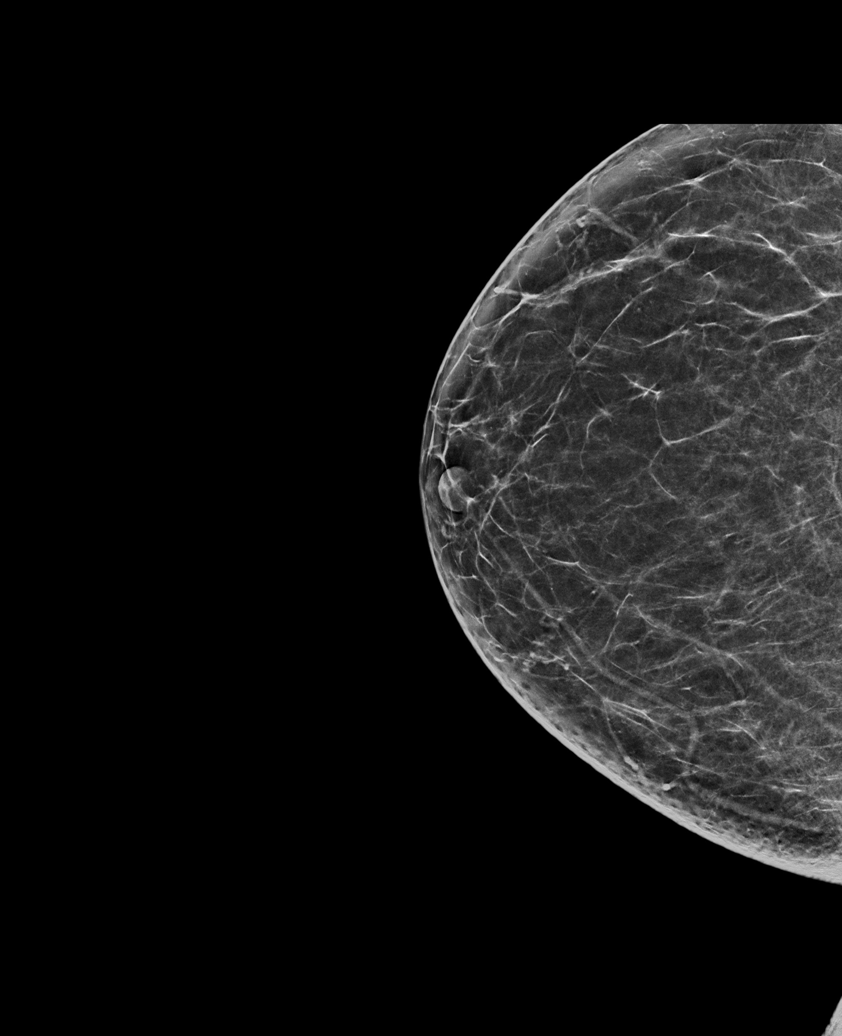

[R CC synth-2D (2 of 2)]
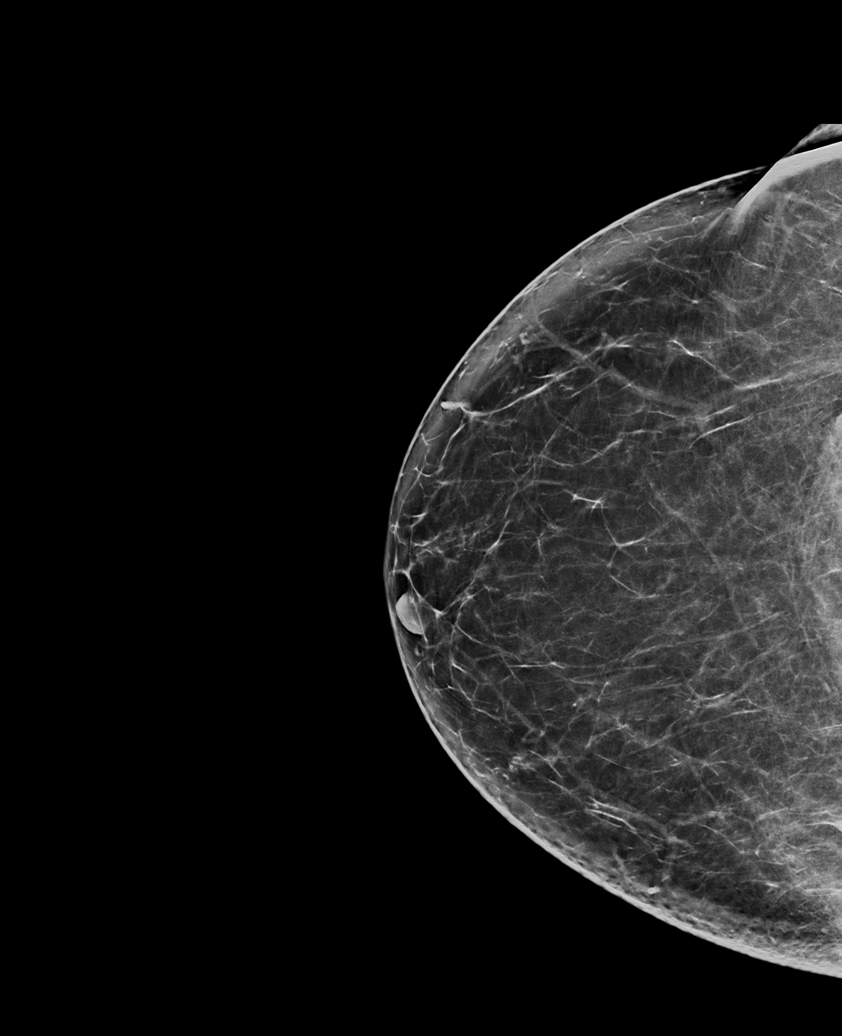

[6 of 36 positions shown; findings below may reference images not displayed]

ACR Breast Density Category b: There are scattered areas of
fibroglandular density.
FINDINGS: There are no findings suspicious for malignancy. Images were
processed with CAD.
IMPRESSION: No mammographic evidence of malignancy. A result letter of this
screening mammogram will be mailed directly to the patient.

RECOMMENDATION:
Screening mammogram in one year. (Code:[id number])

BI-RADS CATEGORY  1: Negative.

## 2021-04-14 IMAGING — DX DG CHEST 1V PORT
1 series · 1 of 1 positions shown · non-contrast
Comparison: Radiograph 09/16/2017 CT 09/14/2011

CLINICAL DATA: Shortness of breath worsening for 1 week

EXAM:
PORTABLE CHEST 1 VIEW

[chest ap]
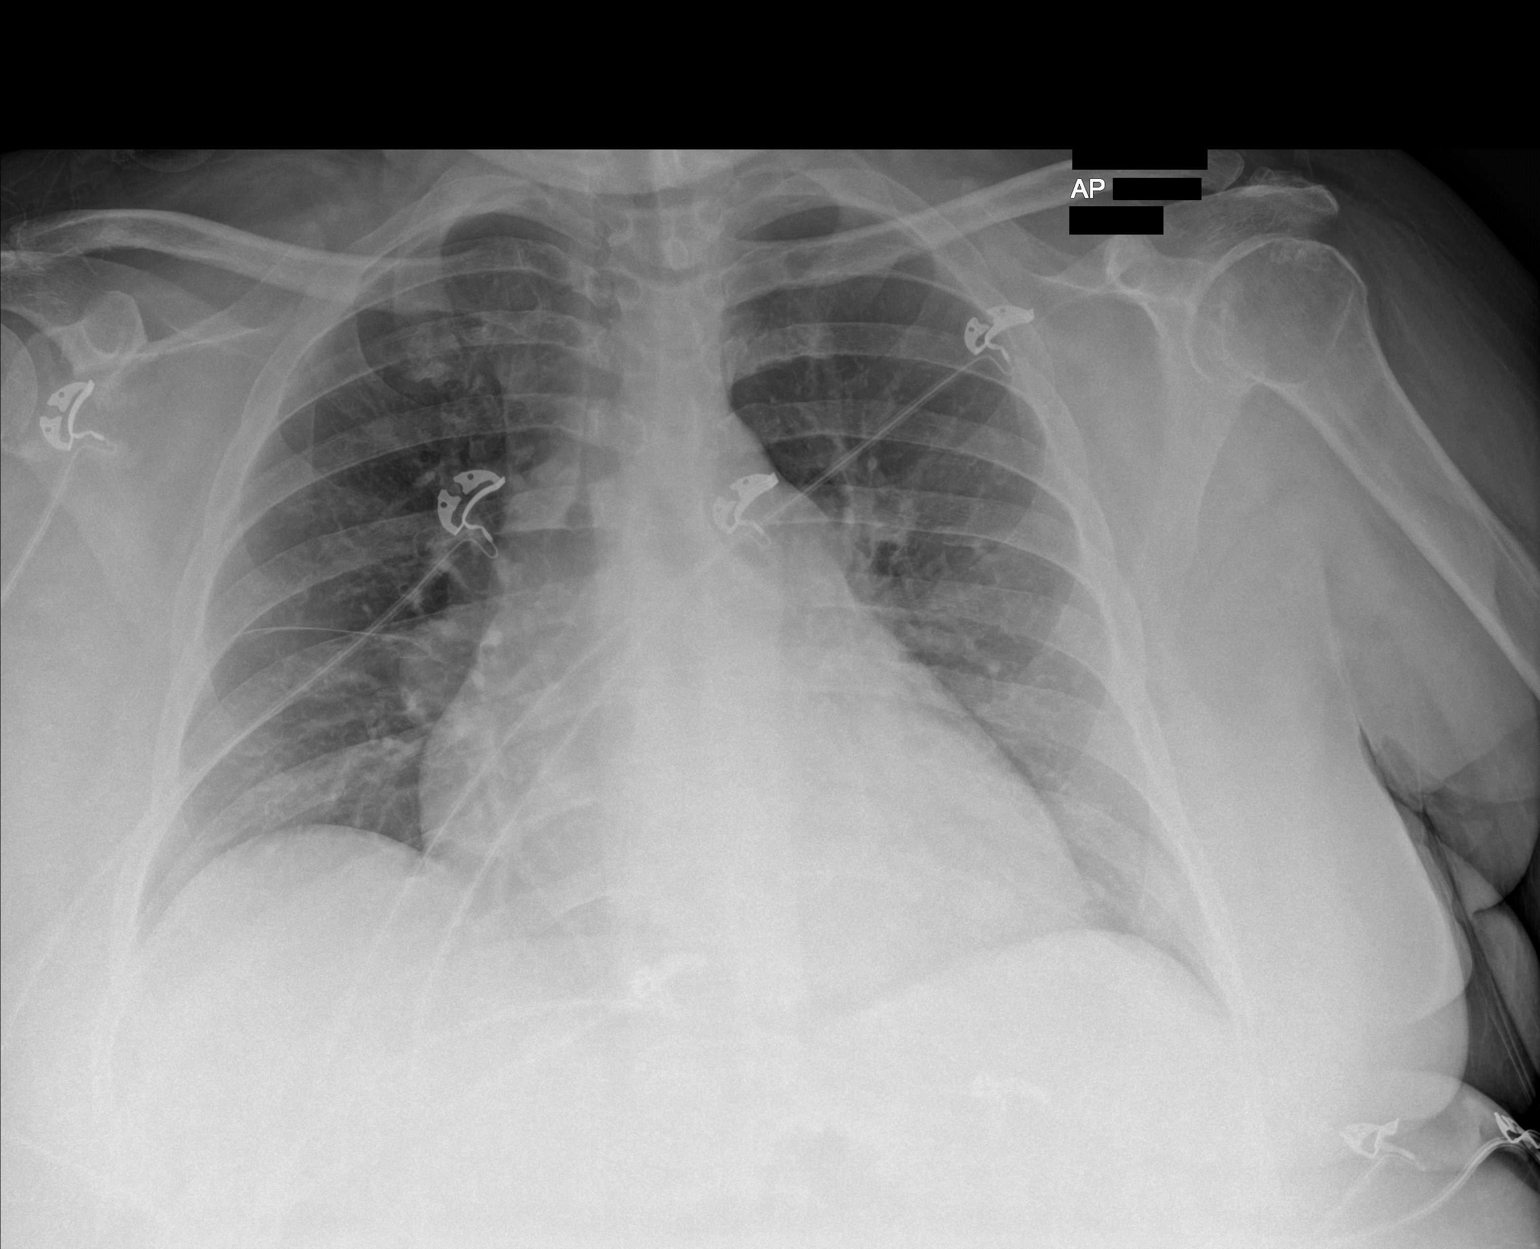

[1 of 1 positions shown; findings below may reference images not displayed]

FINDINGS: No consolidation, features of edema, pneumothorax, or effusion.
Pulmonary vascularity is normally distributed. Prominence of the
cardiac silhouette is likely related to the portable technique. No
acute osseous or soft tissue abnormality.
IMPRESSION: No acute cardiopulmonary disease.
